# Patient Record
Sex: Female | Born: 1940 | Race: Black or African American | Hispanic: No | Smoking: Never smoker
Health system: Southern US, Community
[De-identification: ages and names within clinical notes are randomized; demographics above are authoritative.]

## PROBLEM LIST (undated history)

## (undated) DIAGNOSIS — F4310 Post-traumatic stress disorder, unspecified: Secondary | ICD-10-CM

## (undated) DIAGNOSIS — I10 Essential (primary) hypertension: Secondary | ICD-10-CM

## (undated) DIAGNOSIS — E785 Hyperlipidemia, unspecified: Secondary | ICD-10-CM

## (undated) DIAGNOSIS — J45909 Unspecified asthma, uncomplicated: Secondary | ICD-10-CM

## (undated) DIAGNOSIS — J449 Chronic obstructive pulmonary disease, unspecified: Secondary | ICD-10-CM

## (undated) DIAGNOSIS — F32A Depression, unspecified: Secondary | ICD-10-CM

## (undated) DIAGNOSIS — F329 Major depressive disorder, single episode, unspecified: Secondary | ICD-10-CM

## (undated) HISTORY — PX: BACK SURGERY: SHX140

## (undated) HISTORY — PX: ABDOMINAL HYSTERECTOMY: SHX81

## (undated) HISTORY — PX: OTHER SURGICAL HISTORY: SHX169

## (undated) HISTORY — PX: CHOLECYSTECTOMY: SHX55

---

## 1998-04-24 ENCOUNTER — Encounter: Admission: RE | Admit: 1998-04-24 | Discharge: 1998-07-23 | Payer: Self-pay | Admitting: Specialist

## 1998-07-11 ENCOUNTER — Ambulatory Visit (HOSPITAL_COMMUNITY): Admission: RE | Admit: 1998-07-11 | Discharge: 1998-07-11 | Payer: Self-pay | Admitting: *Deleted

## 2004-02-01 ENCOUNTER — Emergency Department (HOSPITAL_COMMUNITY): Admission: EM | Admit: 2004-02-01 | Discharge: 2004-02-01 | Payer: Self-pay | Admitting: Family Medicine

## 2004-02-10 ENCOUNTER — Encounter: Admission: RE | Admit: 2004-02-10 | Discharge: 2004-02-10 | Payer: Self-pay | Admitting: Family Medicine

## 2004-07-13 ENCOUNTER — Encounter: Admission: RE | Admit: 2004-07-13 | Discharge: 2004-07-13 | Payer: Self-pay | Admitting: Gastroenterology

## 2005-10-28 ENCOUNTER — Ambulatory Visit (HOSPITAL_BASED_OUTPATIENT_CLINIC_OR_DEPARTMENT_OTHER): Admission: RE | Admit: 2005-10-28 | Discharge: 2005-10-28 | Payer: Self-pay | Admitting: Orthopedic Surgery

## 2006-06-23 ENCOUNTER — Emergency Department (HOSPITAL_COMMUNITY): Admission: EM | Admit: 2006-06-23 | Discharge: 2006-06-23 | Payer: Self-pay | Admitting: Family Medicine

## 2009-10-20 ENCOUNTER — Encounter (HOSPITAL_COMMUNITY): Admission: RE | Admit: 2009-10-20 | Discharge: 2010-01-02 | Payer: Self-pay | Admitting: Cardiology

## 2010-02-18 ENCOUNTER — Emergency Department (HOSPITAL_COMMUNITY): Admission: EM | Admit: 2010-02-18 | Discharge: 2010-02-19 | Payer: Self-pay | Admitting: Emergency Medicine

## 2010-11-20 NOTE — Op Note (Signed)
NAME:  Adrienne Rogers, Adrienne Rogers           ACCOUNT NO.:  [id number]   MEDICAL RECORD NO.:  [id number]          PATIENT TYPE:  AMB   LOCATION:  DSC                          FACILITY:  [facility]   PHYSICIAN:  Nadara Mustard, MD     DATE OF BIRTH:  1940-10-22   DATE OF PROCEDURE:  10/28/2005  DATE OF DISCHARGE:                                 OPERATIVE REPORT   PREOPERATIVE DIAGNOSIS:  Internal derangement left knee.   POSTOPERATIVE DIAGNOSES:  1.  Degenerative tearing of the medial and lateral meniscus.  2.  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   PROCEDURE:  1.  Partial medial and lateral meniscectomies.  2.  Abrasion chondroplasty of the medial femoral condyle, patella and      trochlea.   SURGEON:  Lajoyce Corners.   ANESTHESIA:  Knee block.   ESTIMATED BLOOD LOSS:  Minimal.   ANTIBIOTICS:  None.   DRAINS:  None.   COMPLICATIONS:  None.   DISPOSITION:  To PACU in stable condition.   INDICATIONS FOR PROCEDURE:  The patient is a 70 year old woman with  mechanical symptoms of her left knee.  She has failed conservative care, has  pain with activities of daily living and presents at this time for  arthroscopic intervention.  Risks and benefits were discussed including  infection, neurovascular injury, persistent pain, need for additional  surgery.  The patient states she understands and wishes to proceed at this  time.   DESCRIPTION OF PROCEDURE:  The patient was brought to OR room 5 after  undergoing a knee block.  After adequate level of anesthesia obtained, the  patient's was left lower extremity was prepped using DuraPrep and draped  into a sterile field.  The scope was inserted through the inferolateral  portal and a inferomedial working portal was established.  On examination  the patient initially had a significant synovitis.  This was debrided.  She  had a large degenerative tear of the posterior horn of the medial meniscus.  With valgus  stress on the knee, a biter and shaver were used to debride the  lateral meniscal tear.  The patient also had a large osteochondral defect,  and the ring curette was used to debride the osteochondral defect of the  medial femoral condyle.  Examination of the notch showed an intact ACL.  Examination of the lateral joint in the figure-4 position also showed large  degenerative tearing of the lateral meniscus.  This was debrided with the  shaver.  There were no articular defects of the lateral femoral condyle or  lateral tibial plateau.  Examination of the patellofemoral joint with the  knee extended also showed an osteochondral defect of the patella and  trochlea.  Using the shaver, abrasion chondroplasty was performed to debride  the osteochondral defect of the patella and trochlea.  Examination of medial  and lateral gutters showed there to be no loose bodies.  A survey was then  again performed of all three compartments.  There were no loose bodies.  The  instruments were removed.  The portals were closed  using 3-0 nylon, and the  joint was infused with total of 20 cc of 0.5% Marcaine plain and 4 mg of  morphine.  The wounds were covered with Adaptic, orthopedic sponges, Webril,  ABD and Coban dressing.  The patient was then taken to PACU in stable  condition.   The plan is for discharge to home.  Discontinue the dressing in two days.  Weightbearing as tolerated.  Followup in office in two weeks.      Nadara Mustard, MD  Electronically Signed     MVD/MEDQ  D:  10/28/2005  T:  10/29/2005  Job:  [id number]

## 2011-04-01 ENCOUNTER — Other Ambulatory Visit: Payer: Self-pay | Admitting: Family Medicine

## 2011-04-01 DIAGNOSIS — IMO0002 Reserved for concepts with insufficient information to code with codable children: Secondary | ICD-10-CM

## 2011-04-23 IMAGING — CR DG CERVICAL SPINE COMPLETE 4+V
6 series · 6 of 6 positions shown · non-contrast
Comparison: None.

CLINICAL DATA: Fall.  Neck pain.

CERVICAL SPINE - COMPLETE 4+ VIEW

[w c-spine lat]
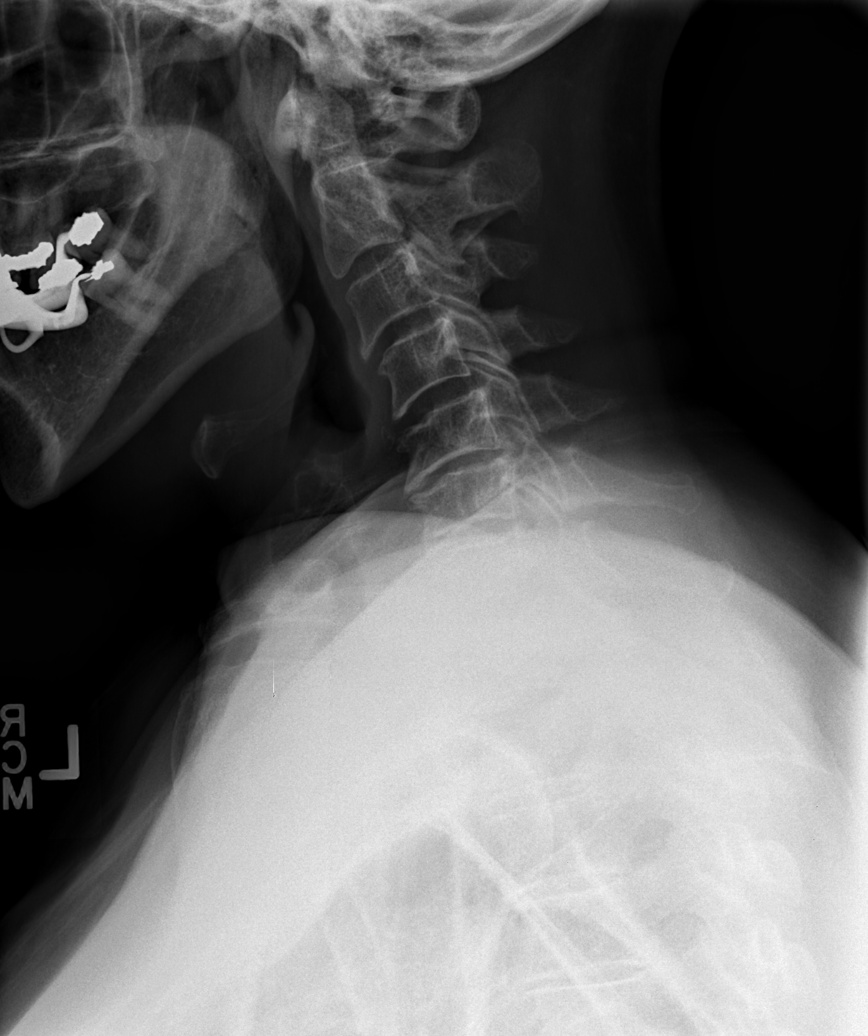

[w c-spine oblique * (1 of 2)]
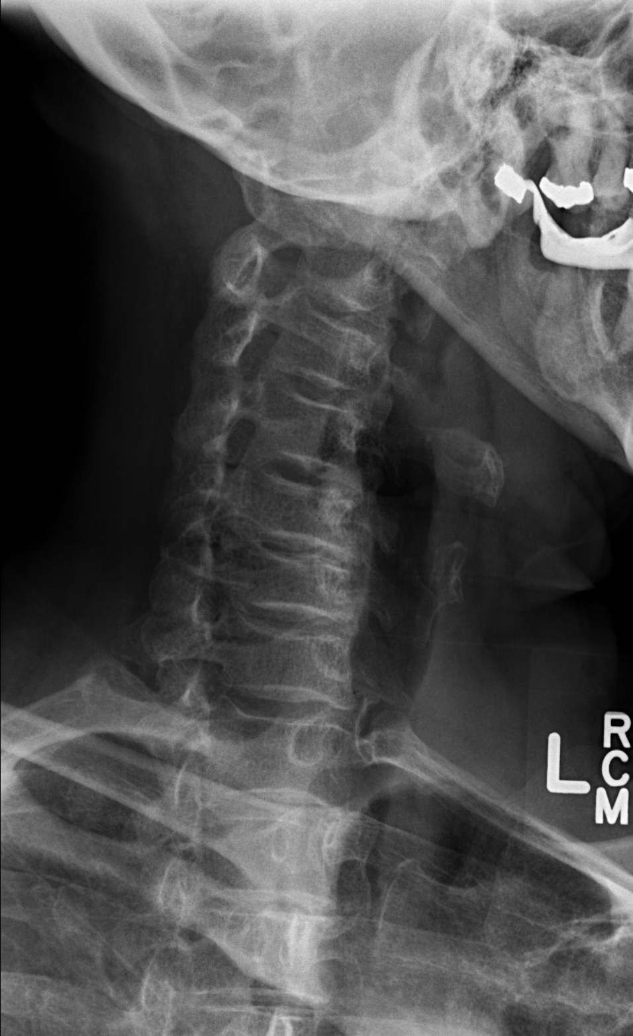

[w c-spine oblique * (2 of 2)]
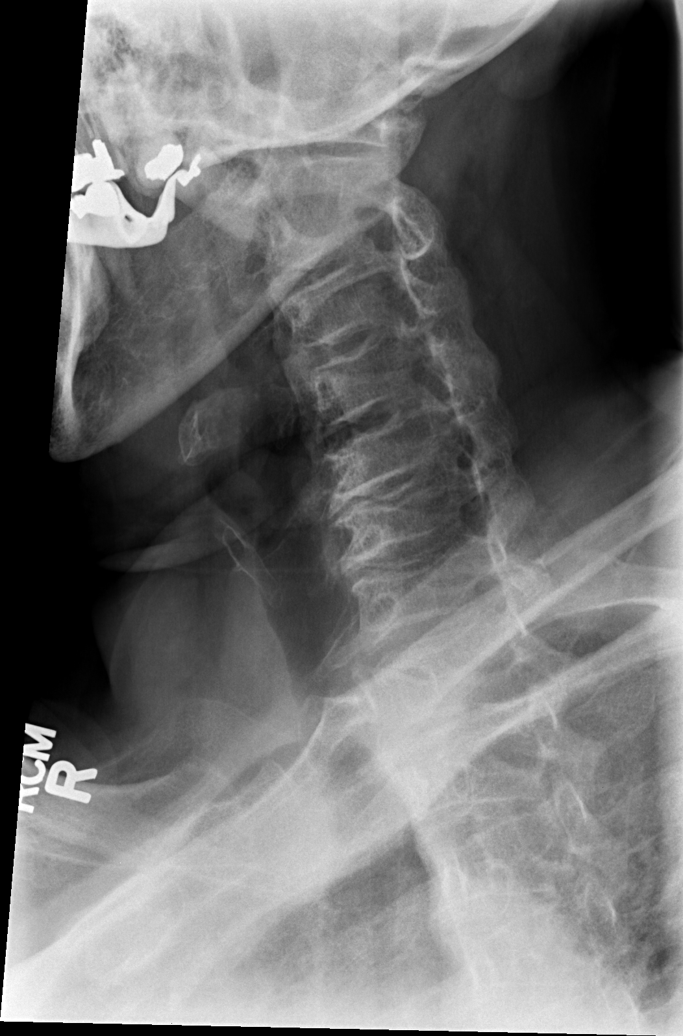

[w c-spine a.p.]
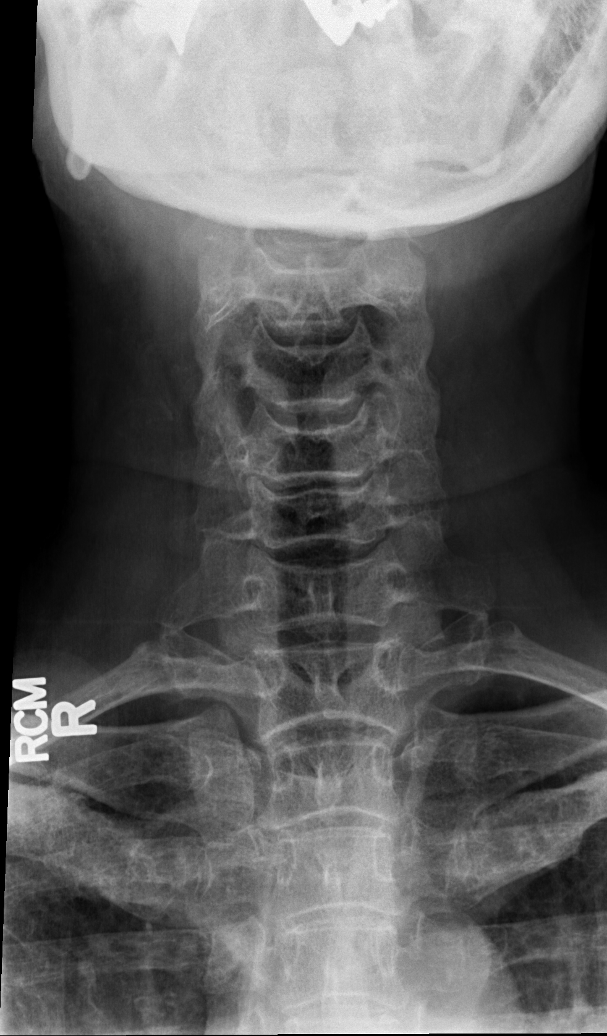

[w c-spine odontoid]
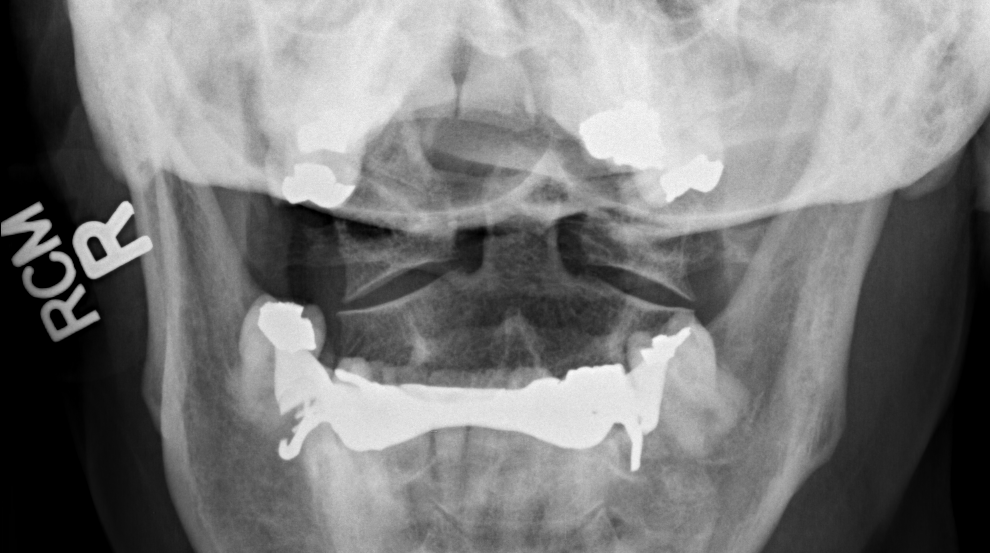

[w swimmers view *]
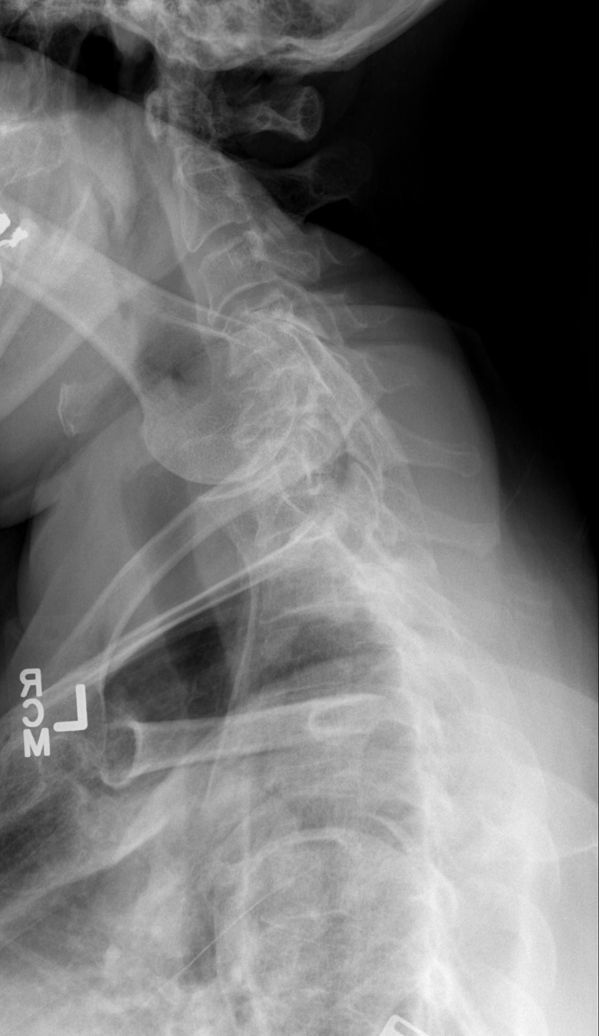

[6 of 6 positions shown; findings below may reference images not displayed]

FINDINGS: Loss of the normal cervical lordosis with reversal
centered around C5-C6.  Mid to lower cervical spondylosis is
present.  Minimal anterolisthesis of C4 on C5, measuring between 1
mm and 2 mm.  The cervicothoracic junction is inadequately
visualized.  Odontoid appears normal.  Bilateral uncovertebral
spurring most pronounced at C5-C6 with associated foraminal
stenosis.
IMPRESSION: 1.  Multilevel cervical spondylosis.
2.  Minimal anterolisthesis of C4 on C5 which may be degenerative.
Consider follow-up flexion and extension views.
3.  Inadequate visualization of the cervicothoracic junction.

## 2011-04-23 IMAGING — CR DG FOREARM 2V*L*
2 series · 2 of 2 positions shown · non-contrast
Comparison: Hand films same day.

CLINICAL DATA: Fall.  Left arm injury.  Trauma.

LEFT FOREARM - 2 VIEW

[x forearm ap left]
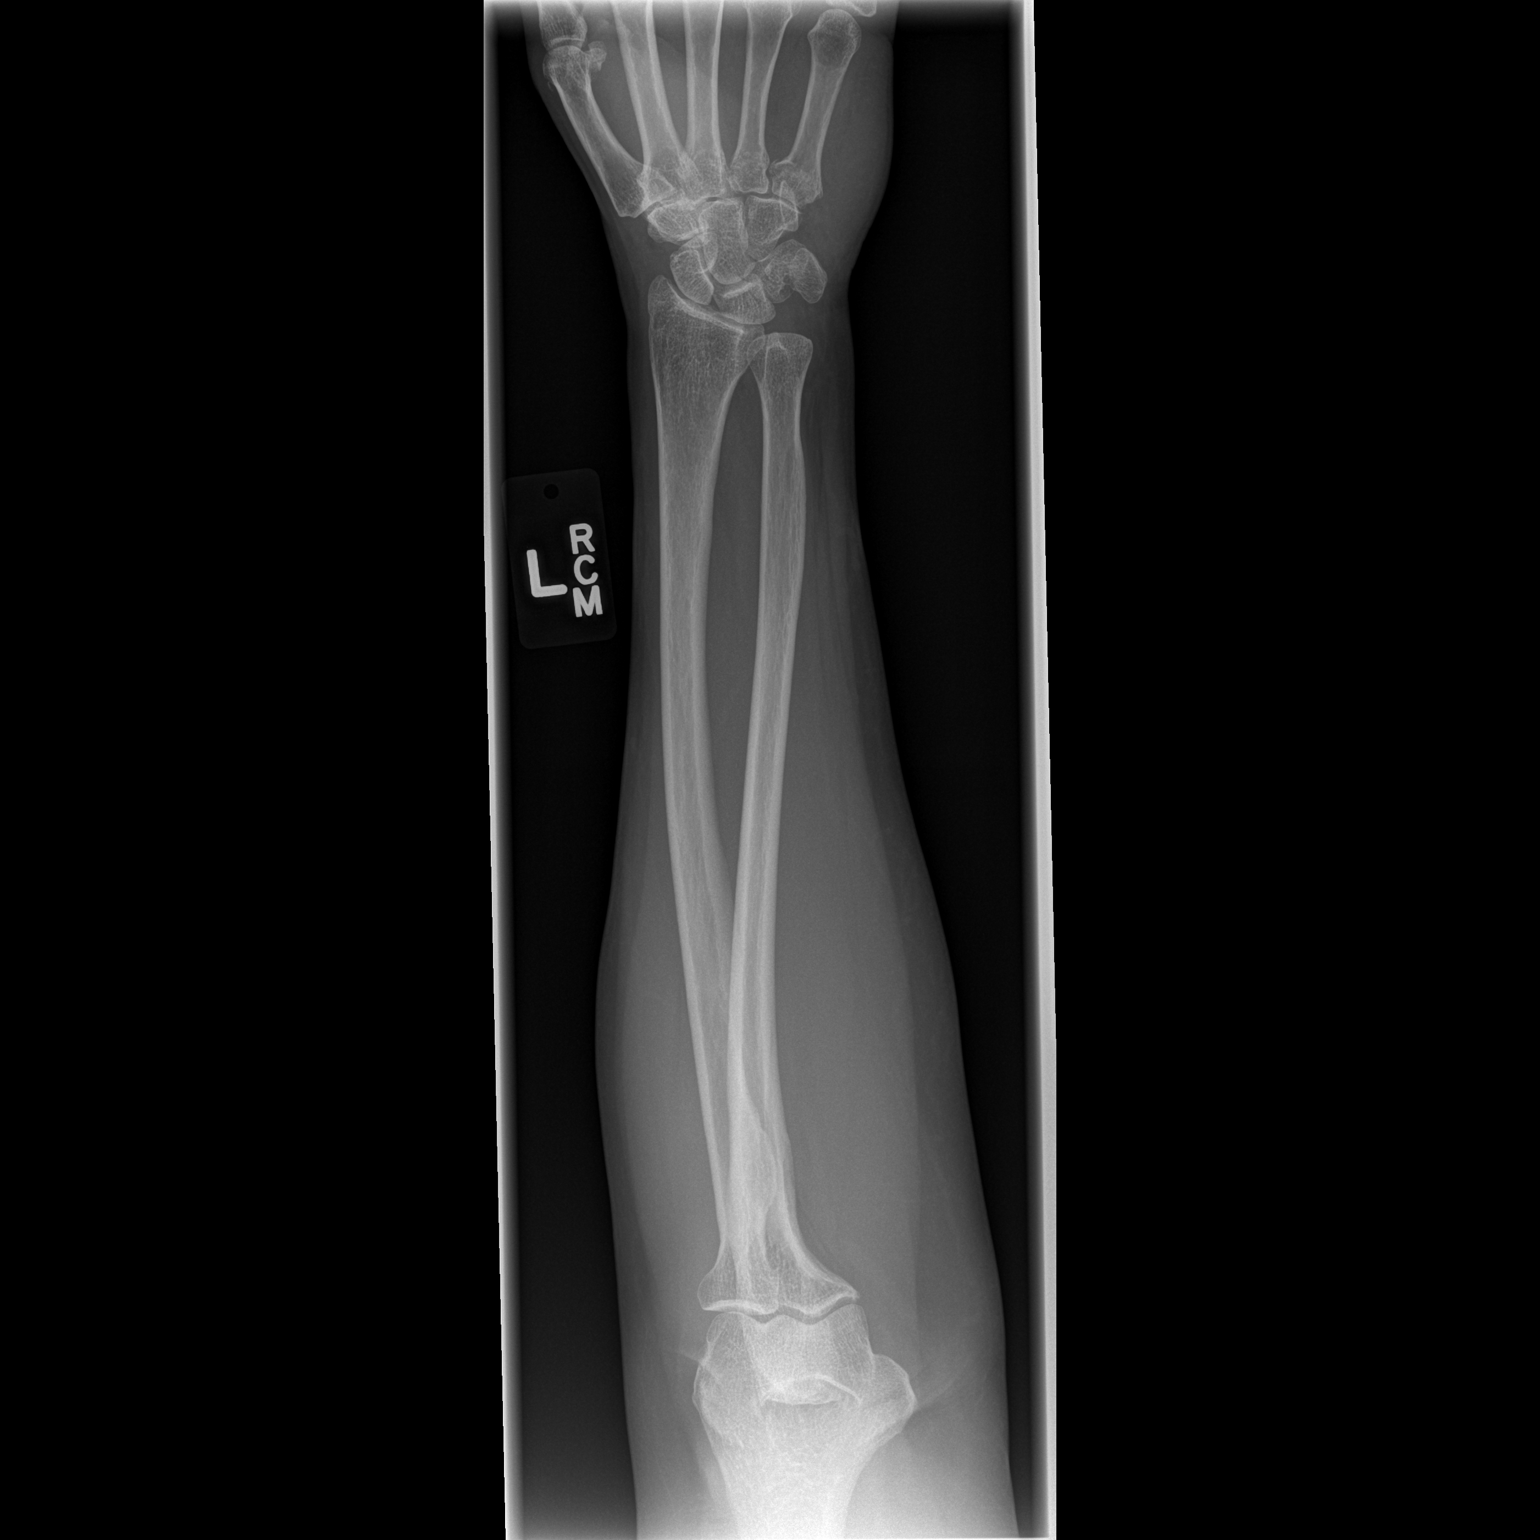

[x forearm lat left]
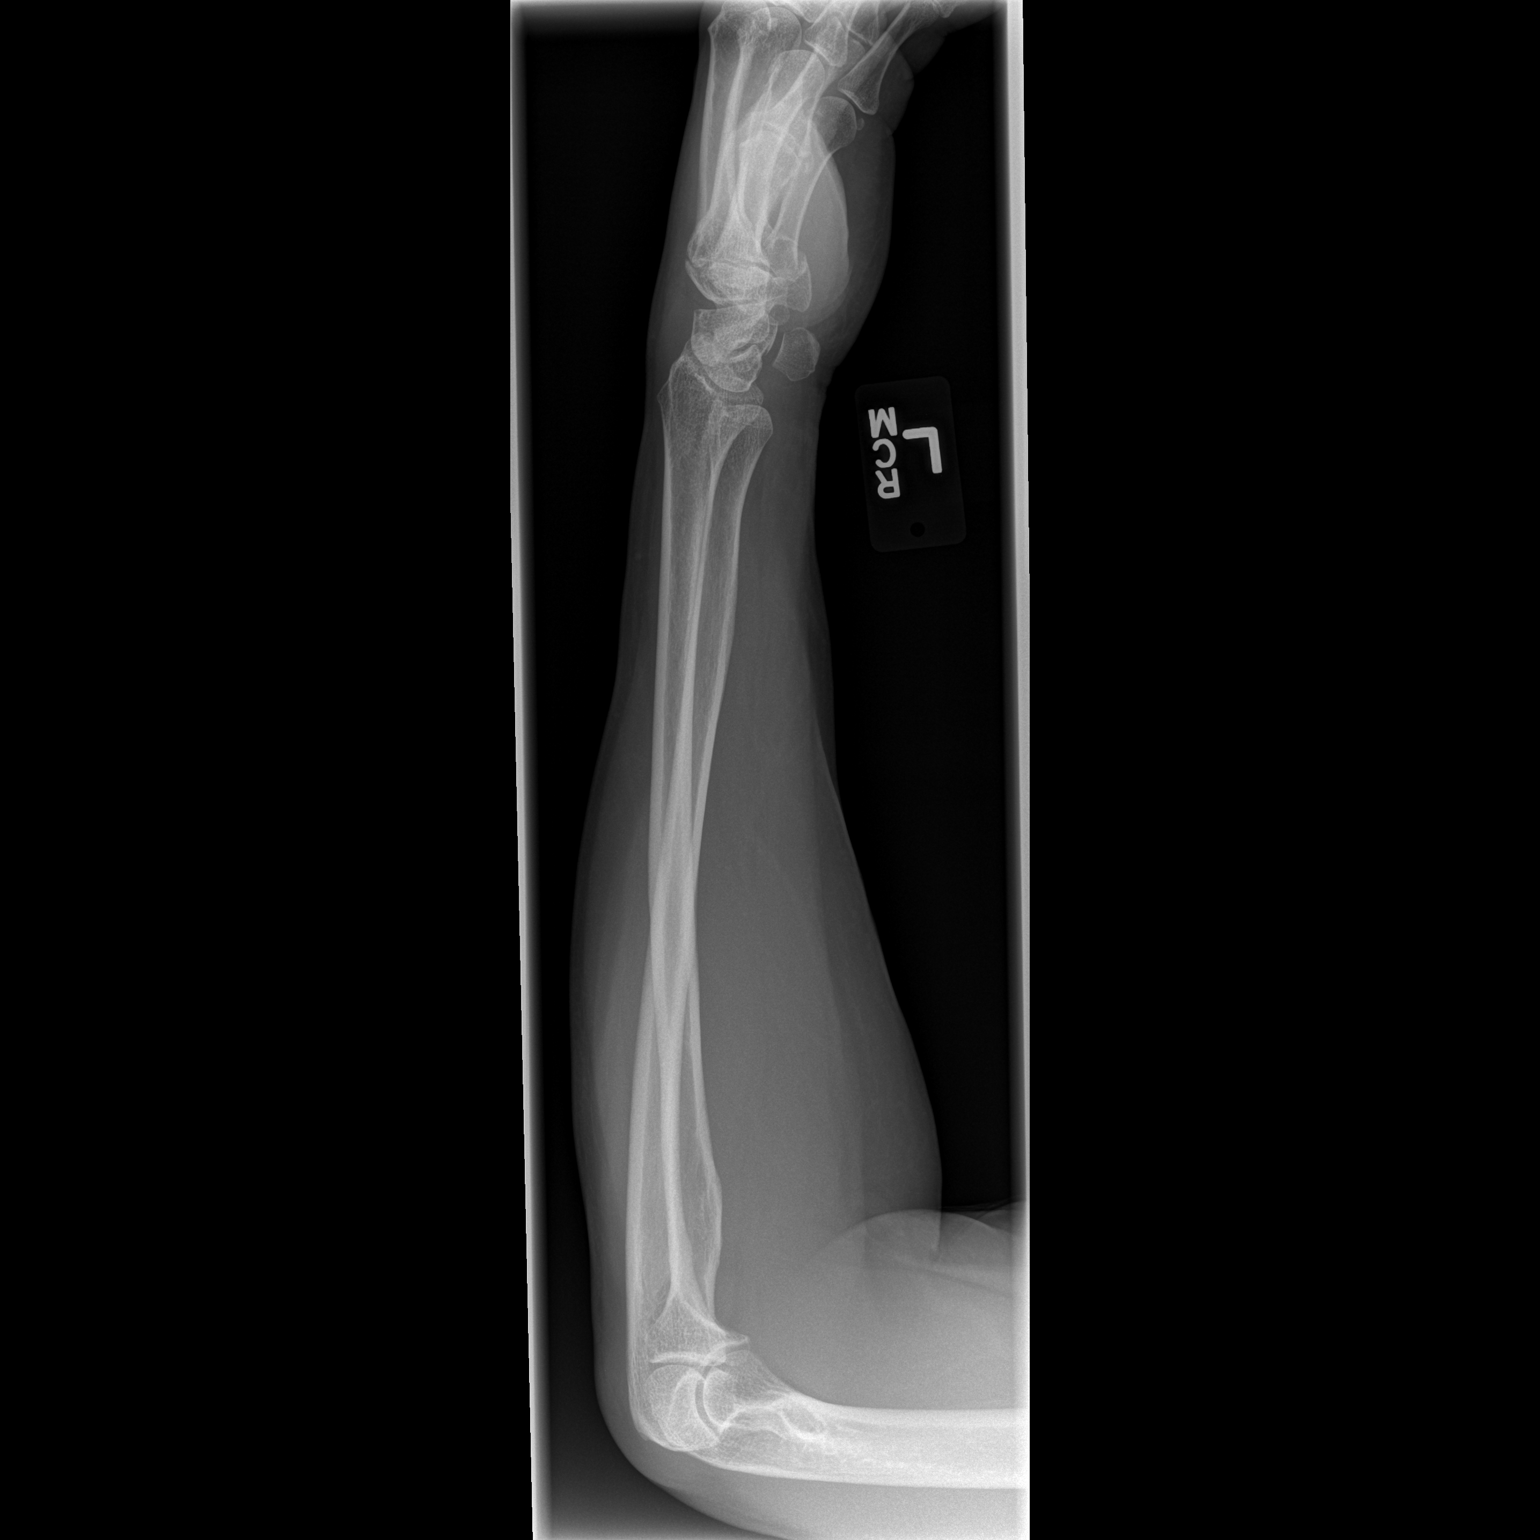

[2 of 2 positions shown; findings below may reference images not displayed]

FINDINGS: Left radius and ulna intact.  Soft tissues appear within
normal limits.  There is a nondisplaced fracture of the base of the
left fifth metacarpal extending into the intermetacarpal joint.
IMPRESSION: Intact left radius and ulna.  See hand films.

## 2011-10-27 ENCOUNTER — Ambulatory Visit (HOSPITAL_COMMUNITY)
Admission: RE | Admit: 2011-10-27 | Discharge: 2011-10-27 | Disposition: A | Discharge status: Home / Self Care | Payer: Medicare Other | Source: Ambulatory Visit | Attending: Family Medicine | Admitting: Family Medicine

## 2011-10-27 DIAGNOSIS — H61899 Other specified disorders of external ear, unspecified ear: Secondary | ICD-10-CM

## 2011-10-27 DIAGNOSIS — R52 Pain, unspecified: Secondary | ICD-10-CM

## 2011-10-27 DIAGNOSIS — M79609 Pain in unspecified limb: Secondary | ICD-10-CM

## 2011-10-27 DIAGNOSIS — M7989 Other specified soft tissue disorders: Secondary | ICD-10-CM

## 2011-10-27 NOTE — Progress Notes (Signed)
*  PRELIMINARY RESULTS* Vascular Ultrasound Right Lower Extremity Venous duplex has been completed.  Preliminary findings: Right= No evidence of DVT or baker's cyst.  Farrel Demark, RDMS 10/27/2011, 3:08 PM

## 2013-05-11 ENCOUNTER — Other Ambulatory Visit (HOSPITAL_COMMUNITY): Payer: Self-pay | Admitting: Cardiology

## 2013-05-11 DIAGNOSIS — R079 Chest pain, unspecified: Secondary | ICD-10-CM

## 2013-05-14 ENCOUNTER — Other Ambulatory Visit: Payer: Self-pay

## 2013-05-14 ENCOUNTER — Ambulatory Visit (HOSPITAL_COMMUNITY)
Admission: RE | Admit: 2013-05-14 | Discharge: 2013-05-14 | Disposition: A | Discharge status: Home / Self Care | Payer: Medicare Other | Source: Ambulatory Visit | Attending: Cardiology | Admitting: Cardiology

## 2013-05-14 ENCOUNTER — Encounter (HOSPITAL_COMMUNITY)
Admission: RE | Admit: 2013-05-14 | Discharge: 2013-05-14 | Disposition: A | Discharge status: Home / Self Care | Payer: Medicare Other | Source: Ambulatory Visit | Attending: Cardiology | Admitting: Cardiology

## 2013-05-14 VITALS — BP 138/80 | HR 71

## 2013-05-14 DIAGNOSIS — R079 Chest pain, unspecified: Secondary | ICD-10-CM

## 2013-05-14 MED ORDER — TECHNETIUM TC 99M SESTAMIBI GENERIC - CARDIOLITE
30.0000 | Freq: Once | INTRAVENOUS | Status: AC | PRN
  Administered 2013-05-14: 30 via INTRAVENOUS

## 2013-05-14 MED ORDER — REGADENOSON 0.4 MG/5ML IV SOLN: 0.4000 mg | Freq: Once | INTRAVENOUS | Status: DC

## 2013-05-14 MED ORDER — REGADENOSON 0.4 MG/5ML IV SOLN
INTRAVENOUS | Status: AC
  Administered 2013-05-14: 0.4 mg
  Filled 2013-05-14: qty 5

## 2013-05-14 MED ORDER — TECHNETIUM TC 99M SESTAMIBI GENERIC - CARDIOLITE
10.0000 | Freq: Once | INTRAVENOUS | Status: AC | PRN
  Administered 2013-05-14: 10 via INTRAVENOUS

## 2014-10-25 ENCOUNTER — Ambulatory Visit (HOSPITAL_BASED_OUTPATIENT_CLINIC_OR_DEPARTMENT_OTHER): Payer: Medicare Other | Attending: Internal Medicine | Admitting: Radiology

## 2014-10-25 VITALS — Ht 65.0 in | Wt 175.0 lb

## 2014-10-25 DIAGNOSIS — G4730 Sleep apnea, unspecified: Secondary | ICD-10-CM | POA: Insufficient documentation

## 2014-10-25 DIAGNOSIS — G4710 Hypersomnia, unspecified: Secondary | ICD-10-CM | POA: Diagnosis present

## 2014-10-25 DIAGNOSIS — R0683 Snoring: Secondary | ICD-10-CM | POA: Insufficient documentation

## 2014-10-26 DIAGNOSIS — G4730 Sleep apnea, unspecified: Secondary | ICD-10-CM

## 2014-10-26 NOTE — Sleep Study (Signed)
   NAME: Adrienne Rogers DATE OF BIRTH:  [birth year] MEDICAL RECORD NUMBER [id number]  LOCATION: [facility]  PHYSICIAN: YOUNG,CLINTON D  DATE OF STUDY: 10/25/2014  SLEEP STUDY TYPE: Nocturnal Polysomnogram               REFERRING PHYSICIAN: Jetty DuhamelYoung, Clinton D, MD  INDICATION FOR STUDY: Hypersomnia with sleep apnea  EPWORTH SLEEPINESS SCORE:   HEIGHT: 5\' 5"  (165.1 cm) 6/24 WEIGHT: 175 lb (79.379 kg)    Body mass index is 29.12 kg/(m^2).  NECK SIZE: 13.5 in.  MEDICATIONS: Charted for review  SLEEP ARCHITECTURE: Total sleep time 308.5 minutes with sleep efficiency 81.6%. Stage I was 10.7%, stage II 88.8%, stage III absent, REM 0.5% of total sleep time. Sleep latency 22 minutes, REM latency 285.5 minutes, awake after sleep onset 46 minutes, arousal index 18.3, bedtime medication atorvastatin, prazosin  RESPIRATORY DATA: Apnea hypopnea index (AHI) 2.3 per hour. 12 total events scored including one obstructive apnea, 1 central apnea, 10 hypopneas. All events were while nonsupine. REM AHI 0. CPAP titration was not done.  OXYGEN DATA: Moderate to loud snoring with oxygen desaturation to a nadir of 89% and mean saturation 94.1% on room air  CARDIAC DATA: Sinus rhythm with PACs  MOVEMENT/PARASOMNIA: No significant movement disturbance, bathroom 1  IMPRESSION/ RECOMMENDATION:   1) Unremarkable sleep architecture except for less time in rem than expected 2) Occasional respiratory event with sleep disturbance, within normal limits. AHI 2.3 per hour. The normal range for adults is an AHI from 0-5 events per hour. Moderate to loud snoring with oxygen desaturation to a nadir of 89% and mean saturation 94.1% on room air.   Waymon BudgeYOUNG,CLINTON D Diplomate, American Board of Sleep Medicine  ELECTRONICALLY SIGNED ON:  10/26/2014, 4:10 PM [facility] PH: [phone]   FX: [phone] ACCREDITED BY THE AMERICAN ACADEMY OF SLEEP MEDICINE

## 2015-10-22 ENCOUNTER — Other Ambulatory Visit: Payer: Self-pay | Admitting: Gastroenterology

## 2015-10-22 DIAGNOSIS — R1310 Dysphagia, unspecified: Secondary | ICD-10-CM

## 2015-10-30 ENCOUNTER — Ambulatory Visit
Admission: RE | Admit: 2015-10-30 | Discharge: 2015-10-30 | Disposition: A | Discharge status: Home / Self Care | Payer: Medicare Other | Source: Ambulatory Visit | Attending: Gastroenterology | Admitting: Gastroenterology

## 2015-10-30 DIAGNOSIS — R1310 Dysphagia, unspecified: Secondary | ICD-10-CM

## 2016-06-03 ENCOUNTER — Ambulatory Visit: Payer: Self-pay | Admitting: Allergy

## 2016-06-14 ENCOUNTER — Ambulatory Visit (INDEPENDENT_AMBULATORY_CARE_PROVIDER_SITE_OTHER): Payer: Self-pay | Admitting: Orthopedic Surgery

## 2016-06-16 ENCOUNTER — Encounter (INDEPENDENT_AMBULATORY_CARE_PROVIDER_SITE_OTHER): Payer: Self-pay

## 2016-06-16 ENCOUNTER — Ambulatory Visit (INDEPENDENT_AMBULATORY_CARE_PROVIDER_SITE_OTHER): Payer: Self-pay

## 2016-06-16 ENCOUNTER — Ambulatory Visit (INDEPENDENT_AMBULATORY_CARE_PROVIDER_SITE_OTHER): Payer: Medicare Other

## 2016-06-16 ENCOUNTER — Ambulatory Visit (INDEPENDENT_AMBULATORY_CARE_PROVIDER_SITE_OTHER): Payer: Medicare Other | Admitting: Orthopedic Surgery

## 2016-06-16 DIAGNOSIS — M25561 Pain in right knee: Secondary | ICD-10-CM

## 2016-06-16 DIAGNOSIS — G8929 Other chronic pain: Secondary | ICD-10-CM | POA: Diagnosis not present

## 2016-06-16 DIAGNOSIS — M5441 Lumbago with sciatica, right side: Secondary | ICD-10-CM | POA: Diagnosis not present

## 2016-06-16 MED ORDER — LIDOCAINE HCL 1 % IJ SOLN
5.0000 mL | INTRAMUSCULAR | Status: AC | PRN
  Administered 2016-06-16: 5 mL

## 2016-06-16 MED ORDER — METHYLPREDNISOLONE ACETATE 40 MG/ML IJ SUSP
40.0000 mg | INTRAMUSCULAR | Status: AC | PRN
  Administered 2016-06-16: 40 mg via INTRA_ARTICULAR

## 2016-06-16 NOTE — Progress Notes (Signed)
Office Visit Note   Patient: Adrienne Rogers           Date of Birth: [birth year]           MRN: [id number] Visit Date: 06/16/2016              Requested by: Devra Doppamieka Howell, MD [address] [place], [place] [id number] PCP: Devra DoppHOWELL, TAMIEKA, MD   Assessment & Plan: Visit Diagnoses:  1. Chronic pain of right knee   2. Chronic right-sided low back pain with right-sided sciatica     Plan: Right knee was injected from the anterior medial portal. Plan to follow-up when she returns to town from [place] in February. Discussed that if she has some interval relief we could try a hyaluronic acid injection. If she has no relief discussed the possibility of total knee replacement. Do not feel that arthroscopic debridement would be beneficial. 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  Follow-Up Instructions: Return in about 4 weeks (around 07/14/2016).   Orders:  Orders Placed This Encounter  Procedures  . XR Knee 1-2 Views Right  . XR Lumbar Spine 2-3 Views   No orders of the defined types were placed in this encounter.     Procedures: Large Joint Inj Date/Time: 06/16/2016 4:04 PM Performed by: DUDA, MARCUS V Authorized by: Nadara MustardUDA, MARCUS V   Consent Given by:  Patient Site marked: the procedure site was marked   Timeout: prior to procedure the correct patient, procedure, and site was verified   Indications:  Pain and diagnostic evaluation Location:  Knee Site:  R knee Prep: patient was prepped and draped in usual sterile fashion   Needle Size:  22 G Needle Length:  1.5 inches Approach:  Anteromedial Ultrasound Guidance: No   Fluoroscopic Guidance: No   Arthrogram: No   Medications:  5 mL lidocaine 1 %; 40 mg methylPREDNISolone acetate 40 MG/ML Aspiration Attempted: No   Patient tolerance:  Patient tolerated the procedure well with no immediate complications     Clinical Data: No  additional findings.   Subjective: Chief Complaint  Patient presents with  . Right Knee - Pain    Hx of right knee scope and deb 12/2012  . Right Hip - Pain    Right knee anterior pain and swelling " for years" states that it hurts sometimes when she is walking. Does fell like it will give way at times and had falls last year but nothing recently. Patient complains of right hip pain and states that it hurts from the stomach around to her back without groin pain. Has had some radicular pain down to her knee. No numbness or tingling. And does notice pain with position change. "want to have this checked out" she does take Aleve prn for this.    Review of Systems   Objective: Vital Signs: There were no vitals taken for this visit.  Physical Exam examination patient is alert oriented no adenopathy well-dressed normal affect normal respiratory effort she has an antalgic gait varus alignment of the right knee worse than the left. Examination she has a negative straight leg raise bilaterally. She is crepitation range of motion of both knees classic resector stable there is no effusion no redness no cellulitis. No focal motor weakness in either lower extremity.  Ortho Exam  Specialty Comments:  No specialty comments available.  Imaging: Xr Knee 1-2 Views Right  Result Date: 06/16/2016 2 view radiographs the right  knee shows tricompartmental arthritic changes with periarticular bony spurs joint space narrowing with bone-on-bone contact the medial joint line. She has similar arthritic changes on the left knee. She has varus alignment of the right knee with subluxation.  Xr Lumbar Spine 2-3 Views  Result Date: 06/16/2016 2 view radiographs lumbar spine shows a grade 1 spondylolisthesis at L4-5 she has some calcification of the aorta without aneurysm there is degenerative disc disease of the lumbar spine with periarticular bony spurs.    PMFS History: Patient Active Problem List    Diagnosis Date Noted  . Chronic pain of right knee 06/16/2016  . Chronic right-sided low back pain with right-sided sciatica 06/16/2016   No past medical history on file.  No family history on file.  No past surgical history on file. Social History   Occupational History  . Not on file.   Social History Main Topics  . Smoking status: Not on file  . Smokeless tobacco: Not on file  . Alcohol use Not on file  . Drug use: Unknown  . Sexual activity: Not on file

## 2016-08-16 ENCOUNTER — Telehealth (INDEPENDENT_AMBULATORY_CARE_PROVIDER_SITE_OTHER): Payer: Self-pay

## 2016-08-16 ENCOUNTER — Other Ambulatory Visit (INDEPENDENT_AMBULATORY_CARE_PROVIDER_SITE_OTHER): Payer: Self-pay

## 2016-08-16 ENCOUNTER — Ambulatory Visit (INDEPENDENT_AMBULATORY_CARE_PROVIDER_SITE_OTHER): Payer: Medicare Other | Admitting: Orthopedic Surgery

## 2016-08-16 DIAGNOSIS — M25561 Pain in right knee: Secondary | ICD-10-CM | POA: Diagnosis not present

## 2016-08-16 DIAGNOSIS — M1712 Unilateral primary osteoarthritis, left knee: Secondary | ICD-10-CM | POA: Diagnosis not present

## 2016-08-16 DIAGNOSIS — G8929 Other chronic pain: Secondary | ICD-10-CM | POA: Diagnosis not present

## 2016-08-16 NOTE — Telephone Encounter (Signed)
Obtain pre cert for monovisc injection

## 2016-08-16 NOTE — Progress Notes (Addendum)
   Office Visit Note   Patient: Adrienne Rogers           Date of Birth: 1940-12-06           MRN: [id number] Visit Date: 08/16/2016              Requested by: Devra Doppamieka Howell, MD [address] [place], [place] [phone] PCP: Devra DoppHOWELL, TAMIEKA, MD  Chief Complaint  Patient presents with  . Right Knee - Pain    HPI: The patient is a 76 year old woman seen today in follow up for right knee anterior pain and swelling "on and on for years."States that it is made worse with ambulation.  Notices some grinding. Denies mechanical symptoms. does take Aleve prn for this with good relief.  Last had a depomedrol injection in December. This provided relief for a few weeks. Is interested in monovisc injections today. Is also interested in physical therapy, specifically pool therapy.    Assessment & Plan: Visit Diagnoses:  1. Chronic pain of right knee   2. Primary osteoarthritis of left knee     Plan: [facility] her [facility] Will begin prior auth for the Monovisc injection. She will be called and offered injection once approved. Continue with Aleve prn.   Follow-Up Instructions: Return for monovisc, will call to set up appt..   Physical Exam  Constitutional: Appears well-developed.  Head: Normocephalic.  Eyes: EOM are normal.  Neck: Normal range of motion.  Cardiovascular: Normal rate.   Pulmonary/Chest: Effort normal.  Neurological: Is alert.  Skin: Skin is warm.  Psychiatric: Has a normal mood and affect.  Right Knee Exam   Tenderness  The patient is experiencing no tenderness.     Range of Motion  The patient has normal right knee ROM.  Tests  Varus: negative Valgus: negative  Other  Erythema: absent Swelling: mild Other tests: no effusion present  Comments:  Crepitation with ROM       Imaging: No results found.  Orders:  No orders of the defined types were placed in this encounter.  No  orders of the defined types were placed in this encounter.    Procedures: No procedures performed  Clinical Data: No additional findings.  Subjective: Review of Systems  Objective: Vital Signs: There were no vitals taken for this visit.  Specialty Comments:  No specialty comments available.  PMFS History: Patient Active Problem List   Diagnosis Date Noted  . Primary osteoarthritis of left knee 08/16/2016  . Chronic pain of right knee 06/16/2016  . Chronic right-sided low back pain with right-sided sciatica 06/16/2016   No past medical history on file.  No family history on file.  No past surgical history on file. Social History   Occupational History  . Not on file.   Social History Main Topics  . Smoking status: Not on file  . Smokeless tobacco: Not on file  . Alcohol use Not on file  . Drug use: Unknown  . Sexual activity: Not on file

## 2016-08-17 NOTE — Telephone Encounter (Signed)
Submitted for auth today.

## 2016-08-18 ENCOUNTER — Other Ambulatory Visit (INDEPENDENT_AMBULATORY_CARE_PROVIDER_SITE_OTHER): Payer: Self-pay

## 2016-08-18 DIAGNOSIS — M25561 Pain in right knee: Principal | ICD-10-CM

## 2016-08-18 DIAGNOSIS — G8929 Other chronic pain: Secondary | ICD-10-CM

## 2016-08-18 NOTE — Progress Notes (Signed)
appt 08/31/16

## 2016-08-31 ENCOUNTER — Ambulatory Visit (INDEPENDENT_AMBULATORY_CARE_PROVIDER_SITE_OTHER): Payer: Medicare Other | Admitting: Family

## 2017-04-07 ENCOUNTER — Ambulatory Visit (INDEPENDENT_AMBULATORY_CARE_PROVIDER_SITE_OTHER): Payer: Medicare Other | Admitting: Family

## 2017-04-07 DIAGNOSIS — M1712 Unilateral primary osteoarthritis, left knee: Secondary | ICD-10-CM | POA: Diagnosis not present

## 2017-04-07 DIAGNOSIS — G8929 Other chronic pain: Secondary | ICD-10-CM

## 2017-04-07 DIAGNOSIS — M25561 Pain in right knee: Secondary | ICD-10-CM

## 2017-04-07 MED ORDER — LIDOCAINE HCL 1 % IJ SOLN
5.0000 mL | INTRAMUSCULAR | Status: AC | PRN
  Administered 2017-04-07: 5 mL

## 2017-04-07 MED ORDER — METHYLPREDNISOLONE ACETATE 40 MG/ML IJ SUSP
40.0000 mg | INTRAMUSCULAR | Status: AC | PRN
  Administered 2017-04-07: 40 mg via INTRA_ARTICULAR

## 2017-04-07 NOTE — Progress Notes (Signed)
Office Visit Note   Patient: Adrienne Rogers           Date of Birth: 07/15/1940           MRN: [id number] Visit Date: 04/07/2017              Requested by: Devra Dopp, MD [address] [place], [place] [address] PCP: Devra Dopp, MD  Chief Complaint  Patient presents with  . Right Knee - Pain      HPI: The patient is a 76 year old woman who presents today complaining of bilateral chronic knee pain. Has a history of osteoarthritis bilaterally. Last had Depo-Medrol injection in the right knee in February of last year states her knee just started to bother her about 10 days ago. No mechanical symptoms just pain and swelling. Left knee bothering her as well. She's been active with water aerobics  Assessment & Plan: Visit Diagnoses:  1. Chronic pain of right knee   2. Primary osteoarthritis of left knee     Plan: Depo-Medrol injection bilateral knees today. She'll follow-up in office as needed  Follow-Up Instructions: Return if symptoms worsen or fail to improve.   Right Knee Exam   Tenderness  The patient is experiencing tenderness in the medial joint line.  Range of Motion  The patient has normal right knee ROM.  Muscle Strength   The patient has normal right knee strength.  Tests  Varus: negative Valgus: negative  Other  Erythema: absent Swelling: mild   Left Knee Exam   Tenderness  The patient is experiencing tenderness in the medial joint line.  Range of Motion  The patient has normal left knee ROM.  Muscle Strength   The patient has normal left knee strength.  Tests  Varus: negative Valgus: negative  Other  Erythema: absent Swelling: mild      Patient is alert, oriented, no adenopathy, well-dressed, normal affect, normal respiratory effort.   Imaging: No results found. No images are attached to the encounter.  Labs: No results found for: HGBA1C, ESRSEDRATE, CRP, LABURIC, REPTSTATUS, GRAMSTAIN,  CULT, LABORGA  Orders:  No orders of the defined types were placed in this encounter.  No orders of the defined types were placed in this encounter.    Procedures: Large Joint Inj Date/Time: 04/07/2017 2:11 PM Performed by: Barnie Del R Authorized by: Barnie Del R   Consent Given by:  Patient Site marked: the procedure site was marked   Timeout: prior to procedure the correct patient, procedure, and site was verified   Indications:  Pain and diagnostic evaluation Location:  Knee Site:  R knee Needle Size:  22 G Needle Length:  1.5 inches Ultrasound Guidance: No   Fluoroscopic Guidance: No   Arthrogram: No   Medications:  5 mL lidocaine 1 %; 40 mg methylPREDNISolone acetate 40 MG/ML Aspiration Attempted: No   Patient tolerance:  Patient tolerated the procedure well with no immediate complications Large Joint Inj Date/Time: 04/07/2017 2:11 PM Performed by: Adonis Huguenin Authorized by: Barnie Del R   Consent Given by:  Patient Site marked: the procedure site was marked   Timeout: prior to procedure the correct patient, procedure, and site was verified   Indications:  Pain and diagnostic evaluation Location:  Knee Site:  L knee Needle Size:  22 G Needle Length:  1.5 inches Ultrasound Guidance: No   Fluoroscopic Guidance: No   Arthrogram: No   Medications:  5 mL lidocaine 1 %; 40 mg methylPREDNISolone  acetate 40 MG/ML Aspiration Attempted: No   Patient tolerance:  Patient tolerated the procedure well with no immediate complications    Clinical Data: No additional findings.  ROS:  All other systems negative, except as noted in the HPI. Review of Systems  Constitutional: Negative for chills and fever.  Musculoskeletal: Positive for arthralgias and joint swelling.    Objective: Vital Signs: There were no vitals taken for this visit.  Specialty Comments:  No specialty comments available.  PMFS History: Patient Active Problem List   Diagnosis Date Noted   . Primary osteoarthritis of left knee 08/16/2016  . Chronic pain of right knee 06/16/2016  . Chronic right-sided low back pain with right-sided sciatica 06/16/2016   No past medical history on file.  No family history on file.  No past surgical history on file. Social History   Occupational History  . Not on file.   Social History Main Topics  . Smoking status: Not on file  . Smokeless tobacco: Not on file  . Alcohol use Not on file  . Drug use: Unknown  . Sexual activity: Not on file

## 2017-04-11 ENCOUNTER — Ambulatory Visit (INDEPENDENT_AMBULATORY_CARE_PROVIDER_SITE_OTHER): Payer: Federal, State, Local not specified - PPO | Admitting: Orthopaedic Surgery

## 2017-12-15 ENCOUNTER — Encounter (INDEPENDENT_AMBULATORY_CARE_PROVIDER_SITE_OTHER): Payer: Self-pay | Admitting: Orthopedic Surgery

## 2017-12-15 ENCOUNTER — Ambulatory Visit (INDEPENDENT_AMBULATORY_CARE_PROVIDER_SITE_OTHER): Payer: Medicare Other | Admitting: Orthopedic Surgery

## 2017-12-15 DIAGNOSIS — M1712 Unilateral primary osteoarthritis, left knee: Secondary | ICD-10-CM

## 2017-12-15 DIAGNOSIS — M1711 Unilateral primary osteoarthritis, right knee: Secondary | ICD-10-CM

## 2017-12-15 DIAGNOSIS — M170 Bilateral primary osteoarthritis of knee: Secondary | ICD-10-CM | POA: Insufficient documentation

## 2017-12-15 NOTE — Progress Notes (Signed)
   Office Visit Note   Patient: Adrienne Rogers           Date of Birth: [birth year]           MRN: [id number] Visit Date: 12/15/2017              Requested by: Devra DoppHowell, Tamieka, MD [address] [place], [place] [phone] PCP: Devra DoppHowell, Tamieka, MD  Chief Complaint  Patient presents with  . Left Knee - Pain  . Right Knee - Pain      HPI: Patient is a 77 year old woman with osteoarthritis both knees who has had good interval relief with steroid injections.  Last injection was approximately 6 months ago.  Assessment & Plan: Visit Diagnoses:  1. Bilateral primary osteoarthritis of knee     Plan: Both knees were injected she tolerated this well plan to follow-up as needed.  Follow-Up Instructions: Return if symptoms worsen or fail to improve.   Ortho Exam  Patient is alert, oriented, no adenopathy, well-dressed, normal affect, normal respiratory effort. Examination patient has an antalgic gait.  She has varus alignment of both knees right worse than left.  She has crepitation with range of motion collaterals and cruciates are stable bilaterally she has tenderness to palpation of the medial and lateral joint line bilaterally.  Imaging: No results found. No images are attached to the encounter.  Labs: No results found for: HGBA1C, ESRSEDRATE, CRP, LABURIC, REPTSTATUS, GRAMSTAIN, CULT, LABORGA   No results found for: ALBUMIN, PREALBUMIN, LABURIC  There is no height or weight on file to calculate BMI.  Orders:  Orders Placed This Encounter  Procedures  . Large Joint Inj   No orders of the defined types were placed in this encounter.    Procedures: Large Joint Inj: bilateral knee on 12/15/2017 1:48 PM Indications: pain and diagnostic evaluation Details: 22 G 1.5 in needle, anteromedial approach  Arthrogram: No  Outcome: tolerated well, no immediate complications Procedure, treatment alternatives, risks and benefits explained, specific risks  discussed. Consent was given by the patient. Immediately prior to procedure a time out was called to verify the correct patient, procedure, equipment, support staff and site/side marked as required. Patient was prepped and draped in the usual sterile fashion.      Clinical Data: No additional findings.  ROS:  All other systems negative, except as noted in the HPI. Review of Systems  Objective: Vital Signs: There were no vitals taken for this visit.  Specialty Comments:  No specialty comments available.  PMFS History: Patient Active Problem List   Diagnosis Date Noted  . Bilateral primary osteoarthritis of knee 12/15/2017  . Primary osteoarthritis of left knee 08/16/2016  . Chronic pain of right knee 06/16/2016  . Chronic right-sided low back pain with right-sided sciatica 06/16/2016   History reviewed. No pertinent past medical history.  History reviewed. No pertinent family history.  History reviewed. No pertinent surgical history. Social History   Occupational History  . Not on file  Tobacco Use  . Smoking status: Never Smoker  . Smokeless tobacco: Never Used  Substance and Sexual Activity  . Alcohol use: Not on file  . Drug use: Not on file  . Sexual activity: Not on file

## 2018-01-21 ENCOUNTER — Emergency Department (HOSPITAL_COMMUNITY)
Admission: EM | Admit: 2018-01-21 | Discharge: 2018-01-21 | Disposition: A | Discharge status: Home / Self Care | Payer: Medicare Other | Attending: Emergency Medicine | Admitting: Emergency Medicine

## 2018-01-21 ENCOUNTER — Encounter (HOSPITAL_COMMUNITY): Payer: Self-pay

## 2018-01-21 ENCOUNTER — Emergency Department (HOSPITAL_COMMUNITY): Payer: Medicare Other

## 2018-01-21 DIAGNOSIS — Z7982 Long term (current) use of aspirin: Secondary | ICD-10-CM | POA: Insufficient documentation

## 2018-01-21 DIAGNOSIS — R0602 Shortness of breath: Secondary | ICD-10-CM | POA: Insufficient documentation

## 2018-01-21 DIAGNOSIS — J449 Chronic obstructive pulmonary disease, unspecified: Secondary | ICD-10-CM | POA: Insufficient documentation

## 2018-01-21 DIAGNOSIS — Z79899 Other long term (current) drug therapy: Secondary | ICD-10-CM | POA: Insufficient documentation

## 2018-01-21 DIAGNOSIS — I10 Essential (primary) hypertension: Secondary | ICD-10-CM | POA: Insufficient documentation

## 2018-01-21 HISTORY — DX: Chronic obstructive pulmonary disease, unspecified: J44.9

## 2018-01-21 HISTORY — DX: Hyperlipidemia, unspecified: E78.5

## 2018-01-21 HISTORY — DX: Depression, unspecified: F32.A

## 2018-01-21 HISTORY — DX: Essential (primary) hypertension: I10

## 2018-01-21 HISTORY — DX: Major depressive disorder, single episode, unspecified: F32.9

## 2018-01-21 HISTORY — DX: Unspecified asthma, uncomplicated: J45.909

## 2018-01-21 LAB — BASIC METABOLIC PANEL
Anion gap: 9 (ref 5–15)
BUN: 16 mg/dL (ref 8–23)
CALCIUM: 9.5 mg/dL (ref 8.9–10.3)
CO2: 24 mmol/L (ref 22–32)
Chloride: 108 mmol/L (ref 98–111)
Creatinine, Ser: 1.52 mg/dL — ABNORMAL HIGH (ref 0.44–1.00)
GFR calc Af Amer: 38 mL/min — ABNORMAL LOW (ref >60)
GFR, EST NON AFRICAN AMERICAN: 32 mL/min — AB (ref >60)
GLUCOSE: 97 mg/dL (ref 70–99)
POTASSIUM: 4.4 mmol/L (ref 3.5–5.1)
Sodium: 141 mmol/L (ref 135–145)

## 2018-01-21 LAB — CBC
HEMATOCRIT: 39.6 % (ref 36.0–46.0)
Hemoglobin: 12.6 g/dL (ref 12.0–15.0)
MCH: 29.2 pg (ref 26.0–34.0)
MCHC: 31.8 g/dL (ref 30.0–36.0)
MCV: 91.7 fL (ref 78.0–100.0)
Platelets: 278 10*3/uL (ref 150–400)
RBC: 4.32 MIL/uL (ref 3.87–5.11)
RDW: 14.2 % (ref 11.5–15.5)
WBC: 4.9 10*3/uL (ref 4.0–10.5)

## 2018-01-21 LAB — I-STAT TROPONIN, ED: Troponin i, poc: 0 ng/mL (ref 0.00–0.08)

## 2018-01-21 LAB — BRAIN NATRIURETIC PEPTIDE: B Natriuretic Peptide: 66.5 pg/mL (ref 0.0–100.0)

## 2018-01-21 MED ORDER — IOPAMIDOL (ISOVUE-370) INJECTION 76%
INTRAVENOUS | Status: AC
  Filled 2018-01-21: qty 100

## 2018-01-21 MED ORDER — IOPAMIDOL (ISOVUE-370) INJECTION 76%
80.0000 mL | Freq: Once | INTRAVENOUS | Status: AC | PRN
  Administered 2018-01-21: 100 mL via INTRAVENOUS

## 2018-01-21 NOTE — Discharge Instructions (Signed)
No blood clots seen with the CT scan today. No signs of heart attack either which is great!  Please take your Symbicort daily as prescribed by your primary doctor. Taking this daily will help control your asthma better.   Follow-up with your PCP soon to discuss the need for adjustments to your current asthma medications. Also, talk to your PCP about your urinary concerns and repeat blood work for your kidney function.  Take care of yourself! Thank you for allowing me to take care of you today!

## 2018-01-21 NOTE — ED Triage Notes (Signed)
Pt presents for evaluation of ongoing SOB x 6 weeks. Reports got worse on Sunday [facility] has appointment with cardiology on 7/31. Unable to sleep at night.

## 2018-01-21 NOTE — ED Provider Notes (Signed)
[facility] Provider Note  CSN: [id number] Arrival date & time: 01/21/18  1103    History   Chief Complaint Chief Complaint  Patient presents with  . Shortness of Breath    HPI Adrienne AgeeBernice L Mennenga is a 77 y.o. female with a medical history of asthma, COPD, HTN and HLD who presented to the ED for shortness of breath x6 weeks. Symptoms occur with exertion. She states it has progressed to the point where she becomes out of breath while doing light housework. Associated symptoms include  tightness in chest. She denies fever, cough/hemoptysis, orthopnea, leg swelling, palpitations, diaphoresis, pleuritic chest pain, lightheadedness, syncope. Denies upper respiratory symptoms. She has not had recent travel. Weight has been stable. Symptoms are exacerbated by housework and minimal activity. Symptoms are alleviated by rest.   Additional history obtained from medical chart. Patient [facility] on Sunday 01/15/18 for the same complaint. Per provider note, SOB possibly due to poor asthma control. No acute or emergent diagnoses were treated or mentioned.    Past Medical History:  Diagnosis Date  . Asthma   . COPD (chronic obstructive pulmonary disease) (HCC)   . Depression   . Hyperlipidemia   . Hypertension     Patient Active Problem List   Diagnosis Date Noted  . Bilateral primary osteoarthritis of knee 12/15/2017  . Primary osteoarthritis of left knee 08/16/2016  . Chronic pain of right knee 06/16/2016  . Chronic right-sided low back pain with right-sided sciatica 06/16/2016    History reviewed. No pertinent surgical history.   OB History   None      Home Medications    Prior to Admission medications   Medication Sig Start Date End Date Taking? Authorizing Provider  albuterol (PROVENTIL HFA;VENTOLIN HFA) 108 (90 Base) MCG/ACT inhaler Inhale into the lungs every 6 (six) hours as needed for wheezing or shortness of  breath.   Yes [provider]  amLODipine (NORVASC) 10 MG tablet Take 10 mg by mouth daily.   Yes [provider]  aspirin EC 81 MG tablet Take 81 mg by mouth daily.   Yes [provider]  budesonide-formoterol (SYMBICORT) 160-4.5 MCG/ACT inhaler Inhale 2 puffs into the lungs as needed.    Yes [provider]  buPROPion (WELLBUTRIN SR) 150 MG 12 hr tablet Take 150 mg by mouth daily. 05/07/13  Yes [provider]  docusate calcium (SURFAK) 240 MG capsule Take 240 mg by mouth daily.   Yes [provider]  docusate sodium (COLACE) 100 MG capsule Take 200 mg by mouth at bedtime. 04/07/10  Yes [provider]  FLUoxetine (PROZAC) 20 MG capsule Take 60 mg by mouth daily.    Yes [provider]  furosemide (LASIX) 20 MG tablet Take 20 mg by mouth daily.   Yes [provider]  ketotifen (ZADITOR) 0.025 % ophthalmic solution Place 1 drop into both eyes as needed.    Yes [provider]  losartan (COZAAR) 100 MG tablet Take 100 mg by mouth daily.   Yes [provider]  simvastatin (ZOCOR) 40 MG tablet Take 40 mg by mouth daily.   Yes [provider]  traZODone (DESYREL) 50 MG tablet Take 50 mg by mouth at bedtime.   Yes [provider]  Fluticasone-Salmeterol (ADVAIR) 250-50 MCG/DOSE AEPB Inhale 1 puff into the lungs 2 (two) times daily.    [provider]    Family History No family history on file.  Social History Social History   Tobacco Use  . Smoking status: Never Smoker  . Smokeless tobacco: Never Used  Substance Use Topics  . Alcohol use: Not on file  . Drug use: Not on file     Allergies   Codeine; Lisinopril; Sulfa antibiotics; Ezetimibe-simvastatin; Fosamax  [alendronate sodium]; and Other   Review of Systems Review of Systems  Constitutional: Positive for activity change. Negative for chills, fatigue and fever.  HENT: Negative for congestion, postnasal  drip, rhinorrhea, sinus pain and sore throat.   Eyes: Negative.   Respiratory: Positive for chest tightness and shortness of breath. Negative for cough and wheezing.   Cardiovascular: Negative for chest pain and leg swelling.  Gastrointestinal: Negative for abdominal distention.  Genitourinary: Negative for decreased urine volume, dysuria and hematuria.       Urinary incontinence.  Skin: Negative.   Neurological: Negative.      Physical Exam Updated Vital Signs BP (!) 153/86   Pulse (!) 123   Temp 98.2 F (36.8 C) (Oral)   Resp 12   SpO2 95%   Physical Exam  Constitutional: She appears well-developed and well-nourished. She does not appear ill. No distress.  HENT:  Head: Normocephalic and atraumatic.  Nose: Nose normal.  Mouth/Throat: Uvula is midline, oropharynx is clear and moist and mucous membranes are normal.  Eyes: Pupils are equal, round, and reactive to light. Conjunctivae, EOM and lids are normal.  Neck: Normal carotid pulses and no JVD present.  Cardiovascular: Normal rate, regular rhythm, normal heart sounds, intact distal pulses and normal pulses.  Pulses:      Dorsalis pedis pulses are 2+ on the right side, and 2+ on the left side.       Posterior tibial pulses are 2+ on the right side, and 2+ on the left side.  Pulmonary/Chest: Effort normal and breath sounds normal. She has no decreased breath sounds. She has no wheezes. She has no rhonchi. She has no rales. She exhibits no tenderness.  Abdominal: Soft. Normal appearance and bowel sounds are normal. There is no tenderness.  Skin: Skin is warm. She is not diaphoretic.  Nursing note and vitals reviewed.    ED Treatments / Results  Labs (all labs ordered are listed, but only abnormal results are displayed) Labs Reviewed  BASIC METABOLIC PANEL - Abnormal; Notable for the following components:      Result Value   Creatinine, Ser 1.52 (*)    GFR calc non Af Amer 32 (*)    GFR calc Af Amer 38 (*)    All other  components within normal limits  CBC  BRAIN NATRIURETIC PEPTIDE  I-STAT TROPONIN, ED    EKG None  Radiology Dg Chest 2 View  Result Date: 01/21/2018 CLINICAL DATA:  Shortness-of-breath 6 weeks. EXAM: CHEST - 2 VIEW COMPARISON:  02/01/2004 FINDINGS: Lungs are adequately inflated without consolidation or effusion. Cardiomediastinal silhouette is within normal. Degenerative changes spine with paravertebral osteophytes. IMPRESSION: No active cardiopulmonary disease. Electronically Signed   By: Elberta Fortis M.D.   On: 01/21/2018 13:18   Ct Angio Chest Pe W And/or Wo Contrast  Result Date: 01/21/2018 CLINICAL DATA:  Ongoing shortness-of-breath 6 weeks. Evaluate for pulmonary embolism. EXAM: CT ANGIOGRAPHY CHEST WITH CONTRAST TECHNIQUE: Multidetector CT imaging of the chest was performed using the standard protocol during bolus administration of intravenous contrast. Multiplanar CT image reconstructions and MIPs were obtained to evaluate the vascular anatomy. CONTRAST:  ISOVUE-370 IOPAMIDOL (ISOVUE-370) INJECTION 76% COMPARISON:  Chest x-ray 01/21/2018  FINDINGS: Cardiovascular: Mild cardiomegaly. Pulmonary arterial system is normal without evidence of emboli. Thoracic aorta is within normal. Subtle calcified plaque over the proximal left anterior descending coronary artery. Mediastinum/Nodes: No significant mediastinal or hilar adenopathy. Remaining mediastinal structures are unremarkable. Lungs/Pleura: Lungs are adequately inflated without focal airspace consolidation or effusion. Airways are within normal. Upper Abdomen: Previous cholecystectomy.  No acute findings. Musculoskeletal: Degenerative change of the spine. Review of the MIP images confirms the above findings. IMPRESSION: No evidence of pulmonary embolism and no acute cardiopulmonary disease. Mild cardiomegaly. Electronically Signed   By: Elberta Fortis M.D.   On: 01/21/2018 15:07    Procedures Procedures (including critical care  time)  Medications Ordered in ED Medications  iopamidol (ISOVUE-370) 76 % injection (has no administration in time range)  iopamidol (ISOVUE-370) 76 % injection 80 mL (100 mLs Intravenous Contrast Given 01/21/18 1450)     Initial Impression / Assessment and Plan / ED Course  Triage vital signs and the nursing notes have been reviewed.  Pertinent labs & imaging results that were available during care of the patient were reviewed and considered in medical decision making (see chart for details).  Patient presents in no acute distress and is well appearing. On exam, she is asymptomatic and does not appear in respiratory distress. Her oxygen saturations have been > 95% since being in the ED. Initial lab work, EKG and CXR is unremarkable, but there is concern for PE given that symptoms worsen with exertion and have been ongoing for 6 weeks.  Clinical Course as of Jan 22 1535  Sat Jan 21, 2018  1204 EKG showed NSR. No ST elevations/depressions or signs of acute ischemia or infarct. This is reassuring in combination with negative troponin which assists in evaluating and ruling out an acute cardiac process.   [GM]  1257 CTA chest ordered to evaluate for PE.Considered d-dimer but with elevated creatinine, d-dimer may be elevated from that.   [GM]  1258 Creatinine elevated at 1.52. Pt's creatinine in 05/2017 was 1.41 at her well-visit with the PCP. No metabolic abnormalities, EKG changes or s/s of renal failure that warrant additional evaluation today.   [GM]  1459 BNP normal   [GM]  1515 CTA chest also normal. No PE seen. No evidence of intraparenchymal or pleura abnormalities that could be contributing symptoms.  Symptoms likely due to patient's asthma. She may require adjustment in current medications for it. Patient later admitted to not being compliant with her Symbicort daily, but states that she still experiences SOB on the days she does use it. Patient has an Albuterol inhaler at home.    [GM]    Clinical Course User Index [GM] Boyd Buffalo, Sharyon Medicus, PA-C   Final Clinical Impressions(s) / ED Diagnoses  1. Shortness of Breath. Advised to follow-up with PCP to discuss asthma medication management. Patient states that she will go to the [place] on Monday 01/23/18 for follow-up. Education on her current asthma medications provided and encouraged to take as prescribed. Education on s/s that warrant return to the ED vs PCP provided. 2. Elevated Creatinine. Elevated from labs in 05/2017 from PCP. Advised to follow-up with her PCP closely for repeat blood work.  Dispo: Home. After thorough clinical evaluation, this patient is determined to be medically stable and can be safely discharged with the previously mentioned treatment and/or outpatient follow-up/referral(s). At this time, there are no other apparent medical conditions that require further screening, evaluation or treatment.   Final diagnoses:  Shortness of breath  ED Discharge Orders    None        Reva Bores 01/21/18 1536    Jacalyn Lefevre, MD 01/21/18 [phone]

## 2018-01-21 NOTE — ED Triage Notes (Signed)
PT ambulated in hall  O2% 99 on RA.

## 2018-01-21 NOTE — ED Notes (Signed)
Patient transported to CT 

## 2018-03-03 ENCOUNTER — Other Ambulatory Visit: Payer: Self-pay | Admitting: Orthopaedic Surgery

## 2018-03-08 NOTE — Progress Notes (Addendum)
PCP: [facility] in [place], Dr. Allena Katz  Cardiologist: pt denies  EKG: 01/21/18 in EPIC  Stress test: pt denies  ECHO: pt denies  Cardiac Cath: pt denies  Chest x-ray: 01/21/18 in Epic  Pt is on aspirin- has not received instructions from Dr. Jerl Santos on when to hold aspirin -advised to call his office to get instructions

## 2018-03-08 NOTE — Pre-Procedure Instructions (Signed)
Adrienne Rogers  03/08/2018      [id number] [place], [place] - [address] AT [facility] [address] [facility] [address] [place] [address] Phone: [phone] Fax: [phone]    Your procedure is scheduled on September 10  Report to [facility] Admitting at 1040 A.M.  Call this number if you have problems the morning of surgery:  [phone]   Remember:  Do not eat or drink after midnight.     Take these medicines the morning of surgery with A SIP OF WATER  acetaminophen (TYLENOL) albuterol (PROVENTIL HFA;VENTOLIN HFA)  amLODipine (NORVASC) budesonide-formoterol (SYMBICORT)  FLUoxetine (PROZAC)  omeprazole (PRILOSEC)  Eye drops if needed  Bring inhalers with you the morning of surgery  7 days prior to surgery STOP taking any Aspirin(unless otherwise instructed by your surgeon), Aleve, Naproxen, Ibuprofen, Motrin, Advil, Goody's, BC's, all herbal medications, fish oil, and all vitamins  Follow your surgeon's instructions on when to stop Asprin.  If no instructions were given by your surgeon then you will need to call the office to get those instructions.       Do not wear jewelry, make-up or nail polish.  Do not wear lotions, powders, or perfumes, or deodorant.  Do not shave 48 hours prior to surgery.    Do not bring valuables to the hospital.  [facility] is not responsible for any belongings or valuables.  Contacts, dentures or bridgework may not be worn into surgery.  Leave your suitcase in the car.  After surgery it may be brought to your room.  For patients admitted to the hospital, discharge time will be determined by your treatment team.  Patients discharged the day of surgery will not be allowed to drive home.    Special instructions:   [place]- Preparing For Surgery  Before surgery, you can play an important role. Because skin is not sterile, your skin needs to be as free of germs as possible. You can reduce  the number of germs on your skin by washing with CHG (chlorahexidine gluconate) Soap before surgery.  CHG is an antiseptic cleaner which kills germs and bonds with the skin to continue killing germs even after washing.    Oral Hygiene is also important to reduce your risk of infection.  Remember - BRUSH YOUR TEETH THE MORNING OF SURGERY WITH YOUR REGULAR TOOTHPASTE  Please do not use if you have an allergy to CHG or antibacterial soaps. If your skin becomes reddened/irritated stop using the CHG.  Do not shave (including legs and underarms) for at least 48 hours prior to first CHG shower. It is OK to shave your face.  Please follow these instructions carefully.   1. Shower the NIGHT BEFORE SURGERY and the MORNING OF SURGERY with CHG.   2. If you chose to wash your hair, wash your hair first as usual with your normal shampoo.  3. After you shampoo, rinse your hair and body thoroughly to remove the shampoo.  4. Use CHG as you would any other liquid soap. You can apply CHG directly to the skin and wash gently with a scrungie or a clean washcloth.   5. Apply the CHG Soap to your body ONLY FROM THE NECK DOWN.  Do not use on open wounds or open sores. Avoid contact with your eyes, ears, mouth and genitals (private parts). Wash Face and genitals (private parts)  with your normal soap.  6. Wash  thoroughly, paying special attention to the area where your surgery will be performed.  7. Thoroughly rinse your body with warm water from the neck down.  8. DO NOT shower/wash with your normal soap after using and rinsing off the CHG Soap.  9. Pat yourself dry with a CLEAN TOWEL.  10. Wear CLEAN PAJAMAS to bed the night before surgery, wear comfortable clothes the morning of surgery  11. Place CLEAN SHEETS on your bed the night of your first shower and DO NOT SLEEP WITH PETS.    Day of Surgery:  Do not apply any deodorants/lotions.  Please wear clean clothes to the hospital/surgery center.    Remember to brush your teeth WITH YOUR REGULAR TOOTHPASTE.    Please read over the following fact sheets that you were given.

## 2018-03-09 ENCOUNTER — Encounter (HOSPITAL_COMMUNITY)
Admission: RE | Admit: 2018-03-09 | Discharge: 2018-03-09 | Disposition: A | Discharge status: Home / Self Care | Payer: Medicare Other | Source: Ambulatory Visit | Attending: Orthopaedic Surgery | Admitting: Orthopaedic Surgery

## 2018-03-09 ENCOUNTER — Other Ambulatory Visit: Payer: Self-pay

## 2018-03-09 ENCOUNTER — Other Ambulatory Visit: Payer: Self-pay | Admitting: Orthopaedic Surgery

## 2018-03-09 ENCOUNTER — Encounter (HOSPITAL_COMMUNITY): Payer: Self-pay

## 2018-03-09 DIAGNOSIS — Z01818 Encounter for other preprocedural examination: Secondary | ICD-10-CM | POA: Insufficient documentation

## 2018-03-09 DIAGNOSIS — M1711 Unilateral primary osteoarthritis, right knee: Secondary | ICD-10-CM | POA: Insufficient documentation

## 2018-03-09 HISTORY — DX: Post-traumatic stress disorder, unspecified: F43.10

## 2018-03-09 LAB — URINALYSIS, ROUTINE W REFLEX MICROSCOPIC
BILIRUBIN URINE: NEGATIVE
GLUCOSE, UA: NEGATIVE mg/dL
Hgb urine dipstick: NEGATIVE
Ketones, ur: 5 mg/dL — AB
Leukocytes, UA: NEGATIVE
Nitrite: NEGATIVE
Protein, ur: NEGATIVE mg/dL
SPECIFIC GRAVITY, URINE: 1.023 (ref 1.005–1.030)
pH: 6 (ref 5.0–8.0)

## 2018-03-09 LAB — CBC WITH DIFFERENTIAL/PLATELET
ABS IMMATURE GRANULOCYTES: 0 10*3/uL (ref 0.0–0.1)
Basophils Absolute: 0 10*3/uL (ref 0.0–0.1)
Basophils Relative: 1 %
Eosinophils Absolute: 0.1 10*3/uL (ref 0.0–0.7)
Eosinophils Relative: 3 %
HEMATOCRIT: 40.5 % (ref 36.0–46.0)
Hemoglobin: 12.7 g/dL (ref 12.0–15.0)
IMMATURE GRANULOCYTES: 0 %
LYMPHS ABS: 2.1 10*3/uL (ref 0.7–4.0)
Lymphocytes Relative: 43 %
MCH: 28.9 pg (ref 26.0–34.0)
MCHC: 31.4 g/dL (ref 30.0–36.0)
MCV: 92.3 fL (ref 78.0–100.0)
MONOS PCT: 12 %
Monocytes Absolute: 0.6 10*3/uL (ref 0.1–1.0)
NEUTROS ABS: 2 10*3/uL (ref 1.7–7.7)
NEUTROS PCT: 41 %
PLATELETS: 285 10*3/uL (ref 150–400)
RBC: 4.39 MIL/uL (ref 3.87–5.11)
RDW: 14.5 % (ref 11.5–15.5)
WBC: 4.9 10*3/uL (ref 4.0–10.5)

## 2018-03-09 LAB — TYPE AND SCREEN
ABO/RH(D): A POS
ANTIBODY SCREEN: NEGATIVE

## 2018-03-09 LAB — BASIC METABOLIC PANEL
ANION GAP: 10 (ref 5–15)
BUN: 18 mg/dL (ref 8–23)
CHLORIDE: 108 mmol/L (ref 98–111)
CO2: 24 mmol/L (ref 22–32)
Calcium: 9.9 mg/dL (ref 8.9–10.3)
Creatinine, Ser: 1.31 mg/dL — ABNORMAL HIGH (ref 0.44–1.00)
GFR calc non Af Amer: 39 mL/min — ABNORMAL LOW (ref >60)
GFR, EST AFRICAN AMERICAN: 45 mL/min — AB (ref >60)
Glucose, Bld: 104 mg/dL — ABNORMAL HIGH (ref 70–99)
POTASSIUM: 3.9 mmol/L (ref 3.5–5.1)
Sodium: 142 mmol/L (ref 135–145)

## 2018-03-09 LAB — APTT: aPTT: 29 seconds (ref 24–36)

## 2018-03-09 LAB — PROTIME-INR
INR: 1.03
Prothrombin Time: 13.4 seconds (ref 11.4–15.2)

## 2018-03-09 LAB — SURGICAL PCR SCREEN
MRSA, PCR: NEGATIVE
Staphylococcus aureus: NEGATIVE

## 2018-03-09 LAB — ABO/RH: ABO/RH(D): A POS

## 2018-03-09 NOTE — Care Plan (Signed)
Spoke with patient. She states that her sister can no longer come to take help her initially after surgery. She will need STSNF. I have spoke with admissions at [facility] and they are expecting her. She is in agreeable with this plan. She is planning to have someone transport her.  Her DME will be determined prior to discharge from the SNF.   Please contact Shauna Hugh, RNCM [phone] with questions.    Thanks

## 2018-03-09 NOTE — H&P (Signed)
TOTAL KNEE ADMISSION H&P  Patient is being admitted for right total knee arthroplasty.  Subjective:  Chief Complaint:right knee pain.  HPI: Adrienne Rogers, 77 y.o. female, has a history of pain and functional disability in the right knee due to arthritis and has failed non-surgical conservative treatments for greater than 12 weeks to includeNSAID's and/or analgesics, corticosteriod injections, viscosupplementation injections, flexibility and strengthening excercises, use of assistive devices, weight reduction as appropriate and activity modification.  Onset of symptoms was gradual, starting 5 years ago with gradually worsening course since that time. The patient noted prior procedures on the knee to include  arthroscopy on the right knee(s).  Patient currently rates pain in the right knee(s) at 10 out of 10 with activity. Patient has night pain, worsening of pain with activity and weight bearing, pain that interferes with activities of daily living, crepitus and joint swelling.  Patient has evidence of subchondral cysts, subchondral sclerosis, periarticular osteophytes and joint space narrowing by imaging studies. There is no active infection.  Patient Active Problem List   Diagnosis Date Noted  . Bilateral primary osteoarthritis of knee 12/15/2017  . Primary osteoarthritis of left knee 08/16/2016  . Chronic pain of right knee 06/16/2016  . Chronic right-sided low back pain with right-sided sciatica 06/16/2016   Past Medical History:  Diagnosis Date  . Asthma   . COPD (chronic obstructive pulmonary disease) (HCC)   . Depression   . Hyperlipidemia   . Hypertension   . PTSD (post-traumatic stress disorder)     Past Surgical History:  Procedure Laterality Date  . ABDOMINAL HYSTERECTOMY     1981  . BACK SURGERY     ACDF 2005  . CHOLECYSTECTOMY    . right knee arthroscopy      No current facility-administered medications for this encounter.    Current Outpatient Medications   Medication Sig Dispense Refill Last Dose  . acetaminophen (TYLENOL) 500 MG tablet Take 1,000 mg by mouth 2 (two) times daily as needed for moderate pain.     Marland Kitchen albuterol (PROVENTIL HFA;VENTOLIN HFA) 108 (90 Base) MCG/ACT inhaler Inhale 2 puffs into the lungs every 6 (six) hours as needed for wheezing or shortness of breath.      Marland Kitchen amLODipine (NORVASC) 10 MG tablet Take 10 mg by mouth daily.     Marland Kitchen aspirin EC 81 MG tablet Take 81 mg by mouth daily.     . budesonide-formoterol (SYMBICORT) 160-4.5 MCG/ACT inhaler Inhale 2 puffs into the lungs 2 (two) times daily as needed (shortness of breath).      Marland Kitchen buPROPion (WELLBUTRIN SR) 150 MG 12 hr tablet Take 150 mg by mouth daily.     . diclofenac sodium (VOLTAREN) 1 % GEL Apply 2-4 g topically 4 (four) times daily as needed (knee pain).   1   . docusate calcium (SURFAK) 240 MG capsule Take 240 mg by mouth daily as needed for mild constipation.      Marland Kitchen FLUoxetine (PROZAC) 20 MG capsule Take 60 mg by mouth daily.      . furosemide (LASIX) 20 MG tablet Take 20 mg by mouth daily.     Marland Kitchen ketotifen (ZADITOR) 0.025 % ophthalmic solution Place 1 drop into both eyes as needed (allergies).      . losartan (COZAAR) 100 MG tablet Take 100 mg by mouth daily.     . Multiple Vitamin (MULTIVITAMIN WITH MINERALS) TABS tablet Take 1 tablet by mouth daily.     Marland Kitchen omeprazole (PRILOSEC) 40 MG capsule  Take 40 mg by mouth daily as needed (acid reflux).     . simvastatin (ZOCOR) 40 MG tablet Take 40 mg by mouth at bedtime.      . sodium chloride (OCEAN) 0.65 % SOLN nasal spray Place 1 spray into both nostrils as needed for congestion.     . traZODone (DESYREL) 50 MG tablet Take 50 mg by mouth at bedtime.      Allergies  Allergen Reactions  . Lisinopril Tinitus  . Sulfa Antibiotics Itching and Rash  . Ezetimibe-Simvastatin Other (See Comments)    Muscle Pain  . Fosamax  [Alendronate Sodium] Other (See Comments)    Heartburn  . Eggs Or Egg-Derived Products Nausea And Vomiting     Social History   Tobacco Use  . Smoking status: Never Smoker  . Smokeless tobacco: Never Used  Substance Use Topics  . Alcohol use: Not Currently    No family history on file.   Review of Systems  Musculoskeletal: Positive for joint pain.       Right knee  All other systems reviewed and are negative.   Objective:  Physical Exam  Constitutional: She is oriented to person, place, and time. She appears well-developed and well-nourished.  HENT:  Head: Normocephalic and atraumatic.  Eyes: Pupils are equal, round, and reactive to light.  Neck: Normal range of motion.  Cardiovascular: Normal rate and regular rhythm.  Respiratory: Effort normal.  GI: Soft.  Musculoskeletal:  Right knee motion is 0-115.  She has trace effusion and a mild varus deformity.  There is medial joint line pain and some crepitation.  Hip motion is full and straight leg raise is negative.   Neurological: She is alert and oriented to person, place, and time.  Skin: Skin is warm and dry.  Psychiatric: She has a normal mood and affect. Her behavior is normal. Judgment and thought content normal.    Vital signs in last 24 hours: Temp:  [98 F (36.7 C)] 98 F (36.7 C) (09/05 0950) Pulse Rate:  [68] 68 (09/05 0950) Resp:  [20] 20 (09/05 0950) BP: (134)/(67) 134/67 (09/05 0950) SpO2:  [98 %] 98 % (09/05 0950) Weight:  [83.4 kg] 83.4 kg (09/05 0950)  Labs:   Estimated body mass index is 31.57 kg/m as calculated from the following:   Height as of 03/09/18: 5\' 4"  (1.626 m).   Weight as of 03/09/18: 83.4 kg.   Imaging Review Plain radiographs demonstrate severe degenerative joint disease of the right knee(s). The overall alignment isneutral. The bone quality appears to be good for age and reported activity level.   Preoperative templating of the joint replacement has been completed, documented, and submitted to the Operating Room personnel in order to optimize intra-operative equipment  management.    Patient's anticipated LOS is less than 2 midnights, meeting these requirements: - Younger than 39 - Lives within 1 hour of care - Has a competent adult at home to recover with post-op recover - NO history of  - Chronic pain requiring opiods  - Diabetes  - Coronary Artery Disease  - Heart failure  - Heart attack  - Stroke  - DVT/VTE  - Cardiac arrhythmia  - Respiratory Failure/COPD  - Renal failure  - Anemia  - Advanced Liver disease        Assessment/Plan:  End stage primary arthritis, right knee   The patient history, physical examination, clinical judgment of the provider and imaging studies are consistent with end stage degenerative joint disease of the  right knee(s) and total knee arthroplasty is deemed medically necessary. The treatment options including medical management, injection therapy arthroscopy and arthroplasty were discussed at length. The risks and benefits of total knee arthroplasty were presented and reviewed. The risks due to aseptic loosening, infection, stiffness, patella tracking problems, thromboembolic complications and other imponderables were discussed. The patient acknowledged the explanation, agreed to proceed with the plan and consent was signed. Patient is being admitted for inpatient treatment for surgery, pain control, PT, OT, prophylactic antibiotics, VTE prophylaxis, progressive ambulation and ADL's and discharge planning. The patient is planning to be discharged home with home health services

## 2018-03-13 MED ORDER — TRANEXAMIC ACID 1000 MG/10ML IV SOLN
2000.0000 mg | INTRAVENOUS | Status: AC
  Administered 2018-03-14: 2000 mg via TOPICAL
  Filled 2018-03-13: qty 20

## 2018-03-13 MED ORDER — BUPIVACAINE LIPOSOME 1.3 % IJ SUSP
20.0000 mL | Freq: Once | INTRAMUSCULAR | Status: AC
  Administered 2018-03-14: 20 mL
  Filled 2018-03-13: qty 20

## 2018-03-13 MED ORDER — TRANEXAMIC ACID 1000 MG/10ML IV SOLN
1000.0000 mg | INTRAVENOUS | Status: AC
  Administered 2018-03-14: 1000 mg via INTRAVENOUS
  Filled 2018-03-13: qty 1000

## 2018-03-14 ENCOUNTER — Inpatient Hospital Stay (HOSPITAL_COMMUNITY)
Admission: RE | Admit: 2018-03-14 | Discharge: 2018-03-16 | DRG: 470 | Disposition: A | Discharge status: Skilled Nursing Facility | Payer: Medicare Other | Attending: Orthopaedic Surgery | Admitting: Orthopaedic Surgery

## 2018-03-14 ENCOUNTER — Encounter (HOSPITAL_COMMUNITY): Payer: Self-pay | Admitting: Certified Registered Nurse Anesthetist

## 2018-03-14 ENCOUNTER — Encounter (HOSPITAL_COMMUNITY)
Admission: RE | Disposition: A | Discharge status: Skilled Nursing Facility | Payer: Self-pay | Source: Home / Self Care | Attending: Orthopaedic Surgery

## 2018-03-14 ENCOUNTER — Inpatient Hospital Stay (HOSPITAL_COMMUNITY): Payer: Medicare Other | Admitting: Certified Registered Nurse Anesthetist

## 2018-03-14 ENCOUNTER — Inpatient Hospital Stay (HOSPITAL_COMMUNITY): Payer: Medicare Other | Admitting: Physician Assistant

## 2018-03-14 DIAGNOSIS — M1711 Unilateral primary osteoarthritis, right knee: Principal | ICD-10-CM | POA: Diagnosis present

## 2018-03-14 DIAGNOSIS — J449 Chronic obstructive pulmonary disease, unspecified: Secondary | ICD-10-CM | POA: Diagnosis present

## 2018-03-14 DIAGNOSIS — Z7951 Long term (current) use of inhaled steroids: Secondary | ICD-10-CM

## 2018-03-14 DIAGNOSIS — Z79899 Other long term (current) drug therapy: Secondary | ICD-10-CM

## 2018-03-14 DIAGNOSIS — I10 Essential (primary) hypertension: Secondary | ICD-10-CM | POA: Diagnosis present

## 2018-03-14 DIAGNOSIS — Z6831 Body mass index (BMI) 31.0-31.9, adult: Secondary | ICD-10-CM

## 2018-03-14 DIAGNOSIS — E785 Hyperlipidemia, unspecified: Secondary | ICD-10-CM | POA: Diagnosis present

## 2018-03-14 DIAGNOSIS — Z7982 Long term (current) use of aspirin: Secondary | ICD-10-CM

## 2018-03-14 DIAGNOSIS — E669 Obesity, unspecified: Secondary | ICD-10-CM | POA: Diagnosis present

## 2018-03-14 DIAGNOSIS — F4310 Post-traumatic stress disorder, unspecified: Secondary | ICD-10-CM | POA: Diagnosis present

## 2018-03-14 DIAGNOSIS — M5441 Lumbago with sciatica, right side: Secondary | ICD-10-CM | POA: Diagnosis present

## 2018-03-14 HISTORY — PX: TOTAL KNEE ARTHROPLASTY: SHX125

## 2018-03-14 SURGERY — ARTHROPLASTY, KNEE, TOTAL
Anesthesia: Spinal | Site: Knee | Laterality: Right

## 2018-03-14 MED ORDER — SODIUM CHLORIDE 0.9 % IR SOLN
Status: DC | PRN
  Administered 2018-03-14: 3000 mL

## 2018-03-14 MED ORDER — CHLORHEXIDINE GLUCONATE 4 % EX LIQD: 60.0000 mL | Freq: Once | CUTANEOUS | Status: DC

## 2018-03-14 MED ORDER — MIDAZOLAM HCL 2 MG/2ML IJ SOLN
INTRAMUSCULAR | Status: AC
  Filled 2018-03-14: qty 2

## 2018-03-14 MED ORDER — HYDROCODONE-ACETAMINOPHEN 7.5-325 MG PO TABS
1.0000 | ORAL_TABLET | ORAL | Status: DC | PRN
  Administered 2018-03-15: 2 via ORAL
  Filled 2018-03-14 (×2): qty 2
  Filled 2018-03-14: qty 1

## 2018-03-14 MED ORDER — LIDOCAINE 2% (20 MG/ML) 5 ML SYRINGE
INTRAMUSCULAR | Status: AC
  Filled 2018-03-14: qty 5

## 2018-03-14 MED ORDER — DOCUSATE SODIUM 100 MG PO CAPS
100.0000 mg | ORAL_CAPSULE | Freq: Two times a day (BID) | ORAL | Status: DC
  Administered 2018-03-14 – 2018-03-16 (×4): 100 mg via ORAL
  Filled 2018-03-14 (×4): qty 1

## 2018-03-14 MED ORDER — ALBUTEROL SULFATE (2.5 MG/3ML) 0.083% IN NEBU: 3.0000 mL | INHALATION_SOLUTION | Freq: Four times a day (QID) | RESPIRATORY_TRACT | Status: DC | PRN

## 2018-03-14 MED ORDER — MIDAZOLAM HCL 2 MG/2ML IJ SOLN
INTRAMUSCULAR | Status: AC
  Administered 2018-03-14: 1 mg via INTRAVENOUS
  Filled 2018-03-14: qty 2

## 2018-03-14 MED ORDER — BUPIVACAINE-EPINEPHRINE (PF) 0.5% -1:200000 IJ SOLN
INTRAMUSCULAR | Status: DC | PRN
  Administered 2018-03-14: 30 mL

## 2018-03-14 MED ORDER — METOCLOPRAMIDE HCL 5 MG PO TABS: 5.0000 mg | ORAL_TABLET | Freq: Three times a day (TID) | ORAL | Status: DC | PRN

## 2018-03-14 MED ORDER — LOSARTAN POTASSIUM 50 MG PO TABS
100.0000 mg | ORAL_TABLET | Freq: Every day | ORAL | Status: DC
  Administered 2018-03-15 – 2018-03-16 (×2): 100 mg via ORAL
  Filled 2018-03-14 (×2): qty 2

## 2018-03-14 MED ORDER — FENTANYL CITRATE (PF) 100 MCG/2ML IJ SOLN
25.0000 ug | INTRAMUSCULAR | Status: DC | PRN
  Administered 2018-03-14: 25 ug via INTRAVENOUS

## 2018-03-14 MED ORDER — ONDANSETRON HCL 4 MG/2ML IJ SOLN
INTRAMUSCULAR | Status: AC
  Filled 2018-03-14: qty 2

## 2018-03-14 MED ORDER — CEFAZOLIN SODIUM-DEXTROSE 2-4 GM/100ML-% IV SOLN
2.0000 g | INTRAVENOUS | Status: AC
  Administered 2018-03-14: 2 g via INTRAVENOUS

## 2018-03-14 MED ORDER — PHENOL 1.4 % MT LIQD: 1.0000 | OROMUCOSAL | Status: DC | PRN

## 2018-03-14 MED ORDER — FENTANYL CITRATE (PF) 100 MCG/2ML IJ SOLN
INTRAMUSCULAR | Status: AC
  Administered 2018-03-14: 50 ug via INTRAVENOUS
  Filled 2018-03-14: qty 2

## 2018-03-14 MED ORDER — SODIUM CHLORIDE 0.9% FLUSH
INTRAVENOUS | Status: DC | PRN
  Administered 2018-03-14: 30 mL

## 2018-03-14 MED ORDER — DIPHENHYDRAMINE HCL 12.5 MG/5ML PO ELIX: 12.5000 mg | ORAL_SOLUTION | ORAL | Status: DC | PRN

## 2018-03-14 MED ORDER — DEXAMETHASONE SODIUM PHOSPHATE 10 MG/ML IJ SOLN
INTRAMUSCULAR | Status: AC
  Filled 2018-03-14: qty 1

## 2018-03-14 MED ORDER — FENTANYL CITRATE (PF) 100 MCG/2ML IJ SOLN
INTRAMUSCULAR | Status: AC
  Filled 2018-03-14: qty 2

## 2018-03-14 MED ORDER — DEXAMETHASONE SODIUM PHOSPHATE 10 MG/ML IJ SOLN
INTRAMUSCULAR | Status: DC | PRN
  Administered 2018-03-14: 10 mg via INTRAVENOUS

## 2018-03-14 MED ORDER — CEFAZOLIN SODIUM-DEXTROSE 2-4 GM/100ML-% IV SOLN
2.0000 g | Freq: Four times a day (QID) | INTRAVENOUS | Status: AC
  Administered 2018-03-14 – 2018-03-15 (×2): 2 g via INTRAVENOUS
  Filled 2018-03-14 (×2): qty 100

## 2018-03-14 MED ORDER — ONDANSETRON HCL 4 MG/2ML IJ SOLN
4.0000 mg | Freq: Four times a day (QID) | INTRAMUSCULAR | Status: DC | PRN
  Administered 2018-03-16: 4 mg via INTRAVENOUS
  Filled 2018-03-14: qty 2

## 2018-03-14 MED ORDER — ALUM & MAG HYDROXIDE-SIMETH 200-200-20 MG/5ML PO SUSP: 30.0000 mL | ORAL | Status: DC | PRN

## 2018-03-14 MED ORDER — BISACODYL 5 MG PO TBEC: 5.0000 mg | DELAYED_RELEASE_TABLET | Freq: Every day | ORAL | Status: DC | PRN

## 2018-03-14 MED ORDER — BUPROPION HCL ER (SR) 150 MG PO TB12
150.0000 mg | ORAL_TABLET | Freq: Every day | ORAL | Status: DC
  Administered 2018-03-14 – 2018-03-15 (×2): 150 mg via ORAL
  Filled 2018-03-14 (×2): qty 1

## 2018-03-14 MED ORDER — MORPHINE SULFATE (PF) 2 MG/ML IV SOLN
0.5000 mg | INTRAVENOUS | Status: DC | PRN
  Administered 2018-03-15: 0.5 mg via INTRAVENOUS
  Filled 2018-03-14 (×2): qty 1

## 2018-03-14 MED ORDER — 0.9 % SODIUM CHLORIDE (POUR BTL) OPTIME
TOPICAL | Status: DC | PRN
  Administered 2018-03-14: 1000 mL

## 2018-03-14 MED ORDER — PROPOFOL 500 MG/50ML IV EMUL
INTRAVENOUS | Status: DC | PRN
  Administered 2018-03-14: 100 ug/kg/min via INTRAVENOUS

## 2018-03-14 MED ORDER — AMLODIPINE BESYLATE 10 MG PO TABS
10.0000 mg | ORAL_TABLET | Freq: Every day | ORAL | Status: DC
  Administered 2018-03-15 – 2018-03-16 (×2): 10 mg via ORAL
  Filled 2018-03-14 (×2): qty 1

## 2018-03-14 MED ORDER — FLUTICASONE FUROATE-VILANTEROL 200-25 MCG/INH IN AEPB
1.0000 | INHALATION_SPRAY | Freq: Every day | RESPIRATORY_TRACT | Status: DC
  Administered 2018-03-15 – 2018-03-16 (×2): 1 via RESPIRATORY_TRACT
  Filled 2018-03-14: qty 28

## 2018-03-14 MED ORDER — HYDROCODONE-ACETAMINOPHEN 5-325 MG PO TABS
1.0000 | ORAL_TABLET | ORAL | Status: DC | PRN
  Administered 2018-03-14 – 2018-03-15 (×3): 2 via ORAL
  Administered 2018-03-16 (×2): 1 via ORAL
  Filled 2018-03-14: qty 1
  Filled 2018-03-14 (×2): qty 2
  Filled 2018-03-14: qty 1
  Filled 2018-03-14: qty 2

## 2018-03-14 MED ORDER — SIMVASTATIN 40 MG PO TABS
40.0000 mg | ORAL_TABLET | Freq: Every day | ORAL | Status: DC
  Administered 2018-03-14: 40 mg via ORAL
  Filled 2018-03-14: qty 1

## 2018-03-14 MED ORDER — ONDANSETRON HCL 4 MG/2ML IJ SOLN
INTRAMUSCULAR | Status: DC | PRN
  Administered 2018-03-14: 4 mg via INTRAVENOUS

## 2018-03-14 MED ORDER — METOCLOPRAMIDE HCL 5 MG/ML IJ SOLN: 5.0000 mg | Freq: Three times a day (TID) | INTRAMUSCULAR | Status: DC | PRN

## 2018-03-14 MED ORDER — ROPIVACAINE HCL 5 MG/ML IJ SOLN
INTRAMUSCULAR | Status: DC | PRN
  Administered 2018-03-14: 30 mL via PERINEURAL

## 2018-03-14 MED ORDER — ASPIRIN EC 325 MG PO TBEC
325.0000 mg | DELAYED_RELEASE_TABLET | Freq: Two times a day (BID) | ORAL | Status: DC
  Administered 2018-03-15 – 2018-03-16 (×4): 325 mg via ORAL
  Filled 2018-03-14 (×4): qty 1

## 2018-03-14 MED ORDER — ACETAMINOPHEN 325 MG PO TABS: 325.0000 mg | ORAL_TABLET | Freq: Four times a day (QID) | ORAL | Status: DC | PRN

## 2018-03-14 MED ORDER — FLUOXETINE HCL 20 MG PO CAPS
60.0000 mg | ORAL_CAPSULE | Freq: Every day | ORAL | Status: DC
  Administered 2018-03-15 – 2018-03-16 (×2): 60 mg via ORAL
  Filled 2018-03-14 (×2): qty 3

## 2018-03-14 MED ORDER — ONDANSETRON HCL 4 MG PO TABS: 4.0000 mg | ORAL_TABLET | Freq: Four times a day (QID) | ORAL | Status: DC | PRN

## 2018-03-14 MED ORDER — PANTOPRAZOLE SODIUM 40 MG PO TBEC
80.0000 mg | DELAYED_RELEASE_TABLET | Freq: Every day | ORAL | Status: DC
  Administered 2018-03-14 – 2018-03-16 (×3): 80 mg via ORAL
  Filled 2018-03-14 (×3): qty 2

## 2018-03-14 MED ORDER — LIDOCAINE 2% (20 MG/ML) 5 ML SYRINGE
INTRAMUSCULAR | Status: DC | PRN
  Administered 2018-03-14: 50 mg via INTRAVENOUS

## 2018-03-14 MED ORDER — METHOCARBAMOL 1000 MG/10ML IJ SOLN
500.0000 mg | Freq: Four times a day (QID) | INTRAVENOUS | Status: DC | PRN
  Filled 2018-03-14: qty 5

## 2018-03-14 MED ORDER — FENTANYL CITRATE (PF) 100 MCG/2ML IJ SOLN
50.0000 ug | Freq: Once | INTRAMUSCULAR | Status: AC
  Administered 2018-03-14: 50 ug via INTRAVENOUS

## 2018-03-14 MED ORDER — KETOROLAC TROMETHAMINE 15 MG/ML IJ SOLN
7.5000 mg | Freq: Four times a day (QID) | INTRAMUSCULAR | Status: AC
  Administered 2018-03-14 – 2018-03-15 (×4): 7.5 mg via INTRAVENOUS
  Filled 2018-03-14 (×4): qty 1

## 2018-03-14 MED ORDER — KETOTIFEN FUMARATE 0.025 % OP SOLN
1.0000 [drp] | OPHTHALMIC | Status: DC | PRN
  Filled 2018-03-14: qty 5

## 2018-03-14 MED ORDER — LACTATED RINGERS IV SOLN
INTRAVENOUS | Status: DC
  Administered 2018-03-14 (×2): via INTRAVENOUS

## 2018-03-14 MED ORDER — MIDAZOLAM HCL 2 MG/2ML IJ SOLN
1.0000 mg | Freq: Once | INTRAMUSCULAR | Status: AC
  Administered 2018-03-14: 1 mg via INTRAVENOUS

## 2018-03-14 MED ORDER — FUROSEMIDE 20 MG PO TABS
20.0000 mg | ORAL_TABLET | Freq: Every day | ORAL | Status: DC
  Administered 2018-03-15 – 2018-03-16 (×2): 20 mg via ORAL
  Filled 2018-03-14 (×2): qty 1

## 2018-03-14 MED ORDER — ACETAMINOPHEN 500 MG PO TABS
500.0000 mg | ORAL_TABLET | Freq: Four times a day (QID) | ORAL | Status: AC
  Administered 2018-03-14 – 2018-03-15 (×4): 500 mg via ORAL
  Filled 2018-03-14 (×4): qty 1

## 2018-03-14 MED ORDER — PHENYLEPHRINE 40 MCG/ML (10ML) SYRINGE FOR IV PUSH (FOR BLOOD PRESSURE SUPPORT)
PREFILLED_SYRINGE | INTRAVENOUS | Status: DC | PRN
  Administered 2018-03-14 (×2): 80 ug via INTRAVENOUS
  Administered 2018-03-14: 120 ug via INTRAVENOUS

## 2018-03-14 MED ORDER — TRAZODONE HCL 50 MG PO TABS
50.0000 mg | ORAL_TABLET | Freq: Every day | ORAL | Status: DC
  Administered 2018-03-14 – 2018-03-15 (×2): 50 mg via ORAL
  Filled 2018-03-14 (×2): qty 1

## 2018-03-14 MED ORDER — CEFAZOLIN SODIUM-DEXTROSE 2-4 GM/100ML-% IV SOLN
INTRAVENOUS | Status: AC
  Filled 2018-03-14: qty 100

## 2018-03-14 MED ORDER — MIDAZOLAM HCL 2 MG/2ML IJ SOLN
INTRAMUSCULAR | Status: DC | PRN
  Administered 2018-03-14 (×2): 0.5 mg via INTRAVENOUS

## 2018-03-14 MED ORDER — DOCUSATE SODIUM 100 MG PO CAPS: 100.0000 mg | ORAL_CAPSULE | Freq: Every day | ORAL | Status: DC

## 2018-03-14 MED ORDER — LACTATED RINGERS IV SOLN
INTRAVENOUS | Status: DC
  Administered 2018-03-14 – 2018-03-15 (×3): via INTRAVENOUS

## 2018-03-14 MED ORDER — MENTHOL 3 MG MT LOZG: 1.0000 | LOZENGE | OROMUCOSAL | Status: DC | PRN

## 2018-03-14 MED ORDER — TRANEXAMIC ACID 1000 MG/10ML IV SOLN
1000.0000 mg | Freq: Once | INTRAVENOUS | Status: DC
  Filled 2018-03-14: qty 10

## 2018-03-14 MED ORDER — METHOCARBAMOL 500 MG PO TABS
500.0000 mg | ORAL_TABLET | Freq: Four times a day (QID) | ORAL | Status: DC | PRN
  Administered 2018-03-15 – 2018-03-16 (×2): 500 mg via ORAL
  Filled 2018-03-14 (×3): qty 1

## 2018-03-14 SURGICAL SUPPLY — 62 items
BAG DECANTER FOR FLEXI CONT (MISCELLANEOUS) IMPLANT
BANDAGE ELASTIC 6 VELCRO ST LF (GAUZE/BANDAGES/DRESSINGS) ×2 IMPLANT
BANDAGE ESMARK 6X9 LF (GAUZE/BANDAGES/DRESSINGS) ×1 IMPLANT
BLADE SAGITTAL 25.0X1.19X90 (BLADE) ×2 IMPLANT
BLADE SAGITTAL 25.0X1.19X90MM (BLADE) ×1
BLADE SAW SGTL 13.0X1.19X90.0M (BLADE) IMPLANT
BNDG CMPR 9X6 STRL LF SNTH (GAUZE/BANDAGES/DRESSINGS) ×1
BNDG CMPR MED 10X6 ELC LF (GAUZE/BANDAGES/DRESSINGS) ×1
BNDG ELASTIC 6X10 VLCR STRL LF (GAUZE/BANDAGES/DRESSINGS) ×3 IMPLANT
BNDG ESMARK 6X9 LF (GAUZE/BANDAGES/DRESSINGS) ×3
BOWL SMART MIX CTS (DISPOSABLE) ×3 IMPLANT
CEMENT HV SMART SET (Cement) ×6 IMPLANT
CEMENT TIBIA MBT (Knees) IMPLANT
CLOSURE WOUND 1/2 X4 (GAUZE/BANDAGES/DRESSINGS) ×1
COMP FEM CEM STD RT LCS (Orthopedic Implant) ×3 IMPLANT
COMPONENT FEM CEM STD RT LCS (Orthopedic Implant) IMPLANT
COVER SURGICAL LIGHT HANDLE (MISCELLANEOUS) ×3 IMPLANT
CUFF TOURNIQUET SINGLE 34IN LL (TOURNIQUET CUFF) ×3 IMPLANT
CUFF TOURNIQUET SINGLE 44IN (TOURNIQUET CUFF) IMPLANT
DECANTER SPIKE VIAL GLASS SM (MISCELLANEOUS) ×3 IMPLANT
DRAPE EXTREMITY T 121X128X90 (DRAPE) ×3 IMPLANT
DRAPE HALF SHEET 40X57 (DRAPES) ×6 IMPLANT
DRAPE U-SHAPE 47X51 STRL (DRAPES) ×3 IMPLANT
DRSG AQUACEL AG ADV 3.5X10 (GAUZE/BANDAGES/DRESSINGS) ×3 IMPLANT
DRSG AQUACEL AG ADV 3.5X14 (GAUZE/BANDAGES/DRESSINGS) ×2 IMPLANT
DURAPREP 26ML APPLICATOR (WOUND CARE) ×3 IMPLANT
ELECT REM PT RETURN 9FT ADLT (ELECTROSURGICAL) ×3
ELECTRODE REM PT RTRN 9FT ADLT (ELECTROSURGICAL) ×1 IMPLANT
GLOVE BIO SURGEON STRL SZ8 (GLOVE) ×6 IMPLANT
GLOVE BIOGEL PI IND STRL 8 (GLOVE) ×2 IMPLANT
GLOVE BIOGEL PI INDICATOR 8 (GLOVE) ×4
GOWN STRL REUS W/ TWL LRG LVL3 (GOWN DISPOSABLE) ×1 IMPLANT
GOWN STRL REUS W/ TWL XL LVL3 (GOWN DISPOSABLE) ×2 IMPLANT
GOWN STRL REUS W/TWL LRG LVL3 (GOWN DISPOSABLE) ×3
GOWN STRL REUS W/TWL XL LVL3 (GOWN DISPOSABLE) ×6
HANDPIECE INTERPULSE COAX TIP (DISPOSABLE) ×3
HOOD PEEL AWAY FACE SHEILD DIS (HOOD) ×6 IMPLANT
IMMOBILIZER KNEE 22 UNIV (SOFTGOODS) ×3 IMPLANT
INSERT TIB LCS RP STD 10 (Knees) ×2 IMPLANT
KIT BASIN OR (CUSTOM PROCEDURE TRAY) ×3 IMPLANT
KIT TURNOVER KIT B (KITS) ×3 IMPLANT
MANIFOLD NEPTUNE II (INSTRUMENTS) ×3 IMPLANT
NDL HYPO 21X1 ECLIPSE (NEEDLE) ×1 IMPLANT
NEEDLE HYPO 21X1 ECLIPSE (NEEDLE) ×3 IMPLANT
NS IRRIG 1000ML POUR BTL (IV SOLUTION) ×3 IMPLANT
PACK TOTAL JOINT (CUSTOM PROCEDURE TRAY) ×3 IMPLANT
PAD ARMBOARD 7.5X6 YLW CONV (MISCELLANEOUS) ×6 IMPLANT
PATELLA DOME PFC 38MM (Knees) ×2 IMPLANT
PIN STEINMAN FIXATION KNEE (PIN) ×2 IMPLANT
SET HNDPC FAN SPRY TIP SCT (DISPOSABLE) ×1 IMPLANT
STRIP CLOSURE SKIN 1/2X4 (GAUZE/BANDAGES/DRESSINGS) ×2 IMPLANT
SUT VIC AB 0 CT1 27 (SUTURE) ×3
SUT VIC AB 0 CT1 27XBRD ANBCTR (SUTURE) ×1 IMPLANT
SUT VIC AB 2-0 CT1 27 (SUTURE) ×3
SUT VIC AB 2-0 CT1 TAPERPNT 27 (SUTURE) ×1 IMPLANT
SUT VIC AB 3-0 FS2 27 (SUTURE) ×3 IMPLANT
SUT VLOC 180 0 24IN GS25 (SUTURE) ×3 IMPLANT
SYR 50ML LL SCALE MARK (SYRINGE) ×3 IMPLANT
TIBIA MBT CEMENT (Knees) ×3 IMPLANT
TOWEL OR 17X24 6PK STRL BLUE (TOWEL DISPOSABLE) ×3 IMPLANT
TOWEL OR 17X26 10 PK STRL BLUE (TOWEL DISPOSABLE) ×3 IMPLANT
TRAY CATH 16FR W/PLASTIC CATH (SET/KITS/TRAYS/PACK) IMPLANT

## 2018-03-14 NOTE — Interval H&P Note (Signed)
History and Physical Interval Note:  03/14/2018 10:38 AM  Adrienne Rogers  has presented today for surgery, with the diagnosis of RIGHT KNEE DEGENERATIVE JOINT DISEASE  The various methods of treatment have been discussed with the patient and family. After consideration of risks, benefits and other options for treatment, the patient has consented to  Procedure(s): TOTAL KNEE ARTHROPLASTY (Right) as a surgical intervention .  The patient's history has been reviewed, patient examined, no change in status, stable for surgery.  I have reviewed the patient's chart and labs.  Questions were answered to the patient's satisfaction.     [name] G

## 2018-03-14 NOTE — Op Note (Signed)
PREOP DIAGNOSIS: DJD RIGHT KNEE POSTOP DIAGNOSIS: same PROCEDURE: RIGHT TKR ANESTHESIA: Spinal and MAC ATTENDING SURGEON: Roshad Hack G ASSISTANT: Loni Dolly PA  INDICATIONS FOR PROCEDURE: Adrienne Rogers is a 77 y.o. female who has struggled for a long time with pain due to degenerative arthritis of the right knee.  The patient has failed many conservative non-operative measures and at this point has pain which limits the ability to sleep and walk.  The patient is offered total knee replacement.  Informed operative consent was obtained after discussion of possible risks of anesthesia, infection, neurovascular injury, DVT, and death.  The importance of the post-operative rehabilitation protocol to optimize result was stressed extensively with the patient.  SUMMARY OF FINDINGS AND PROCEDURE:  Adrienne Rogers was taken to the operative suite where under the above anesthesia a right knee replacement was performed.  There were advanced degenerative changes and the bone quality was good.  We used the DePuy LCS system and placed size standard femur, 3 tibia, 38 mm all polyethylene patella, and a size 10 mm spacer.  Loni Dolly PA-C assisted throughout and was invaluable to the completion of the case in that he helped retract and maintain exposure while I placed components.  He also helped close thereby minimizing OR time.  The patient was admitted for appropriate post-op care to include perioperative antibiotics and mechanical and pharmacologic measures for DVT prophylaxis.  DESCRIPTION OF PROCEDURE:  Adrienne Rogers was taken to the operative suite where the above anesthesia was applied.  The patient was positioned supine and prepped and draped in normal sterile fashion.  An appropriate time out was performed.  After the administration of kefzol pre-op antibiotic the leg was elevated and exsanguinated and a tourniquet inflated. A standard longitudinal incision was made on the anterior knee.   Dissection was carried down to the extensor mechanism.  All appropriate anti-infective measures were used including the pre-operative antibiotic, betadine impregnated drape, and closed hooded exhaust systems for each member of the surgical team.  A medial parapatellar incision was made in the extensor mechanism and the knee cap flipped and the knee flexed.  Some residual meniscal tissues were removed along with any remaining ACL/PCL tissue.  A guide was placed on the tibia and a flat cut was made on it's superior surface.  An intramedullary guide was placed in the femur and was utilized to make anterior and posterior cuts creating an appropriate flexion gap.  A second intramedullary guide was placed in the femur to make a distal cut properly balancing the knee with an extension gap equal to the flexion gap.  The three bones sized to the above mentioned sizes and the appropriate guides were placed and utilized.  A trial reduction was done and the knee easily came to full extension and the patella tracked well on flexion.  The trial components were removed and all bones were cleaned with pulsatile lavage and then dried thoroughly.  Cement was mixed and was pressurized onto the bones followed by placement of the aforementioned components.  Excess cement was trimmed and pressure was held on the components until the cement had hardened.  The tourniquet was deflated and a small amount of bleeding was controlled with cautery and pressure.  The knee was irrigated thoroughly.  The extensor mechanism was re-approximated with V-loc suture in running fashion.  The knee was flexed and the repair was solid.  The subcutaneous tissues were re-approximated with #0 and #2-0 vicryl and the skin closed with a  subcuticular stitch and steristrips.  A sterile dressing was applied.  Intraoperative fluids, EBL, and tourniquet time can be obtained from anesthesia records.  DISPOSITION:  The patient was taken to recovery room in stable  condition and admitted for appropriate post-op care to include peri-operative antibiotic and DVT prophylaxis with mechanical and pharmacologic measures.  Karra Pink G 03/14/2018, 1:08 PM

## 2018-03-14 NOTE — Anesthesia Postprocedure Evaluation (Signed)
Anesthesia Post Note  Patient: Adrienne Rogers  Procedure(s) Performed: TOTAL KNEE ARTHROPLASTY (Right Knee)     Patient location during evaluation: PACU Anesthesia Type: Spinal Level of consciousness: awake Pain management: pain level controlled Vital Signs Assessment: post-procedure vital signs reviewed and stable Respiratory status: spontaneous breathing Cardiovascular status: stable Postop Assessment: spinal receding Anesthetic complications: no    Last Vitals:  Vitals:   03/14/18 1726 03/14/18 1741  BP: (!) 151/76 (!) 147/64  Pulse: 66 74  Resp: 11 17  Temp:    SpO2: 98% 100%    Last Pain:  Vitals:   03/14/18 1741  TempSrc:   PainSc: 2                  Kiptyn Rafuse

## 2018-03-14 NOTE — Anesthesia Procedure Notes (Signed)
Spinal  Patient location during procedure: OR Start time: 03/14/2018 11:40 AM End time: 03/14/2018 11:55 AM Staffing Anesthesiologist: Dorris Singh, MD Performed: anesthesiologist  Preanesthetic Checklist Completed: patient identified, site marked, surgical consent, pre-op evaluation, timeout performed, IV checked, risks and benefits discussed and monitors and equipment checked Spinal Block Patient position: sitting Prep: ChloraPrep Patient monitoring: heart rate, cardiac monitor, continuous pulse ox and blood pressure Location: L3-4 Injection technique: single-shot Needle Needle type: Quincke  Needle gauge: 22 G Assessment Sensory level: T10 Additional Notes Clear CSF spinal marcaine (0.75%)  1.8cc Exp checked. Patient tolerated procedure well.

## 2018-03-14 NOTE — Anesthesia Preprocedure Evaluation (Signed)
Anesthesia Evaluation  Patient identified by MRN, date of birth, ID band Patient awake    Airway Mallampati: II  TM Distance: >3 FB     Dental   Pulmonary asthma , COPD,    breath sounds clear to auscultation       Cardiovascular hypertension,  Rhythm:Regular Rate:Normal     Neuro/Psych  Neuromuscular disease    GI/Hepatic negative GI ROS, Neg liver ROS,   Endo/Other  negative endocrine ROS  Renal/GU negative Renal ROS     Musculoskeletal  (+) Arthritis ,   Abdominal   Peds  Hematology   Anesthesia Other Findings   Reproductive/Obstetrics                             Anesthesia Physical Anesthesia Plan  ASA: III  Anesthesia Plan: Spinal   Post-op Pain Management:  Regional for Post-op pain   Induction: Intravenous  PONV Risk Score and Plan: 2 and Ondansetron, Dexamethasone and Midazolam  Airway Management Planned: Simple Face Mask and Nasal Cannula  Additional Equipment:   Intra-op Plan:   Post-operative Plan:   Informed Consent: I have reviewed the patients History and Physical, chart, labs and discussed the procedure including the risks, benefits and alternatives for the proposed anesthesia with the patient or authorized representative who has indicated his/her understanding and acceptance.   Dental advisory given  Plan Discussed with: CRNA and Anesthesiologist  Anesthesia Plan Comments:         Anesthesia Quick Evaluation

## 2018-03-14 NOTE — Transfer of Care (Signed)
Immediate Anesthesia Transfer of Care Note  Patient: Adrienne Rogers  Procedure(s) Performed: TOTAL KNEE ARTHROPLASTY (Right Knee)  Patient Location: PACU  Anesthesia Type:MAC and Spinal  Level of Consciousness: patient cooperative and responds to stimulation  Airway & Oxygen Therapy: Patient Spontanous Breathing and Patient connected to nasal cannula oxygen  Post-op Assessment: Report given to RN and Post -op Vital signs reviewed and stable  Post vital signs: Reviewed and stable  Last Vitals:  Vitals Value Taken Time  BP 128/63 03/14/2018  1:56 PM  Temp    Pulse 57 03/14/2018  2:02 PM  Resp 16 03/14/2018  2:02 PM  SpO2 99 % 03/14/2018  2:02 PM  Vitals shown include unvalidated device data.  Last Pain:  Vitals:   03/14/18 1341  TempSrc:   PainSc: 0-No pain         Complications: No apparent anesthesia complications

## 2018-03-14 NOTE — Anesthesia Procedure Notes (Signed)
Anesthesia Regional Block: Adductor canal block   Pre-Anesthetic Checklist: ,, timeout performed, Correct Patient, Correct Site, Correct Laterality, Correct Procedure, Correct Position, site marked, Risks and benefits discussed,  Surgical consent,  Pre-op evaluation,  At surgeon's request and post-op pain management  Laterality: Right  Prep: Maximum Sterile Barrier Precautions used, chloraprep       Needles:  Injection technique: Single-shot  Needle Type: Echogenic Needle     Needle Length: 9cm  Needle Gauge: 21     Additional Needles:   Procedures:,,,, ultrasound used (permanent image in chart),,,,  Narrative:  Start time: 03/14/2018 11:11 AM End time: 03/14/2018 11:26 AM Injection made incrementally with aspirations every 5 mL.  Performed by: Personally  Anesthesiologist: Trevor Iha, MD

## 2018-03-14 NOTE — Plan of Care (Signed)

## 2018-03-15 ENCOUNTER — Encounter (HOSPITAL_COMMUNITY): Payer: Self-pay | Admitting: General Practice

## 2018-03-15 DIAGNOSIS — J449 Chronic obstructive pulmonary disease, unspecified: Secondary | ICD-10-CM | POA: Diagnosis present

## 2018-03-15 DIAGNOSIS — Z79899 Other long term (current) drug therapy: Secondary | ICD-10-CM | POA: Diagnosis not present

## 2018-03-15 DIAGNOSIS — M1711 Unilateral primary osteoarthritis, right knee: Secondary | ICD-10-CM | POA: Diagnosis present

## 2018-03-15 DIAGNOSIS — E785 Hyperlipidemia, unspecified: Secondary | ICD-10-CM | POA: Diagnosis present

## 2018-03-15 DIAGNOSIS — M25561 Pain in right knee: Secondary | ICD-10-CM | POA: Diagnosis present

## 2018-03-15 DIAGNOSIS — F4310 Post-traumatic stress disorder, unspecified: Secondary | ICD-10-CM | POA: Diagnosis present

## 2018-03-15 DIAGNOSIS — Z7982 Long term (current) use of aspirin: Secondary | ICD-10-CM | POA: Diagnosis not present

## 2018-03-15 DIAGNOSIS — M5441 Lumbago with sciatica, right side: Secondary | ICD-10-CM | POA: Diagnosis present

## 2018-03-15 DIAGNOSIS — I10 Essential (primary) hypertension: Secondary | ICD-10-CM | POA: Diagnosis present

## 2018-03-15 DIAGNOSIS — E669 Obesity, unspecified: Secondary | ICD-10-CM | POA: Diagnosis present

## 2018-03-15 DIAGNOSIS — Z6831 Body mass index (BMI) 31.0-31.9, adult: Secondary | ICD-10-CM | POA: Diagnosis not present

## 2018-03-15 DIAGNOSIS — Z7951 Long term (current) use of inhaled steroids: Secondary | ICD-10-CM | POA: Diagnosis not present

## 2018-03-15 MED ORDER — ATORVASTATIN CALCIUM 20 MG PO TABS
20.0000 mg | ORAL_TABLET | Freq: Every day | ORAL | Status: DC
  Administered 2018-03-15: 20 mg via ORAL
  Filled 2018-03-15: qty 1

## 2018-03-15 MED ORDER — HYDROCODONE-ACETAMINOPHEN 5-325 MG PO TABS: 1.0000 | ORAL_TABLET | ORAL | 0 refills | Status: DC | PRN

## 2018-03-15 MED ORDER — ASPIRIN 325 MG PO TBEC: 325.0000 mg | DELAYED_RELEASE_TABLET | Freq: Two times a day (BID) | ORAL | 0 refills | Status: DC

## 2018-03-15 MED ORDER — TIZANIDINE HCL 4 MG PO TABS: 4.0000 mg | ORAL_TABLET | Freq: Four times a day (QID) | ORAL | 1 refills | Status: AC | PRN

## 2018-03-15 NOTE — Progress Notes (Signed)
Subjective: 1 Day Post-Op Procedure(s) (LRB): TOTAL KNEE ARTHROPLASTY (Right)  Activity level:  wbat Diet tolerance:  ok Voiding:  ok Patient reports pain as mild.    Objective: Vital signs in last 24 hours: Temp:  [97.2 F (36.2 C)-98 F (36.7 C)] 98 F (36.7 C) (09/11 0233) Pulse Rate:  [58-74] 72 (09/11 0233) Resp:  [9-20] 16 (09/11 0233) BP: (115-167)/(53-101) 136/66 (09/11 0233) SpO2:  [97 %-100 %] 98 % (09/11 0233) Weight:  [83 kg] 83 kg (09/10 1002)  Labs: No results for input(s): HGB in the last 72 hours. No results for input(s): WBC, RBC, HCT, PLT in the last 72 hours. No results for input(s): NA, K, CL, CO2, BUN, CREATININE, GLUCOSE, CALCIUM in the last 72 hours. No results for input(s): LABPT, INR in the last 72 hours.  Physical Exam:  Neurologically intact ABD soft Neurovascular intact Sensation intact distally Intact pulses distally Dorsiflexion/Plantar flexion intact Incision: dressing C/D/I and no drainage No cellulitis present  Assessment/Plan:  1 Day Post-Op Procedure(s) (LRB): TOTAL KNEE ARTHROPLASTY (Right) Advance diet Up with therapy Plan for discharge tomorrow if doing well and cleared by PT. Patient was considering going to Rehab but if she is doing well she would ike to go home. She is going to make some calls to see about family helping her at home. Continue on ASA 325mg  BID x 2 weeks post op. Follow up in office 2 weeks post op. Anticipated LOS equal to or greater than 2 midnights due to - Age 76 and older with one or more of the following:  - Obesity  - Expected need for hospital services (PT, OT, Nursing) required for safe  discharge  - Anticipated need for postoperative skilled nursing care or inpatient rehab  - Active co-morbidities: None OR   - Unanticipated findings during/Post Surgery: Slow post-op progression: GI, pain control, mobility  - Patient is a high risk of re-admission due to: Barriers to post-acute care (logistical,  no family support in home)  Shakira Los, Ginger Organ 03/15/2018, 7:48 AM

## 2018-03-15 NOTE — Progress Notes (Signed)
CSW spoke with patient. She was alert and fully oriented.CSW discussed discharge plan regarding recommendation of SNF placement at  [facility].Patient informed CSW that she was going home as disucssed with her doctor. Patient states that her sister lives out of town but will be here on Friday. Patient expressed she wants to continue to work with PT so that she can increase her mobility. CSW updated the [facility] of discussion with the patient.   Antony Blackbird, [facility] Clinical Social Worker [phone]

## 2018-03-15 NOTE — Care Management Note (Signed)
Case Management Note  Patient Details  Name: Adrienne Rogers MRN: [id number] Date of Birth: 1940/07/08  Subjective/Objective:   Right TKA                 Action/Plan: Pt agreeable to SNF, CSW referral for SNF placement.   (Note per [facility] with patient. She states that her sister can no longer come to take help her initially after surgery. She will need STSNF. I have spoke with admissions at [facility] and they are expecting her. She is in agreeable with this plan. She is planning to have someone transport her.  Her DME will be determined prior to discharge from the SNF. )  Expected Discharge Date:                  Expected Discharge Plan:  Skilled Nursing Facility  In-House Referral:  Clinical Social Work  Discharge planning Services  CM Consult  Post Acute Care Choice:  NA Choice offered to:  NA  DME Arranged:  N/A DME Agency:  NA  HH Arranged:  NA HH Agency:  NA  Status of Service:  Completed, signed off  If discussed at [employer] of Stay Meetings, dates discussed:    Additional Comments:  Elliot Cousin, RN 03/15/2018, 12:28 PM

## 2018-03-15 NOTE — Care Plan (Signed)
Spoke with patient. She states that her sister can no longer come to take help her initially after surgery. She will need STSNF. I have spoke with admissions at [facility] and they are expecting her. She is in agreeable with this plan. She is planning to have someone transport her.  Her DME will be determined prior to discharge from the SNF.   Please contact Shauna Hugh, RNCM [phone] with questions.

## 2018-03-15 NOTE — Progress Notes (Signed)
Physical Therapy Evaluation Patient Details Name: Adrienne Rogers MRN: [id number] DOB: [birth year] Today's Date: 03/15/2018   History of Present Illness  Pt is a 77 y.o. female s/p R TKA. Pt is WBAT on RLE. PMH is significant for COPD, PTSD, HTN, and HLD.  Clinical Impression  Patient is s/p above surgery resulting in deficits listed below (see PT Problem List). At time of evaluation pt performed ambulation with gross min G with RW for safety. Pt transferred back to room in chair s/p ambulation secondary to fatigue, pain in RLE, and reports of mild dizziness. Upon return to room pt performed generalized RLE exercises seated in chair, with good tolerance. Educated on overall safety with mobility and positioning. Pt appears fearful returning to home at d/c due to decreased caregiver support. Pt reports she would like to d/c to SNF and feel this is reasonable due to decreased activity tolerance and decreased support at d/c. Will continue to follow while admitted to increase her independence and safety with mobility to allow discharge to the venue listed below.       Follow Up Recommendations Follow surgeon's recommendation for DC plan and follow-up therapies    Equipment Recommendations  Rolling walker with 5" wheels    Recommendations for Other Services OT consult     Precautions / Restrictions Precautions Precautions: Knee;Fall Precaution Comments: Reviewed weight bearing precautions with pt.  Restrictions Weight Bearing Restrictions: Yes RLE Weight Bearing: Weight bearing as tolerated      Mobility  Bed Mobility Overal bed mobility: Needs Assistance Bed Mobility: Sit to Supine       Sit to supine: Min guard;HOB elevated   General bed mobility comments: HOB elevated to simulate home environment as she has an adjustable bed. Increased time and effort noted with sit>supine. No assist required to lower RLE to ground.   Transfers Overall transfer level: Needs  assistance Equipment used: Rolling walker (2 wheeled) Transfers: Sit to/from Stand Sit to Stand: Min guard; Min assist         General transfer comment: Min cues for proper hand placement. Steadying required upon standing.  Ambulation/Gait Ambulation/Gait assistance: Min guard Gait Distance (Feet): 35 Feet Assistive device: Rolling walker (2 wheeled) Gait Pattern/deviations: Step-to pattern;Decreased step length - right;Decreased stance time - right;Decreased dorsiflexion - right;Decreased weight shift to right;Trunk flexed;Antalgic Gait velocity: decreased Gait velocity interpretation: <1.8 ft/sec, indicate of risk for recurrent falls General Gait Details: Pt slow and guarded throughout. VCs required for upright posture and proximity to RW. Min cues for decreased step length on RLE and use of BIL UE with weight shift on RLE to encourage step-through gait pattern. Pt reports increased pain when stepping onto RLE attempting to slide RLE along floor.   Stairs            Wheelchair Mobility    Modified Rankin (Stroke Patients Only)       Balance Overall balance assessment: Needs assistance Sitting-balance support: No upper extremity supported;Feet supported Sitting balance-Leahy Scale: Good     Standing balance support: Bilateral upper extremity supported;During functional activity Standing balance-Leahy Scale: Poor Standing balance comment: Reliant on BIL UE support                             Pertinent Vitals/Pain Pain Assessment: Faces Faces Pain Scale: Hurts even more Pain Location: R knee Pain Descriptors / Indicators: Discomfort;Grimacing;Operative site guarding;Pounding Pain Intervention(s): Limited activity within patient's tolerance;Monitored during session;Repositioned  Home Living Family/patient expects to be discharged to:: Skilled nursing facility Living Arrangements: Alone Available Help at Discharge: Family;Available 24 hours/day Type  of Home: House Home Access: Other (comment)(curb)     Home Layout: Two level;Able to live on main level with bedroom/bathroom Home Equipment: Walker - 4 wheels;Cane - single point Additional Comments: Pt has 2 level house but rarely goes to the second level.     Prior Function Level of Independence: Independent;Independent with assistive device(s)         Comments: Utilized SPC on occasions secondary to R knee pain.      Hand Dominance        Extremity/Trunk Assessment   Upper Extremity Assessment Upper Extremity Assessment: Generalized weakness    Lower Extremity Assessment Lower Extremity Assessment: RLE deficits/detail RLE Deficits / Details: s/p TKA    Cervical / Trunk Assessment Cervical / Trunk Assessment: Kyphotic  Communication   Communication: No difficulties  Cognition Arousal/Alertness: Awake/alert Behavior During Therapy: Anxious Overall Cognitive Status: Within Functional Limits for tasks assessed                                        General Comments General comments (skin integrity, edema, etc.): Pt reports she is fearful to d/c home based on fatigue and pain levels she endured with gait     Exercises Total Joint Exercises Ankle Circles/Pumps: AROM;Both;10 reps Quad Sets: AROM;Right;10 reps Hip ABduction/ADduction: AROM;Right;10 reps Goniometric ROM: knee ext: lacking 8 deg; knee flex: 76 deg   Assessment/Plan    PT Assessment Patient needs continued PT services  PT Problem List Decreased strength;Decreased activity tolerance;Decreased range of motion;Decreased balance;Decreased mobility;Decreased coordination;Decreased knowledge of use of DME;Decreased safety awareness;Decreased knowledge of precautions;Pain       PT Treatment Interventions DME instruction;Gait training;Stair training;Functional mobility training;Therapeutic activities;Therapeutic exercise;Balance training;Patient/family education;Modalities    PT Goals  (Current goals can be found in the Care Plan section)  Acute Rehab PT Goals Patient Stated Goal: to walk farther  PT Goal Formulation: With patient Time For Goal Achievement: 03/22/18 Potential to Achieve Goals: Good    Frequency 7X/week   Barriers to discharge Decreased caregiver support Sister and brother-n-law able to come stay at d/c if has to go home, but unable to arrive until 9/13. Would need to contact other family/friends to arrange help if d/c on 9/12. Pt reports she would like to go to SNF.     Co-evaluation               AM-PAC PT "6 Clicks" Daily Activity  Outcome Measure Difficulty turning over in bed (including adjusting bedclothes, sheets and blankets)?: A Little Difficulty moving from lying on back to sitting on the side of the bed? : A Little Difficulty sitting down on and standing up from a chair with arms (e.g., wheelchair, bedside commode, etc,.)?: Unable Help needed moving to and from a bed to chair (including a wheelchair)?: A Little Help needed walking in hospital room?: A Little Help needed climbing 3-5 steps with a railing? : A Lot 6 Click Score: 15    End of Session Equipment Utilized During Treatment: Gait belt Activity Tolerance: Patient limited by pain;Patient limited by fatigue Patient left: in chair;with call bell/phone within reach;with chair alarm set Nurse Communication: Mobility status;Other (comment)(need for new gown and pure wick removal) PT Visit Diagnosis: Unsteadiness on feet (R26.81);Other abnormalities of gait and mobility (R26.89);Muscle weakness (  generalized) (M62.81);Difficulty in walking, not elsewhere classified (R26.2);Dizziness and giddiness (R42);Pain Pain - Right/Left: Right Pain - part of body: Knee    Time: 1610-9604 PT Time Calculation (min) (ACUTE ONLY): 37 min   Charges:   PT Evaluation $PT Eval Moderate Complexity: 1 Mod PT Treatments $Gait Training: 8-22 mins        Donzetta Kohut, [place]  Student Physical  Therapist Acute Rehab [phone]   Donzetta Kohut 03/15/2018, 1:37 PM

## 2018-03-15 NOTE — Clinical Social Work Note (Signed)
Clinical Social Work Assessment  Patient Details  Name: FRANCE POWE MRN: [id number] Date of Birth: [birth year]  Date of referral:  03/15/18               Reason for consult:  Facility Placement                Permission sought to share information with:  Facility Industrial/product designer granted to share information::     Name::        Agency::     Relationship::     Contact Information:     Housing/Transportation Living arrangements for the past 2 months:  Single Family Home Source of Information:  Patient Patient Interpreter Needed:  None Criminal Activity/Legal Involvement Pertinent to Current Situation/Hospitalization:  No - Comment as needed Significant Relationships:  Adult Children Lives with:  Self Do you feel safe going back to the place where you live?  No Need for family participation in patient care:  Yes (Comment)  Care giving concerns:  CSW received consult for discharge needs. CSW spoke with patient regarding PT recommendation of SNF placement at time of discharge. Patient reports that she lives alone but her sister is expected to arrive on 03/17/18 to help her meet her needs. CSW inquired if the patient wanted to go to SNF. Patient states she preferred to stay another day or two in the hospital so that PT can work with her to increase her mobility with her walker and her ability to stand on her own. After increased PT patient explained she would feel more comfortable going home.    Social Worker assessment / plan:  CSW spoke with patient concerning possibility of rehab at [facility] before returning home.   Employment status:  Retired Health and safety inspector:  Medicare PT Recommendations:  Skilled Nursing Facility Information / Referral to community resources:  Skilled Nursing Facility  Patient/Family's Response to care: Patient recognizes need for rehab but did not appear to be certain about her discharge plan. CSW informed the RNCM that patient was  anticipating on going home and not to a SNF.  Patient/Family's Understanding of and Emotional Response to Diagnosis, Current Treatment, and Prognosis: Patient is realistic regarding therapy needs and expressed her willingness and desire to aggressively work with PT so that she can stand alone and increase her mobility. Patient expressed understanding of CSW role and discharge process as well as medical condition. No questions/concerns about plan or treatment. CSW will continue to provide support and facilitate discharge needs.   Emotional Assessment Appearance:  Appears stated age Attitude/Demeanor/Rapport:  Engaged Affect (typically observed):  Apprehensive, Accepting, Hopeful Orientation:  Oriented to Self, Oriented to Place, Oriented to  Time, Oriented to Situation Alcohol / Substance use:  Not Applicable Psych involvement (Current and /or in the community):  No (Comment)  Discharge Needs  Concerns to be addressed:  Care Coordination Readmission within the last 30 days:  Yes Current discharge risk:  Dependent with Mobility, Lives alone Barriers to Discharge:  Continued Medical Work up   [employer], LCSWA 03/15/2018, 6:04 PM

## 2018-03-16 MED ORDER — HYDROMORPHONE HCL 1 MG/ML IJ SOLN
INTRAMUSCULAR | Status: AC
  Administered 2018-03-16: 1 mg via INTRAVENOUS
  Filled 2018-03-16: qty 1

## 2018-03-16 MED ORDER — HYDROMORPHONE HCL 1 MG/ML IJ SOLN
0.5000 mg | INTRAMUSCULAR | Status: DC | PRN
  Administered 2018-03-16 (×2): 1 mg via INTRAVENOUS
  Filled 2018-03-16: qty 1

## 2018-03-16 NOTE — [place] FL2 (Signed)
[place] MEDICAID FL2 LEVEL OF CARE SCREENING TOOL     IDENTIFICATION  Patient Name: Adrienne Rogers Birthdate: Apr 04, 1941 Sex: female Admission Date (Current Location): 03/14/2018  [facility] and [place] Number:  Producer, television/film/video and Address:  [facility], [address], [place], [place] [address]      Provider Number: [id number]  Attending Physician Name and Address:  Marcene Corning, MD  Relative Name and Phone Number:  Bartholomew Crews (daughter) [phone]    Current Level of Care: Hospital Recommended Level of Care: Skilled Nursing Facility Prior Approval Number:    Date Approved/Denied:   PASRR Number:    Discharge Plan: SNF    Current Diagnoses: Patient Active Problem List   Diagnosis Date Noted  . Primary osteoarthritis of right knee 03/14/2018  . Bilateral primary osteoarthritis of knee 12/15/2017  . Primary osteoarthritis of left knee 08/16/2016  . Chronic pain of right knee 06/16/2016  . Chronic right-sided low back pain with right-sided sciatica 06/16/2016    Orientation RESPIRATION BLADDER Height & Weight     Self, Time, Situation, Place  Normal Continent, External catheter Weight: 183 lb (83 kg) Height:     BEHAVIORAL SYMPTOMS/MOOD NEUROLOGICAL BOWEL NUTRITION STATUS      Continent Diet(See discharge notes)  AMBULATORY STATUS COMMUNICATION OF NEEDS Skin   Limited Assist Verbally Surgical wounds, Other (Comment)(Closed incision right leg)                       Personal Care Assistance Level of Assistance  Bathing, Feeding, Dressing, Total care Bathing Assistance: Limited assistance Feeding assistance: Independent Dressing Assistance: Limited assistance Total Care Assistance: Limited assistance   Functional Limitations Info  Sight, Hearing, Speech Sight Info: Adequate Hearing Info: Adequate Speech Info: Adequate    SPECIAL CARE FACTORS FREQUENCY  PT (By licensed PT), OT (By licensed OT)     PT Frequency:  2x weekly OT Frequency: 2x weekly            Contractures Contractures Info: Not present    Additional Factors Info  Code Status, Allergies Code Status Info: Full Allergies Info: Allergies:  Lisinopril, Ezetimibe-simvastatin, Fosamax Alendronate Sodium, Sulfa Antibiotics, Eggs Or Egg-derived Products           Current Medications (03/16/2018):  This is the current hospital active medication list Current Facility-Administered Medications  Medication Dose Route Frequency Provider Last Rate Last Dose  . acetaminophen (TYLENOL) tablet 325-650 mg  325-650 mg Oral Q6H PRN [name], PA-C      . albuterol (PROVENTIL) (2.5 MG/3ML) 0.083% nebulizer solution 3 mL  3 mL Inhalation Q6H PRN [name], PA-C      . alum & mag hydroxide-simeth (MAALOX/MYLANTA) 200-200-20 MG/5ML suspension 30 mL  30 mL Oral Q4H PRN [name], PA-C      . amLODipine (NORVASC) tablet 10 mg  10 mg Oral Daily [name], PA-C   10 mg at 03/15/18 1013  . aspirin EC tablet 325 mg  325 mg Oral BID PC [name], PA-C   325 mg at 03/15/18 1855  . atorvastatin (LIPITOR) tablet 20 mg  20 mg Oral QHS Marcene Corning, MD   20 mg at 03/15/18 2242  . bisacodyl (DULCOLAX) EC tablet 5 mg  5 mg Oral Daily PRN [name], PA-C      . buPROPion [facility] SR) 12 hr tablet 150 mg  150 mg Oral Daily [name], PA-C   150 mg at 03/15/18 2043  .  diphenhydrAMINE (BENADRYL) 12.5 MG/5ML elixir 12.5-25 mg  12.5-25 mg Oral Q4H PRN Elodia FlorenceNida, Andrew, PA-C      . docusate sodium (COLACE) capsule 100 mg  100 mg Oral BID Elodia Florenceida, Andrew, PA-C   100 mg at 03/15/18 2042  . FLUoxetine (PROZAC) capsule 60 mg  60 mg Oral Daily Elodia Florenceida, Andrew, PA-C   60 mg at 03/15/18 1008  . fluticasone furoate-vilanterol (BREO ELLIPTA) 200-25 MCG/INH 1 puff  1 puff Inhalation Daily Elodia Florenceida, Andrew, PA-C   1 puff at 03/16/18 0925  . furosemide (LASIX) tablet 20 mg  20 mg Oral Daily Elodia Florenceida, Andrew, PA-C   20 mg at 03/15/18 1012  . HYDROcodone-acetaminophen (NORCO)  7.5-325 MG per tablet 1-2 tablet  1-2 tablet Oral Q4H PRN Elodia FlorenceNida, Andrew, PA-C   2 tablet at 03/15/18 0008  . HYDROcodone-acetaminophen (NORCO/VICODIN) 5-325 MG per tablet 1-2 tablet  1-2 tablet Oral Q4H PRN Elodia Florenceida, Andrew, PA-C   2 tablet at 03/15/18 2041  . HYDROmorphone (DILAUDID) injection 0.5-1 mg  0.5-1 mg Intravenous Q3H PRN Marcene Corningalldorf, Peter, MD   1 mg at 03/16/18 0731  . ketotifen (ZADITOR) 0.025 % ophthalmic solution 1 drop  1 drop Both Eyes PRN Elodia FlorenceNida, Andrew, PA-C      . lactated ringers infusion   Intravenous Continuous Elodia Florenceida, Andrew, PA-C 50 mL/hr at 03/15/18 1443    . losartan (COZAAR) tablet 100 mg  100 mg Oral Daily Elodia Florenceida, Andrew, PA-C   100 mg at 03/15/18 1005  . menthol-cetylpyridinium (CEPACOL) lozenge 3 mg  1 lozenge Oral PRN Elodia FlorenceNida, Andrew, PA-C       Or  . phenol (CHLORASEPTIC) mouth spray 1 spray  1 spray Mouth/Throat PRN Elodia FlorenceNida, Andrew, PA-C      . methocarbamol (ROBAXIN) tablet 500 mg  500 mg Oral Q6H PRN Elodia FlorenceNida, Andrew, PA-C   500 mg at 03/15/18 1621   Or  . methocarbamol (ROBAXIN) 500 mg in dextrose 5 % 50 mL IVPB  500 mg Intravenous Q6H PRN Elodia FlorenceNida, Andrew, PA-C      . metoCLOPramide (REGLAN) tablet 5-10 mg  5-10 mg Oral Q8H PRN Elodia FlorenceNida, Andrew, PA-C       Or  . metoCLOPramide (REGLAN) injection 5-10 mg  5-10 mg Intravenous Q8H PRN Elodia FlorenceNida, Andrew, PA-C      . morphine 2 MG/ML injection 0.5-1 mg  0.5-1 mg Intravenous Q2H PRN Elodia FlorenceNida, Andrew, PA-C   0.5 mg at 03/15/18 1630  . ondansetron (ZOFRAN) tablet 4 mg  4 mg Oral Q6H PRN Elodia FlorenceNida, Andrew, PA-C       Or  . ondansetron [facility](ZOFRAN) injection 4 mg  4 mg Intravenous Q6H PRN Elodia FlorenceNida, Andrew, PA-C   4 mg at 03/16/18 0858  . pantoprazole (PROTONIX) EC tablet 80 mg  80 mg Oral Daily Elodia Florenceida, Andrew, PA-C   80 mg at 03/15/18 1008  . traZODone (DESYREL) tablet 50 mg  50 mg Oral QHS Elodia Florenceida, Andrew, PA-C   50 mg at 03/15/18 2043     Discharge Medications: Please see discharge summary for a list of discharge medications.  Relevant Imaging Results:  Relevant  Lab Results:   Additional Information SSN: [id number]  Gildardo GriffesAshley M Ceniyah Thorp, LCSW

## 2018-03-16 NOTE — Clinical Social Work Placement (Signed)
   CLINICAL SOCIAL WORK PLACEMENT  NOTE  Date:  03/16/2018  Patient Details  Name: Belva AgeeBernice L Bagg MRN: [id number] Date of Birth: Mar 09, 1941  Clinical Social Work is seeking post-discharge placement for this patient at the Skilled  Nursing Facility level of care (*CSW will initial, date and re-position this form in  chart as items are completed):  Yes   Patient/family provided with [facility] Clinical Social Work Department's list of facilities offering this level of care within the geographic area requested by the patient (or if unable, by the patient's family).  Yes   Patient/family informed of their freedom to choose among providers that offer the needed level of care, that participate in Medicare, Medicaid or managed care program needed by the patient, have an available bed and are willing to accept the patient.  Yes   Patient/family informed of [facility]'s ownership interest in [facility] and [facility], as well as of the fact that they are under no obligation to receive care at these facilities.  PASRR submitted to EDS on 03/16/18     PASRR number received on 03/16/18     Existing PASRR number confirmed on       FL2 transmitted to all facilities in geographic area requested by pt/family on 03/16/18     FL2 transmitted to all facilities within larger geographic area on       Patient informed that his/her managed care company has contracts with or will negotiate with certain facilities, including the following:        Yes   Patient/family informed of bed offers received.  [facility]     Physician recommends and patient chooses bed at      [facility] on 03/16/18.  Patient to be transferred to facility by [facility]     Patient family notified on 03/16/18 of transfer.  Name of family member notified:  Pt declined     PHYSICIAN       Additional Comment:     _______________________________________________ Gildardo GriffesAshley M Leilanni Halvorson, LCSW 03/16/2018, 4:09 PM

## 2018-03-16 NOTE — Progress Notes (Signed)
[facility]. [facility]. Awaiting transportation.

## 2018-03-16 NOTE — Progress Notes (Signed)
Subjective: 2 Days Post-Op Procedure(s) (LRB): TOTAL KNEE ARTHROPLASTY (Right)   Patient feeling a little better today. She is hoping to go to SNF today.  Activity level:  wbat Diet tolerance:  ok Voiding:  ok Patient reports pain as mild and moderate.    Objective: Vital signs in last 24 hours: Temp:  [97.9 F (36.6 C)-98.4 F (36.9 C)] 97.9 F (36.6 C) (09/12 0416) Pulse Rate:  [59-66] 65 (09/12 0925) Resp:  [16] 16 (09/12 0925) BP: (125-153)/(57-74) 128/57 (09/12 0416) SpO2:  [91 %-99 %] 96 % (09/12 0925)  Labs: No results for input(s): HGB in the last 72 hours. No results for input(s): WBC, RBC, HCT, PLT in the last 72 hours. No results for input(s): NA, K, CL, CO2, BUN, CREATININE, GLUCOSE, CALCIUM in the last 72 hours. No results for input(s): LABPT, INR in the last 72 hours.  Physical Exam:  Neurologically intact ABD soft Neurovascular intact Sensation intact distally Intact pulses distally Dorsiflexion/Plantar flexion intact Incision: dressing C/D/I and no drainage No cellulitis present Compartment soft  Assessment/Plan:  2 Days Post-Op Procedure(s) (LRB): TOTAL KNEE ARTHROPLASTY (Right) Advance diet Up with therapy Discharge to SNF today. Continue on ASA 325mg  BID x 2 weeks post op for DVT prevention. Follow up in office 2 weeks post op. Anticipated LOS equal to or greater than 2 midnights due to - Age 76 and older with one or more of the following:  - Obesity  - Expected need for hospital services (PT, OT, Nursing) required for safe  discharge  - Anticipated need for postoperative skilled nursing care or inpatient rehab  - Active co-morbidities: None OR   - Unanticipated findings during/Post Surgery: Slow post-op progression: GI, pain control, mobility  - Patient is a high risk of re-admission due to: Barriers to post-acute care (logistical, no family support in home)  Enrigue Hashimi, Ginger OrganNDREW PAUL 03/16/2018, 1:35 PM

## 2018-03-16 NOTE — Progress Notes (Signed)
Physical Therapy Treatment Patient Details Name: Adrienne Rogers MRN: [id number] DOB: 1940-10-07 Today's Date: 03/16/2018    History of Present Illness Pt is a 77 y.o. female s/p R TKA. Pt is WBAT on RLE. PMH is significant for COPD, PTSD, HTN, and HLD.    PT Comments    Pt performed gait training limited due to bout of dizziness.  Pt returned to bed to sit and BP obtained 126/77.  Pt is lethargic likely due to IV meds given this am.  Pt required supervision for bed mobility, min assistance for transfers, and min-mod assistance for gait training.  Based on functional deficits SNF remains appropriate and she is agreeable to SNF placement at this time.  Plan for continued functional mobility during next session to patient's tolerance.     Follow Up Recommendations  Follow surgeon's recommendation for DC plan and follow-up therapies     Equipment Recommendations  Rolling walker with 5" wheels    Recommendations for Other Services       Precautions / Restrictions Precautions Precautions: Knee;Fall Precaution Comments: Reviewed weight bearing precautions with pt.  Restrictions Weight Bearing Restrictions: Yes RLE Weight Bearing: Weight bearing as tolerated    Mobility  Bed Mobility Overal bed mobility: Needs Assistance Bed Mobility: Supine to Sit     Supine to sit: Supervision     General bed mobility comments: Increased time and effort to achieve sitting edge of bed.  Pt at first rolling to edge of bed but required cues to advance LLEs off of bed to avoid rolling completely off the bed.    Transfers Overall transfer level: Needs assistance Equipment used: Rolling walker (2 wheeled) Transfers: Sit to/from Stand Sit to Stand: Min guard;Min assist         General transfer comment: Cues for hand placement to and from seated surface, intially min guard but during additonal transfer she required min assistance to achieve standing.    Ambulation/Gait Ambulation/Gait  assistance: Min assist;Mod assist(intially min assistance but she require mod assistance after c/o dizziness where she became nonverbal and required increased assistance to return to bed.  ) Gait Distance (Feet): 15 Feet(+ 444ft x2.  ) Assistive device: Rolling walker (2 wheeled) Gait Pattern/deviations: Step-to pattern;Decreased step length - right;Decreased stance time - right;Decreased dorsiflexion - right;Decreased weight shift to right;Trunk flexed;Antalgic     General Gait Details: Pt required cues for upper trunk control, sequencing, assistance to turn device and assistance to shift weight.  Pt complains of dizziness and unable to keep eyes opened without verbally shoutng patients name, returned to bed and obtained BP.  126/77.  Pt remains to complain of dizziness so transferred to chair and reclined.     Stairs             Wheelchair Mobility    Modified Rankin (Stroke Patients Only)       Balance Overall balance assessment: Needs assistance Sitting-balance support: No upper extremity supported;Feet supported Sitting balance-Leahy Scale: Good       Standing balance-Leahy Scale: Poor Standing balance comment: Reliant on BIL UE support                            Cognition Arousal/Alertness: Suspect due to medications;Lethargic Behavior During Therapy: Flat affect Overall Cognitive Status: Within Functional Limits for tasks assessed  Exercises Total Joint Exercises Ankle Circles/Pumps: AROM;Both;Supine Quad Sets: AROM;Right;10 reps;Supine Heel Slides: AAROM;Right;10 reps;Supine Hip ABduction/ADduction: Right;10 reps;AAROM;Supine Straight Leg Raises: AAROM;Right;10 reps;Supine Goniometric ROM: 10-80 degrees flexion in R knee.      General Comments        Pertinent Vitals/Pain Pain Assessment: Faces Faces Pain Scale: Hurts a little bit Pain Location: R knee Pain Descriptors / Indicators:  Discomfort;Sore Pain Intervention(s): Monitored during session;Repositioned;Ice applied;Patient requesting pain meds-RN notified;Premedicated before session(Pt educated that she was premedicated this am before PT session.  Pt unable to recall and lethargic from IV pain meds.  )    Home Living                      Prior Function            PT Goals (current goals can now be found in the care plan section) Acute Rehab PT Goals Patient Stated Goal: "To go to rehab" Potential to Achieve Goals: Good Progress towards PT goals: Progressing toward goals    Frequency    7X/week      PT Plan Current plan remains appropriate    Co-evaluation              AM-PAC PT "6 Clicks" Daily Activity  Outcome Measure  Difficulty turning over in bed (including adjusting bedclothes, sheets and blankets)?: A Little Difficulty moving from lying on back to sitting on the side of the bed? : A Lot Difficulty sitting down on and standing up from a chair with arms (e.g., wheelchair, bedside commode, etc,.)?: Unable Help needed moving to and from a bed to chair (including a wheelchair)?: A Little Help needed walking in hospital room?: A Lot Help needed climbing 3-5 steps with a railing? : A Lot 6 Click Score: 13    End of Session Equipment Utilized During Treatment: Gait belt Activity Tolerance: Patient limited by pain;Patient limited by fatigue Patient left: in chair;with call bell/phone within reach;with chair alarm set Nurse Communication: Mobility status(complaints of dizziness and report of BP.  ) PT Visit Diagnosis: Unsteadiness on feet (R26.81);Other abnormalities of gait and mobility (R26.89);Muscle weakness (generalized) (M62.81);Difficulty in walking, not elsewhere classified (R26.2);Dizziness and giddiness (R42);Pain Pain - Right/Left: Right Pain - part of body: Knee     Time: 1610-9604 PT Time Calculation (min) (ACUTE ONLY): 34 min  Charges:  $Gait Training: 8-22  mins $Therapeutic Exercise: 8-22 mins                     Joycelyn Rua, PTA Acute Rehabilitation Services Pager [phone] Office [phone]     Adrienne Rogers 03/16/2018, 11:29 AM

## 2018-03-16 NOTE — Progress Notes (Signed)
Patient will DC to: [facility] Anticipated DC date: 03/16/18 Family notified: Patient declined (she has her cell phone) [facility] by: [facility] (next available but they are behind)   Per MD patient ready for DC to [facility]. RN, patient, patient's family, and facility notified of DC. Discharge Summary sent to facility. RN given number for report [phone]). DC packet on chart. Ambulance transport requested for patient.   CSW signing off.  Cristobal GoldmannNadia Dejan Angert, LCSW Clinical Social Worker [phone]

## 2018-03-16 NOTE — Plan of Care (Signed)

## 2018-03-16 NOTE — Progress Notes (Signed)
Pasrr #:  [id number] [id number]

## 2018-03-16 NOTE — Discharge Summary (Signed)
Patient ID: Adrienne Rogers MRN: [id number] DOB/AGE: Dec 21, 1940 77 y.o.  Admit date: 03/14/2018 Discharge date: 03/16/2018  Admission Diagnoses:  Principal Problem:   Primary osteoarthritis of right knee   Discharge Diagnoses:  Same  Past Medical History:  Diagnosis Date  . Asthma   . COPD (chronic obstructive pulmonary disease) (HCC)   . Depression   . Hyperlipidemia   . Hypertension   . PTSD (post-traumatic stress disorder)     Surgeries: Procedure(s): TOTAL KNEE ARTHROPLASTY on 03/14/2018   Consultants:   Discharged Condition: Improved  Hospital Course: Adrienne Rogers is an 77 y.o. female who was admitted 03/14/2018 for operative treatment ofPrimary osteoarthritis of right knee. Patient has severe unremitting pain that affects sleep, daily activities, and work/hobbies. After pre-op clearance the patient was taken to the operating room on 03/14/2018 and underwent  Procedure(s): TOTAL KNEE ARTHROPLASTY.    Patient was given perioperative antibiotics:  Anti-infectives (From admission, onward)   Start     Dose/Rate Route Frequency Ordered Stop   03/14/18 2000  ceFAZolin (ANCEF) IVPB 2g/100 mL premix     2 g 200 mL/hr over 30 Minutes Intravenous Every 6 hours 03/14/18 1803 03/15/18 0230   03/14/18 1100  ceFAZolin (ANCEF) IVPB 2g/100 mL premix     2 g 200 mL/hr over 30 Minutes Intravenous On call to O.R. 03/14/18 1000 03/14/18 1138   03/14/18 1003  ceFAZolin (ANCEF) 2-4 GM/100ML-% IVPB    Note to Pharmacy:  Shireen Quan   : cabinet override      03/14/18 1003 03/14/18 1138       Patient was given sequential compression devices, early ambulation, and chemoprophylaxis to prevent DVT.  Patient benefited maximally from hospital stay and there were no complications.    Recent vital signs:  Patient Vitals for the past 24 hrs:  BP Temp Temp src Pulse Resp SpO2  03/16/18 0925 - - - 65 16 96 %  03/16/18 0416 (!) 128/57 97.9 F (36.6 C) Oral 66 16 91 %  03/15/18 2049  (!) 153/74 98.1 F (36.7 C) Oral 61 16 97 %  03/15/18 1614 125/63 98 F (36.7 C) Oral (!) 59 - 99 %  03/15/18 1406 133/62 98.4 F (36.9 C) Oral 62 - 99 %     Recent laboratory studies: No results for input(s): WBC, HGB, HCT, PLT, NA, K, CL, CO2, BUN, CREATININE, GLUCOSE, INR, CALCIUM in the last 72 hours.  Invalid input(s): PT, 2   Discharge Medications:   Allergies as of 03/16/2018      Reactions   Lisinopril Tinitus   Ezetimibe-simvastatin Other (See Comments)   Muscle Pain   Fosamax  [alendronate Sodium] Other (See Comments)   Heartburn   Sulfa Antibiotics Itching, Rash   Eggs Or Egg-derived Products Nausea And Vomiting      Medication List    TAKE these medications   acetaminophen 500 MG tablet Commonly known as:  TYLENOL Take 1,000 mg by mouth 2 (two) times daily as needed for moderate pain.   albuterol 108 (90 Base) MCG/ACT inhaler Commonly known as:  PROVENTIL HFA;VENTOLIN HFA Inhale 2 puffs into the lungs every 6 (six) hours as needed for wheezing or shortness of breath.   amLODipine 10 MG tablet Commonly known as:  NORVASC Take 10 mg by mouth daily.   aspirin 325 MG EC tablet Take 1 tablet (325 mg total) by mouth 2 (two) times daily after a meal. What changed:    medication strength  how much to take  when to take this   budesonide-formoterol 160-4.5 MCG/ACT inhaler Commonly known as:  SYMBICORT Inhale 2 puffs into the lungs 2 (two) times daily as needed (shortness of breath).   buPROPion 150 MG 12 hr tablet Commonly known as:  WELLBUTRIN SR Take 150 mg by mouth daily.   diclofenac sodium 1 % Gel Commonly known as:  VOLTAREN Apply 2-4 g topically 4 (four) times daily as needed (knee pain).   docusate calcium 240 MG capsule Commonly known as:  SURFAK Take 240 mg by mouth daily as needed for mild constipation.   FLUoxetine 20 MG capsule Commonly known as:  PROZAC Take 60 mg by mouth daily.   furosemide 20 MG tablet Commonly known as:   LASIX Take 20 mg by mouth daily.   HYDROcodone-acetaminophen 5-325 MG tablet Commonly known as:  NORCO/VICODIN Take 1-2 tablets by mouth every 4 (four) hours as needed for moderate pain (pain score 4-6).   ketotifen 0.025 % ophthalmic solution Commonly known as:  ZADITOR Place 1 drop into both eyes as needed (allergies).   losartan 100 MG tablet Commonly known as:  COZAAR Take 100 mg by mouth daily.   multivitamin with minerals Tabs tablet Take 1 tablet by mouth daily.   omeprazole 40 MG capsule Commonly known as:  PRILOSEC Take 40 mg by mouth daily as needed (acid reflux).   simvastatin 40 MG tablet Commonly known as:  ZOCOR Take 40 mg by mouth at bedtime.   sodium chloride 0.65 % Soln nasal spray Commonly known as:  OCEAN Place 1 spray into both nostrils as needed for congestion.   tiZANidine 4 MG tablet Commonly known as:  ZANAFLEX Take 1 tablet (4 mg total) by mouth every 6 (six) hours as needed.   traZODone 50 MG tablet Commonly known as:  DESYREL Take 50 mg by mouth at bedtime.            Durable Medical Equipment  (From admission, onward)         Start     Ordered   03/14/18 1804  DME Walker rolling  Once    Question:  Patient needs a walker to treat with the following condition  Answer:  Primary osteoarthritis of right knee   03/14/18 1803   03/14/18 1804  DME 3 n 1  Once     03/14/18 1803   03/14/18 1804  DME Bedside commode  Once    Question:  Patient needs a bedside commode to treat with the following condition  Answer:  Primary osteoarthritis of right knee   03/14/18 1803          Diagnostic Studies: No results found.  Disposition: Discharge disposition: 03-Skilled Nursing Facility       Discharge Instructions    Call MD / Call 911   Complete by:  As directed    If you experience chest pain or shortness of breath, CALL 911 and be transported to the hospital emergency room.  If you develope a fever above 101 F, pus (white drainage)  or increased drainage or redness at the wound, or calf pain, call your surgeon's office.   Constipation Prevention   Complete by:  As directed    Drink plenty of fluids.  Prune juice may be helpful.  You may use a stool softener, such as Colace (over the counter) 100 mg twice a day.  Use MiraLax (over the counter) for constipation as needed.   Diet - low sodium heart healthy   Complete by:  As directed    Discharge instructions   Complete by:  As directed    INSTRUCTIONS AFTER JOINT REPLACEMENT   Remove items at home which could result in a fall. This includes throw rugs or furniture in walking pathways ICE to the affected joint every three hours while awake for 30 minutes at a time, for at least the first 3-5 days, and then as needed for pain and swelling.  Continue to use ice for pain and swelling. You may notice swelling that will progress down to the foot and ankle.  This is normal after surgery.  Elevate your leg when you are not up walking on it.   Continue to use the breathing machine you got in the hospital (incentive spirometer) which will help keep your temperature down.  It is common for your temperature to cycle up and down following surgery, especially at night when you are not up moving around and exerting yourself.  The breathing machine keeps your lungs expanded and your temperature down.   DIET:  As you were doing prior to hospitalization, we recommend a well-balanced diet.  DRESSING / WOUND CARE / SHOWERING  You may shower 3 days after surgery, but keep the wounds dry during showering.  You may use an occlusive plastic wrap (Press'n Seal for example), NO SOAKING/SUBMERGING IN THE BATHTUB.  If the bandage gets wet, change with a clean dry gauze.  If the incision gets wet, pat the wound dry with a clean towel.  ACTIVITY  Increase activity slowly as tolerated, but follow the weight bearing instructions below.   No driving for 6 weeks or until further direction given by your  physician.  You cannot drive while taking narcotics.  No lifting or carrying greater than 10 lbs. until further directed by your surgeon. Avoid periods of inactivity such as sitting longer than an hour when not asleep. This helps prevent blood clots.  You may return to work once you are authorized by your doctor.     WEIGHT BEARING   Weight bearing as tolerated with assist device (walker, cane, etc) as directed, use it as long as suggested by your surgeon or therapist, typically at least 4-6 weeks.   EXERCISES  Results after joint replacement surgery are often greatly improved when you follow the exercise, range of motion and muscle strengthening exercises prescribed by your doctor. Safety measures are also important to protect the joint from further injury. Any time any of these exercises cause you to have increased pain or swelling, decrease what you are doing until you are comfortable again and then slowly increase them. If you have problems or questions, call your caregiver or physical therapist for advice.   Rehabilitation is important following a joint replacement. After just a few days of immobilization, the muscles of the leg can become weakened and shrink (atrophy).  These exercises are designed to build up the tone and strength of the thigh and leg muscles and to improve motion. Often times heat used for twenty to thirty minutes before working out will loosen up your tissues and help with improving the range of motion but do not use heat for the first two weeks following surgery (sometimes heat can increase post-operative swelling).   These exercises can be done on a training (exercise) mat, on the floor, on a table or on a bed. Use whatever works the best and is most comfortable for you.    Use music or television while you are exercising so that the exercises  are a pleasant break in your day. This will make your life better with the exercises acting as a break in your routine that you  can look forward to.   Perform all exercises about fifteen times, three times per day or as directed.  You should exercise both the operative leg and the other leg as well.   Exercises include:   Quad Sets - Tighten up the muscle on the front of the thigh (Quad) and hold for 5-10 seconds.   Straight Leg Raises - With your knee straight (if you were given a brace, keep it on), lift the leg to 60 degrees, hold for 3 seconds, and slowly lower the leg.  Perform this exercise against resistance later as your leg gets stronger.  Leg Slides: Lying on your back, slowly slide your foot toward your buttocks, bending your knee up off the floor (only go as far as is comfortable). Then slowly slide your foot back down until your leg is flat on the floor again.  Angel Wings: Lying on your back spread your legs to the side as far apart as you can without causing discomfort.  Hamstring Strength:  Lying on your back, push your heel against the floor with your leg straight by tightening up the muscles of your buttocks.  Repeat, but this time bend your knee to a comfortable angle, and push your heel against the floor.  You may put a pillow under the heel to make it more comfortable if necessary.   A rehabilitation program following joint replacement surgery can speed recovery and prevent re-injury in the future due to weakened muscles. Contact your doctor or a physical therapist for more information on knee rehabilitation.    CONSTIPATION  Constipation is defined medically as fewer than three stools per week and severe constipation as less than one stool per week.  Even if you have a regular bowel pattern at home, your normal regimen is likely to be disrupted due to multiple reasons following surgery.  Combination of anesthesia, postoperative narcotics, change in appetite and fluid intake all can affect your bowels.   YOU MUST use at least one of the following options; they are listed in order of increasing strength  to get the job done.  They are all available over the counter, and you may need to use some, POSSIBLY even all of these options:    Drink plenty of fluids (prune juice may be helpful) and high fiber foods Colace 100 mg by mouth twice a day  Senokot for constipation as directed and as needed Dulcolax (bisacodyl), take with full glass of water  Miralax (polyethylene glycol) once or twice a day as needed.  If you have tried all these things and are unable to have a bowel movement in the first 3-4 days after surgery call either your surgeon or your primary doctor.    If you experience loose stools or diarrhea, hold the medications until you stool forms back up.  If your symptoms do not get better within 1 week or if they get worse, check with your doctor.  If you experience "the worst abdominal pain ever" or develop nausea or vomiting, please contact the office immediately for further recommendations for treatment.   ITCHING:  If you experience itching with your medications, try taking only a single pain pill, or even half a pain pill at a time.  You can also use Benadryl over the counter for itching or also to help with sleep.   TED  HOSE STOCKINGS:  Use stockings on both legs until for at least 2 weeks or as directed by physician office. They may be removed at night for sleeping.  MEDICATIONS:  See your medication summary on the "After Visit Summary" that nursing will review with you.  You may have some home medications which will be placed on hold until you complete the course of blood thinner medication.  It is important for you to complete the blood thinner medication as prescribed.  PRECAUTIONS:  If you experience chest pain or shortness of breath - call 911 immediately for transfer to the hospital emergency department.   If you develop a fever greater that 101 F, purulent drainage from wound, increased redness or drainage from wound, foul odor from the wound/dressing, or calf pain - CONTACT  YOUR SURGEON.                                                   FOLLOW-UP APPOINTMENTS:  If you do not already have a post-op appointment, please call the office for an appointment to be seen by your surgeon.  Guidelines for how soon to be seen are listed in your "After Visit Summary", but are typically between 1-4 weeks after surgery.  OTHER INSTRUCTIONS:   Knee Replacement:  Do not place pillow under knee, focus on keeping the knee straight while resting. CPM instructions: 0-90 degrees, 2 hours in the morning, 2 hours in the afternoon, and 2 hours in the evening. Place foam block, curve side up under heel at all times except when in CPM or when walking.  DO NOT modify, tear, cut, or change the foam block in any way.  MAKE SURE YOU:  Understand these instructions.  Get help right away if you are not doing well or get worse.    Thank you for letting us be a part of your medical care team.  It is a privilege we respect greatly.  We hope these instructions will help you stay on track for a fast and full recovery!   Increase activity slowly as tolerated   Complete by:  As directed       Follow-up Information    Marcene Corning, MD. Schedule an appointment as soon as possible for a visit in 2 weeks.   Specialty:  Orthopedic Surgery Contact information: [address] [place] [address] [phone]            Signed: Drema Halon 03/16/2018, 1:37 PM

## 2021-11-08 ENCOUNTER — Other Ambulatory Visit: Payer: Self-pay

## 2021-11-08 ENCOUNTER — Encounter (HOSPITAL_COMMUNITY): Payer: Self-pay

## 2021-11-08 ENCOUNTER — Inpatient Hospital Stay (HOSPITAL_COMMUNITY)
Admission: EM | Admit: 2021-11-08 | Discharge: 2021-11-10 | DRG: 379 | Disposition: A | Discharge status: Home / Self Care | Payer: Medicare Other | Attending: Internal Medicine | Admitting: Internal Medicine

## 2021-11-08 DIAGNOSIS — N1832 Chronic kidney disease, stage 3b: Secondary | ICD-10-CM | POA: Diagnosis present

## 2021-11-08 DIAGNOSIS — J449 Chronic obstructive pulmonary disease, unspecified: Secondary | ICD-10-CM | POA: Diagnosis present

## 2021-11-08 DIAGNOSIS — Z7982 Long term (current) use of aspirin: Secondary | ICD-10-CM

## 2021-11-08 DIAGNOSIS — F32A Depression, unspecified: Secondary | ICD-10-CM | POA: Diagnosis present

## 2021-11-08 DIAGNOSIS — K254 Chronic or unspecified gastric ulcer with hemorrhage: Principal | ICD-10-CM | POA: Diagnosis present

## 2021-11-08 DIAGNOSIS — Z7951 Long term (current) use of inhaled steroids: Secondary | ICD-10-CM

## 2021-11-08 DIAGNOSIS — I129 Hypertensive chronic kidney disease with stage 1 through stage 4 chronic kidney disease, or unspecified chronic kidney disease: Secondary | ICD-10-CM | POA: Diagnosis present

## 2021-11-08 DIAGNOSIS — Z96651 Presence of right artificial knee joint: Secondary | ICD-10-CM | POA: Diagnosis present

## 2021-11-08 DIAGNOSIS — R5381 Other malaise: Secondary | ICD-10-CM | POA: Diagnosis present

## 2021-11-08 DIAGNOSIS — Z881 Allergy status to other antibiotic agents status: Secondary | ICD-10-CM

## 2021-11-08 DIAGNOSIS — Z882 Allergy status to sulfonamides status: Secondary | ICD-10-CM

## 2021-11-08 DIAGNOSIS — K3189 Other diseases of stomach and duodenum: Secondary | ICD-10-CM | POA: Diagnosis present

## 2021-11-08 DIAGNOSIS — D500 Iron deficiency anemia secondary to blood loss (chronic): Secondary | ICD-10-CM | POA: Diagnosis present

## 2021-11-08 DIAGNOSIS — D631 Anemia in chronic kidney disease: Secondary | ICD-10-CM | POA: Diagnosis present

## 2021-11-08 DIAGNOSIS — Z888 Allergy status to other drugs, medicaments and biological substances status: Secondary | ICD-10-CM

## 2021-11-08 DIAGNOSIS — K922 Gastrointestinal hemorrhage, unspecified: Principal | ICD-10-CM | POA: Diagnosis present

## 2021-11-08 DIAGNOSIS — Z800 Family history of malignant neoplasm of digestive organs: Secondary | ICD-10-CM

## 2021-11-08 DIAGNOSIS — Z79899 Other long term (current) drug therapy: Secondary | ICD-10-CM

## 2021-11-08 DIAGNOSIS — Z87891 Personal history of nicotine dependence: Secondary | ICD-10-CM

## 2021-11-08 DIAGNOSIS — Z91012 Allergy to eggs: Secondary | ICD-10-CM

## 2021-11-08 DIAGNOSIS — F4310 Post-traumatic stress disorder, unspecified: Secondary | ICD-10-CM | POA: Diagnosis present

## 2021-11-08 DIAGNOSIS — E785 Hyperlipidemia, unspecified: Secondary | ICD-10-CM | POA: Diagnosis present

## 2021-11-08 DIAGNOSIS — R42 Dizziness and giddiness: Secondary | ICD-10-CM | POA: Diagnosis present

## 2021-11-08 LAB — CBC WITH DIFFERENTIAL/PLATELET
Abs Immature Granulocytes: 0.01 10*3/uL (ref 0.00–0.07)
Basophils Absolute: 0.1 10*3/uL (ref 0.0–0.1)
Basophils Relative: 1 %
Eosinophils Absolute: 0.1 10*3/uL (ref 0.0–0.5)
Eosinophils Relative: 2 %
HCT: 29.3 % — ABNORMAL LOW (ref 36.0–46.0)
Hemoglobin: 9.7 g/dL — ABNORMAL LOW (ref 12.0–15.0)
Immature Granulocytes: 0 %
Lymphocytes Relative: 44 %
Lymphs Abs: 2.5 10*3/uL (ref 0.7–4.0)
MCH: 30.6 pg (ref 26.0–34.0)
MCHC: 33.1 g/dL (ref 30.0–36.0)
MCV: 92.4 fL (ref 80.0–100.0)
Monocytes Absolute: 0.4 10*3/uL (ref 0.1–1.0)
Monocytes Relative: 7 %
Neutro Abs: 2.5 10*3/uL (ref 1.7–7.7)
Neutrophils Relative %: 46 %
Platelets: 243 10*3/uL (ref 150–400)
RBC: 3.17 MIL/uL — ABNORMAL LOW (ref 3.87–5.11)
RDW: 13.7 % (ref 11.5–15.5)
WBC: 5.6 10*3/uL (ref 4.0–10.5)
nRBC: 0 % (ref 0.0–0.2)

## 2021-11-08 LAB — COMPREHENSIVE METABOLIC PANEL
ALT: 19 U/L (ref 0–44)
AST: 19 U/L (ref 15–41)
Albumin: 4.2 g/dL (ref 3.5–5.0)
Alkaline Phosphatase: 84 U/L (ref 38–126)
Anion gap: 6 (ref 5–15)
BUN: 31 mg/dL — ABNORMAL HIGH (ref 8–23)
CO2: 25 mmol/L (ref 22–32)
Calcium: 9.6 mg/dL (ref 8.9–10.3)
Chloride: 111 mmol/L (ref 98–111)
Creatinine, Ser: 1.33 mg/dL — ABNORMAL HIGH (ref 0.44–1.00)
GFR, Estimated: 41 mL/min — ABNORMAL LOW (ref >60)
Glucose, Bld: 97 mg/dL (ref 70–99)
Potassium: 4.3 mmol/L (ref 3.5–5.1)
Sodium: 142 mmol/L (ref 135–145)
Total Bilirubin: 0.4 mg/dL (ref 0.3–1.2)
Total Protein: 7.1 g/dL (ref 6.5–8.1)

## 2021-11-08 LAB — LIPASE, BLOOD: Lipase: 24 U/L (ref 11–51)

## 2021-11-08 LAB — TYPE AND SCREEN
ABO/RH(D): A POS
Antibody Screen: NEGATIVE

## 2021-11-08 LAB — POC OCCULT BLOOD, ED: Fecal Occult Bld: POSITIVE — AB

## 2021-11-08 MED ORDER — SODIUM CHLORIDE 0.9 % IV BOLUS
500.0000 mL | Freq: Once | INTRAVENOUS | Status: AC
  Administered 2021-11-08: 500 mL via INTRAVENOUS

## 2021-11-08 MED ORDER — AMLODIPINE BESYLATE 5 MG PO TABS: 5.0000 mg | ORAL_TABLET | Freq: Every day | ORAL | Status: DC

## 2021-11-08 MED ORDER — LACTATED RINGERS IV SOLN: INTRAVENOUS | Status: AC

## 2021-11-08 MED ORDER — FLUOXETINE HCL 20 MG PO CAPS: 60.0000 mg | ORAL_CAPSULE | Freq: Every day | ORAL | Status: DC

## 2021-11-08 MED ORDER — PANTOPRAZOLE SODIUM 40 MG IV SOLR
40.0000 mg | Freq: Two times a day (BID) | INTRAVENOUS | Status: DC
  Administered 2021-11-08 – 2021-11-10 (×4): 40 mg via INTRAVENOUS
  Filled 2021-11-08 (×4): qty 10

## 2021-11-08 MED ORDER — CARVEDILOL 3.125 MG PO TABS
3.1250 mg | ORAL_TABLET | Freq: Two times a day (BID) | ORAL | Status: DC
  Filled 2021-11-08 (×3): qty 1

## 2021-11-08 MED ORDER — MOMETASONE FURO-FORMOTEROL FUM 200-5 MCG/ACT IN AERO
2.0000 | INHALATION_SPRAY | Freq: Two times a day (BID) | RESPIRATORY_TRACT | Status: DC
  Administered 2021-11-09 – 2021-11-10 (×2): 2 via RESPIRATORY_TRACT
  Filled 2021-11-08: qty 8.8

## 2021-11-08 MED ORDER — SERTRALINE HCL 25 MG PO TABS: 25.0000 mg | ORAL_TABLET | Freq: Every day | ORAL | Status: DC

## 2021-11-08 MED ORDER — TRAZODONE HCL 50 MG PO TABS
50.0000 mg | ORAL_TABLET | Freq: Every day | ORAL | Status: DC
  Administered 2021-11-09: 50 mg via ORAL
  Filled 2021-11-08: qty 1

## 2021-11-08 MED ORDER — ADULT MULTIVITAMIN W/MINERALS CH
1.0000 | ORAL_TABLET | Freq: Every day | ORAL | Status: DC
  Administered 2021-11-09 – 2021-11-10 (×2): 1 via ORAL
  Filled 2021-11-08 (×2): qty 1

## 2021-11-08 MED ORDER — PANTOPRAZOLE SODIUM 40 MG IV SOLR: 40.0000 mg | Freq: Once | INTRAVENOUS | Status: DC

## 2021-11-08 MED ORDER — SIMVASTATIN 40 MG PO TABS
40.0000 mg | ORAL_TABLET | Freq: Every day | ORAL | Status: DC
  Administered 2021-11-09 (×2): 40 mg via ORAL
  Filled 2021-11-08 (×2): qty 1

## 2021-11-08 NOTE — ED Triage Notes (Addendum)
Patient said last night she had dark tarry loose/pudding like with some bright red blood when she wiped. She said same thing happened this morning. Seen at the doctor on 5/3 and said her hemoglobin was around 11. ?

## 2021-11-08 NOTE — ED Provider Triage Note (Signed)
Emergency Medicine Provider Triage Evaluation Note ? ?Adrienne Rogers , a 81 y.o. female  was evaluated in triage.  Pt complains of abdominal pain and rectal bleeding.  States that she has been having loose stools for the past 2 days.  Reports 2 episodes of dark stools as well as blood when wiping.  She denies a history of diverticulosis.  Denies any recent bismuth use.  Denies being anticoagulated.  Reports additional mild abdominal pain as well as lightheadedness.  No chest pain or shortness of breath. ? ?Physical Exam  ?BP 131/74 (BP Location: Left Arm)   Pulse 63   Resp 20   SpO2 100%  ?Gen:   Awake, no distress   ?Resp:  Normal effort  ?MSK:   Moves extremities without difficulty  ?Other:   ? ?Medical Decision Making  ?Medically screening exam initiated at 7:13 PM.  Appropriate orders placed.  Adrienne Rogers was informed that the remainder of the evaluation will be completed by another provider, this initial triage assessment does not replace that evaluation, and the importance of remaining in the ED until their evaluation is complete. ?  ?Placido Sou, PA-C ?11/08/21 1914 ? ?

## 2021-11-08 NOTE — H&P (Signed)
?History and Physical ? ?Adrienne Rogers RDE:[id number] DOB: 04/24/1941 DOA: 11/08/2021 ? ?Referring physician: Dr. Estell Harpin, [facility] EDP ?PCP: Bedelia Person, MD  ?Outpatient Specialists: GI, Dr. Loreta Ave ?Patient coming from: Home. ? ?Chief Complaint: Dark stools since yesterday. ? ?HPI: Adrienne Rogers is a 81 y.o. 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  Had another episode of dark stools today which prompted her to come to the ED.  Associated with intermittent mild dizziness.  Nausea with no vomiting.  Mild left lower quadrant abdominal pain.  No chest pain, palpitations, or dizziness.  She presented to the ED for further evaluation.  In the ED, rectal exam by EDP showed dark stools with heme positive.  CBC revealed hemoglobin of 9.7 K from 11.3, on 11/04/21.  The patient is on home baby aspirin daily.  No use of NSAIDs orally.  She follows with Dr. Loreta Ave outpatient.  EDP called for admission.  The patient was admitted by the Hospitalist service, [facility]. ? ?ED Course: Tmax 98.3.  BP 169/76, pulse 65, respiration rate 17 O2 saturation 100% on room air.  Lab studies remarkable for WBC 5.6, hemoglobin 9.7, platelet count 243.  Sodium 142, serum bicarb 25, BUN 31, creatinine 1.33 with GFR 41, at baseline.  FOBT positive. ? ?Review of Systems: ?Review of systems as noted in the HPI. All other systems reviewed and are negative. ? ? ?Past Medical History:  ?Diagnosis Date  ? Asthma   ? COPD (chronic obstructive pulmonary disease) (HCC)   ? Depression   ? Hyperlipidemia   ? Hypertension   ? PTSD (post-traumatic stress disorder)   ? ?Past Surgical History:  ?Procedure Laterality Date  ? ABDOMINAL HYSTERECTOMY    ? 1981  ? BACK  SURGERY    ? ACDF 2005  ? CHOLECYSTECTOMY    ? right knee arthroscopy    ? 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 Procedure: TOTAL KNEE ARTHROPLASTY;  Surgeon: Marcene Corning, MD;  Location: [facility] OR;  Service: Orthopedics;  Laterality: Right;  ? ? ?Social History:  reports that she has never smoked. She has never used smokeless tobacco. She reports that she does not currently use alcohol. She reports that she does not currently use drugs. ? ? ?Allergies  ?Allergen Reactions  ? Lisinopril Tinitus  ? Ezetimibe-Simvastatin Other (See Comments)  ?  Muscle Pain  ? Fosamax  [Alendronate Sodium] Other (See Comments)  ?  Heartburn  ? Sulfa Antibiotics Itching and Rash  ? Eggs Or Egg-Derived Products Nausea And Vomiting  ? ? ?Family history: ?Mother with history of metastatic colon cancer, deceased at the age of 62. ?Father with history of stroke, deceased at the age of 80. ? ? ?Prior to Admission medications   ?Medication Sig Start Date End Date Taking? Authorizing Provider  ?acetaminophen (TYLENOL) 500 MG tablet Take 1,000 mg by mouth 2 (two) times daily as needed for moderate pain.    [provider]  ?albuterol (PROVENTIL HFA;VENTOLIN HFA) 108 (90 Base) MCG/ACT inhaler Inhale 2 puffs into the lungs every 6 (six) hours as needed for wheezing or shortness of breath.     [provider]  ?amLODipine (NORVASC) 10 MG tablet Take  10 mg by mouth daily.    [provider]  ?aspirin EC 325 MG EC tablet Take 1 tablet (325 mg total) by mouth 2 (two) times daily after a meal. 03/15/18   Elodia FlorenceNida, Andrew, PA-C  ?budesonide-formoterol (SYMBICORT) 160-4.5 MCG/ACT inhaler Inhale 2 puffs into the lungs 2 (two) times daily as needed (shortness of breath).     [provider]  ?buPROPion (WELLBUTRIN SR) 150 MG 12 hr tablet Take 150 mg by mouth daily. 05/07/13   [provider]  ?diclofenac sodium (VOLTAREN) 1 % GEL Apply 2-4 g topically 4 (four) times  daily as needed (knee pain).  02/24/18   [provider]  ?docusate calcium (SURFAK) 240 MG capsule Take 240 mg by mouth daily as needed for mild constipation.     [provider]  ?FLUoxetine (PROZAC) 20 MG capsule Take 60 mg by mouth daily.     [provider]  ?furosemide (LASIX) 20 MG tablet Take 20 mg by mouth daily.    [provider]  ?HYDROcodone-acetaminophen (NORCO/VICODIN) 5-325 MG tablet Take 1-2 tablets by mouth every 4 (four) hours as needed for moderate pain (pain score 4-6). 03/15/18   Elodia FlorenceNida, Andrew, PA-C  ?ketotifen (ZADITOR) 0.025 % ophthalmic solution Place 1 drop into both eyes as needed (allergies).     [provider]  ?losartan (COZAAR) 100 MG tablet Take 100 mg by mouth daily.    [provider]  ?Multiple Vitamin (MULTIVITAMIN WITH MINERALS) TABS tablet Take 1 tablet by mouth daily.    [provider]  ?omeprazole (PRILOSEC) 40 MG capsule Take 40 mg by mouth daily as needed (acid reflux).    [provider]  ?simvastatin (ZOCOR) 40 MG tablet Take 40 mg by mouth at bedtime.     [provider]  ?sodium chloride (OCEAN) 0.65 % SOLN nasal spray Place 1 spray into both nostrils as needed for congestion.    [provider]  ?traZODone (DESYREL) 50 MG tablet Take 50 mg by mouth at bedtime.    [provider]  ? ? ?Physical Exam: ?BP (!) 169/76   Pulse 65   Temp 98.3 ?F (36.8 ?C) (Oral)   Resp 17   SpO2 100%  ? ?General: 81 y.o. year-old female well developed well nourished in no acute distress.  Alert and oriented x3. ?Cardiovascular: Regular rate and rhythm with no rubs or gallops.  No thyromegaly or JVD noted.  No lower extremity edema. 2/4 pulses in all 4 extremities. ?Respiratory: Clear to auscultation with no wheezes or rales. Good inspiratory effort. ?Abdomen: Soft mild to moderate tenderness at LLQ, non-distended with normal bowel sounds x4 quadrants. ?Muskuloskeletal: No cyanosis, clubbing  or edema noted bilaterally ?Neuro: CN II-XII intact, strength, sensation, reflexes ?Skin: No ulcerative lesions noted or rashes ?Psychiatry: Judgement and insight appear normal. Mood is appropriate for condition and setting ?   ?   ?   ?Labs on Admission:  ?Basic Metabolic Panel: ?Recent Labs  ?Lab 11/08/21 ?1934  ?NA 142  ?K 4.3  ?CL 111  ?CO2 25  ?GLUCOSE 97  ?BUN 31*  ?CREATININE 1.33*  ?CALCIUM 9.6  ? ?Liver Function Tests: ?Recent Labs  ?Lab 11/08/21 ?1934  ?AST 19  ?ALT 19  ?ALKPHOS 84  ?BILITOT 0.4  ?PROT 7.1  ?ALBUMIN 4.2  ? ?Recent Labs  ?Lab 11/08/21 ?1934  ?LIPASE 24  ? ?No results for input(s): AMMONIA in the last 168 hours. ?CBC: ?Recent Labs  ?Lab 11/08/21 ?1934  ?WBC 5.6  ?  NEUTROABS 2.5  ?HGB 9.7*  ?HCT 29.3*  ?MCV 92.4  ?PLT 243  ? ?Cardiac Enzymes: ?No results for input(s): CKTOTAL, CKMB, CKMBINDEX, TROPONINI in the last 168 hours. ? ?BNP (last 3 results) ?No results for input(s): BNP in the last 8760 hours. ? ?ProBNP (last 3 results) ?No results for input(s): PROBNP in the last 8760 hours. ? ?CBG: ?No results for input(s): GLUCAP in the last 168 hours. ? ?Radiological Exams on Admission: ?No results found. ? ?EKG: I independently viewed the EKG done and my findings are as followed: None available at the time of the visit. ? ?Assessment/Plan ?Present on Admission: ? GI bleed ? ?Principal Problem: ?  GI bleed ? ? ?Presumed upper GI bleed ?Presented with melena, noted yesterday and today. ?Hemoglobin 11.3 K on 11/04/21. ?Presented with hemoglobin of 9.7 K. ?Serial H&H every 6 hours x 4 ?Transfuse hemoglobin less than 7.0, the patient is agreeable to transfusion if needed. ?IV Protonix 40 mg twice daily ?N.p.o. after midnight ?Maintain MAP greater than 65. ?GI, Dr. Loreta Ave consulted via secure chat ? ?Hypertension, BP is elevated, not at goal ?Resume home oral antihypertensive at lower dose to avoid hypotension ?Coreg 3.125 mg twice daily. ?Hold off home spironolactone for now. ?Maintain MAP greater than  65 ?Continue to monitor vital signs. ? ?CKD 3B ?Appears to be at her baseline creatinine 1.3 with GFR 41 ?Avoid nephrotoxic agents, dehydration and hypotension. ?Start gentle IV fluid hydration LR at 50 cc/h x 1

## 2021-11-09 DIAGNOSIS — F4310 Post-traumatic stress disorder, unspecified: Secondary | ICD-10-CM | POA: Diagnosis present

## 2021-11-09 DIAGNOSIS — K254 Chronic or unspecified gastric ulcer with hemorrhage: Secondary | ICD-10-CM | POA: Diagnosis present

## 2021-11-09 DIAGNOSIS — Z87891 Personal history of nicotine dependence: Secondary | ICD-10-CM | POA: Diagnosis not present

## 2021-11-09 DIAGNOSIS — D62 Acute posthemorrhagic anemia: Secondary | ICD-10-CM | POA: Diagnosis not present

## 2021-11-09 DIAGNOSIS — F32A Depression, unspecified: Secondary | ICD-10-CM | POA: Diagnosis present

## 2021-11-09 DIAGNOSIS — Z881 Allergy status to other antibiotic agents status: Secondary | ICD-10-CM | POA: Diagnosis not present

## 2021-11-09 DIAGNOSIS — Z7982 Long term (current) use of aspirin: Secondary | ICD-10-CM | POA: Diagnosis not present

## 2021-11-09 DIAGNOSIS — D631 Anemia in chronic kidney disease: Secondary | ICD-10-CM | POA: Diagnosis present

## 2021-11-09 DIAGNOSIS — D500 Iron deficiency anemia secondary to blood loss (chronic): Secondary | ICD-10-CM

## 2021-11-09 DIAGNOSIS — Z91012 Allergy to eggs: Secondary | ICD-10-CM | POA: Diagnosis not present

## 2021-11-09 DIAGNOSIS — Z800 Family history of malignant neoplasm of digestive organs: Secondary | ICD-10-CM | POA: Diagnosis not present

## 2021-11-09 DIAGNOSIS — Z882 Allergy status to sulfonamides status: Secondary | ICD-10-CM | POA: Diagnosis not present

## 2021-11-09 DIAGNOSIS — I129 Hypertensive chronic kidney disease with stage 1 through stage 4 chronic kidney disease, or unspecified chronic kidney disease: Secondary | ICD-10-CM | POA: Diagnosis present

## 2021-11-09 DIAGNOSIS — Z79899 Other long term (current) drug therapy: Secondary | ICD-10-CM | POA: Diagnosis not present

## 2021-11-09 DIAGNOSIS — R5381 Other malaise: Secondary | ICD-10-CM | POA: Diagnosis present

## 2021-11-09 DIAGNOSIS — Z7951 Long term (current) use of inhaled steroids: Secondary | ICD-10-CM | POA: Diagnosis not present

## 2021-11-09 DIAGNOSIS — K3189 Other diseases of stomach and duodenum: Secondary | ICD-10-CM | POA: Diagnosis present

## 2021-11-09 DIAGNOSIS — Z96651 Presence of right artificial knee joint: Secondary | ICD-10-CM | POA: Diagnosis present

## 2021-11-09 DIAGNOSIS — K922 Gastrointestinal hemorrhage, unspecified: Secondary | ICD-10-CM | POA: Diagnosis present

## 2021-11-09 DIAGNOSIS — Z888 Allergy status to other drugs, medicaments and biological substances status: Secondary | ICD-10-CM | POA: Diagnosis not present

## 2021-11-09 DIAGNOSIS — E785 Hyperlipidemia, unspecified: Secondary | ICD-10-CM | POA: Diagnosis present

## 2021-11-09 DIAGNOSIS — J449 Chronic obstructive pulmonary disease, unspecified: Secondary | ICD-10-CM | POA: Diagnosis present

## 2021-11-09 DIAGNOSIS — R42 Dizziness and giddiness: Secondary | ICD-10-CM | POA: Diagnosis present

## 2021-11-09 DIAGNOSIS — N1832 Chronic kidney disease, stage 3b: Secondary | ICD-10-CM | POA: Diagnosis present

## 2021-11-09 LAB — CBC WITH DIFFERENTIAL/PLATELET
Abs Immature Granulocytes: 0.02 10*3/uL (ref 0.00–0.07)
Basophils Absolute: 0 10*3/uL (ref 0.0–0.1)
Basophils Relative: 1 %
Eosinophils Absolute: 0.1 10*3/uL (ref 0.0–0.5)
Eosinophils Relative: 3 %
HCT: 25.7 % — ABNORMAL LOW (ref 36.0–46.0)
Hemoglobin: 8.5 g/dL — ABNORMAL LOW (ref 12.0–15.0)
Immature Granulocytes: 0 %
Lymphocytes Relative: 45 %
Lymphs Abs: 2.3 10*3/uL (ref 0.7–4.0)
MCH: 30.6 pg (ref 26.0–34.0)
MCHC: 33.1 g/dL (ref 30.0–36.0)
MCV: 92.4 fL (ref 80.0–100.0)
Monocytes Absolute: 0.4 10*3/uL (ref 0.1–1.0)
Monocytes Relative: 9 %
Neutro Abs: 2.1 10*3/uL (ref 1.7–7.7)
Neutrophils Relative %: 42 %
Platelets: 209 10*3/uL (ref 150–400)
RBC: 2.78 MIL/uL — ABNORMAL LOW (ref 3.87–5.11)
RDW: 13.5 % (ref 11.5–15.5)
WBC: 5 10*3/uL (ref 4.0–10.5)
nRBC: 0 % (ref 0.0–0.2)

## 2021-11-09 LAB — COMPREHENSIVE METABOLIC PANEL
ALT: 16 U/L (ref 0–44)
AST: 18 U/L (ref 15–41)
Albumin: 3.6 g/dL (ref 3.5–5.0)
Alkaline Phosphatase: 73 U/L (ref 38–126)
Anion gap: 7 (ref 5–15)
BUN: 26 mg/dL — ABNORMAL HIGH (ref 8–23)
CO2: 21 mmol/L — ABNORMAL LOW (ref 22–32)
Calcium: 9 mg/dL (ref 8.9–10.3)
Chloride: 113 mmol/L — ABNORMAL HIGH (ref 98–111)
Creatinine, Ser: 1.1 mg/dL — ABNORMAL HIGH (ref 0.44–1.00)
GFR, Estimated: 51 mL/min — ABNORMAL LOW (ref >60)
Glucose, Bld: 91 mg/dL (ref 70–99)
Potassium: 3.9 mmol/L (ref 3.5–5.1)
Sodium: 141 mmol/L (ref 135–145)
Total Bilirubin: 0.5 mg/dL (ref 0.3–1.2)
Total Protein: 6.2 g/dL — ABNORMAL LOW (ref 6.5–8.1)

## 2021-11-09 LAB — HEMOGLOBIN AND HEMATOCRIT, BLOOD
HCT: 26.3 % — ABNORMAL LOW (ref 36.0–46.0)
HCT: 26.7 % — ABNORMAL LOW (ref 36.0–46.0)
HCT: 27.7 % — ABNORMAL LOW (ref 36.0–46.0)
Hemoglobin: 8.5 g/dL — ABNORMAL LOW (ref 12.0–15.0)
Hemoglobin: 8.6 g/dL — ABNORMAL LOW (ref 12.0–15.0)
Hemoglobin: 8.7 g/dL — ABNORMAL LOW (ref 12.0–15.0)

## 2021-11-09 LAB — PHOSPHORUS: Phosphorus: 2.9 mg/dL (ref 2.5–4.6)

## 2021-11-09 LAB — MAGNESIUM: Magnesium: 2.3 mg/dL (ref 1.7–2.4)

## 2021-11-09 MED ORDER — LOSARTAN POTASSIUM 50 MG PO TABS
50.0000 mg | ORAL_TABLET | Freq: Every day | ORAL | Status: DC
  Administered 2021-11-09 – 2021-11-10 (×2): 50 mg via ORAL
  Filled 2021-11-09 (×2): qty 1

## 2021-11-09 MED ORDER — SODIUM CHLORIDE 0.9 % IV SOLN: INTRAVENOUS | Status: DC

## 2021-11-09 MED ORDER — GABAPENTIN 300 MG PO CAPS
300.0000 mg | ORAL_CAPSULE | Freq: Every day | ORAL | Status: DC
  Administered 2021-11-09: 300 mg via ORAL
  Filled 2021-11-09: qty 1

## 2021-11-09 MED ORDER — ONDANSETRON HCL 4 MG/2ML IJ SOLN
4.0000 mg | Freq: Three times a day (TID) | INTRAMUSCULAR | Status: DC | PRN
  Administered 2021-11-09: 4 mg via INTRAVENOUS
  Filled 2021-11-09: qty 2

## 2021-11-09 MED ORDER — SPIRONOLACTONE 25 MG PO TABS
25.0000 mg | ORAL_TABLET | Freq: Every day | ORAL | Status: DC
  Administered 2021-11-09 – 2021-11-10 (×2): 25 mg via ORAL
  Filled 2021-11-09 (×2): qty 1

## 2021-11-09 MED ORDER — ONDANSETRON HCL 4 MG/2ML IJ SOLN: 4.0000 mg | Freq: Three times a day (TID) | INTRAMUSCULAR | Status: DC

## 2021-11-09 MED ORDER — SERTRALINE HCL 50 MG PO TABS
50.0000 mg | ORAL_TABLET | Freq: Every day | ORAL | Status: DC
  Administered 2021-11-09 – 2021-11-10 (×2): 50 mg via ORAL
  Filled 2021-11-09 (×2): qty 1

## 2021-11-09 MED ORDER — ACETAMINOPHEN 325 MG PO TABS
650.0000 mg | ORAL_TABLET | Freq: Once | ORAL | Status: AC
Start: 2021-11-09 — End: 2021-11-09
  Administered 2021-11-09: 650 mg via ORAL
  Filled 2021-11-09: qty 2

## 2021-11-09 NOTE — Evaluation (Signed)
Occupational Therapy Evaluation ?Patient Details ?Name: Adrienne Rogers ?MRN: [id number] ?DOB: 04-Jul-1941 ?Today's Date: 11/09/2021 ? ? ?History of Present Illness Patient is a 81 year old female who presented to the hosptial with dark stools and blood on toilet paper after completing hygiene tasks.patient is suspected to have GI bleed. PMH: R TKA, COPD, PTSD, HTN, HLD, recent hx of vertigo (thought to be medication induced per pt)  ? ?Clinical Impression ?  ?Patient evaluated by Occupational Therapy with no further acute OT needs identified. All education has been completed and the patient has no further questions. Patient is MI for ADLs at this time. Patient reported being more fatigued with patient still NPO pending procedure at this time.  See below for any follow-up Occupational Therapy or equipment needs. OT is signing off. Thank you for this referral. ?  ?   ? ?Recommendations for follow up therapy are one component of a multi-disciplinary discharge planning process, led by the attending physician.  Recommendations may be updated based on patient status, additional functional criteria and insurance authorization.  ? ?Follow Up Recommendations ? No OT follow up  ?  ?Assistance Recommended at Discharge None  ?Patient can return home with the following Assistance with cooking/housework ? ?  ?Functional Status Assessment ? Patient has not had a recent decline in their functional status  ?Equipment Recommendations ? None recommended by OT  ?  ?Recommendations for Other Services   ? ? ?  ?Precautions / Restrictions Precautions ?Precautions: Fall ?Restrictions ?Weight Bearing Restrictions: No  ? ?  ? ?Mobility Bed Mobility ?  ?  ?  ?  ?  ?  ?  ?General bed mobility comments: up in bathroom at start of session ?  ? ?Transfers ?  ?  ?  ?  ?  ?  ?  ?  ?  ?  ?  ? ?  ?Balance Overall balance assessment: Mild deficits observed, not formally tested ?  ?  ?  ?  ?  ?  ?  ?  ?  ?  ?  ?  ?  ?  ?  ?  ?  ?  ?   ? ?ADL either  performed or assessed with clinical judgement  ? ?ADL Overall ADL's : Modified independent ?  ?  ?  ?  ?  ?  ?  ?  ?  ?  ?  ?  ?  ?  ?  ?  ?  ?  ?  ?General ADL Comments: patient completed toileting tasks with MI, LB dressing, UB bathing tasks during session. patient was found in bathroom completing toileting hygiene herself with no LOB noted. patient was noted to be more fatgiued but is currently NPO pendinug proceedure.  ? ? ? ?Vision Patient Visual Report: No change from baseline ?   ?   ?Perception   ?  ?Praxis   ?  ? ?Pertinent Vitals/Pain Pain Assessment ?Pain Assessment: No/denies pain  ? ? ? ?Hand Dominance   ?  ?Extremity/Trunk Assessment Upper Extremity Assessment ?Upper Extremity Assessment: Defer to OT evaluation ?  ?Lower Extremity Assessment ?Lower Extremity Assessment: Overall WFL for tasks assessed ?  ?Cervical / Trunk Assessment ?Cervical / Trunk Assessment: Normal ?  ?Communication Communication ?Communication: No difficulties ?  ?Cognition Arousal/Alertness: Awake/alert ?Behavior During Therapy: [facility] for tasks assessed/performed ?Overall Cognitive Status: Within Functional Limits for tasks assessed ?  ?  ?  ?  ?  ?  ?  ?  ?  ?  ?  ?  ?  ?  ?  ?  ?  ?  ?  ?  General Comments    ? ?  ?Exercises   ?  ?Shoulder Instructions    ? ? ?Home Living Family/patient expects to be discharged to:: Private residence ?Living Arrangements: Alone ?  ?Type of Home: House ?  ?  ?  ?Home Layout: Able to live on main level with bedroom/bathroom ?  ?  ?Bathroom Shower/Tub: Tub/shower unit ?  ?  ?  ?  ?Home Equipment: Rollator (4 wheels);Cane - single point ?  ?  ?  ? ?  ?Prior Functioning/Environment Prior Level of Function : Independent/Modified Independent ?  ?  ?  ?  ?  ?  ?  ?  ?  ? ?  ?  ?OT Problem List: Decreased activity tolerance;Decreased safety awareness;Impaired balance (sitting and/or standing) ?  ?   ?OT Treatment/Interventions:    ?  ?OT Goals(Current goals can be found in the care plan section) Acute Rehab  OT Goals ?OT Goal Formulation: All assessment and education complete, DC therapy  ?OT Frequency:   ?  ? ?Co-evaluation   ?  ?  ?  ?  ? ?  ?AM-PAC OT "6 Clicks" Daily Activity     ?Outcome Measure Help from another person eating meals?: None ?Help from another person taking care of personal grooming?: None ?Help from another person toileting, which includes using toliet, bedpan, or urinal?: A Little ?Help from another person bathing (including washing, rinsing, drying)?: A Little ?Help from another person to put on and taking off regular upper body clothing?: None ?Help from another person to put on and taking off regular lower body clothing?: A Little ?6 Click Score: 21 ?  ?End of Session   ? ?Activity Tolerance: Patient tolerated treatment well ?Patient left: in chair;with call bell/phone within reach ? ?OT Visit Diagnosis: Unsteadiness on feet (R26.81)  ?              ?Time: [id number] ?OT Time Calculation (min): 18 min ?Charges:  OT General Charges ?$OT Visit: 1 Visit ?OT Evaluation ?$OT Eval Low Complexity: 1 Low ? ?Adrienne Rogers OTR/L, MS ?Acute Rehabilitation Department ?Office# [phone] ?Pager# [phone] ? ? ?Adrienne Rogers Adrienne Rogers ?11/09/2021, 12:36 PM ?

## 2021-11-09 NOTE — Hospital Course (Signed)
81 y.o. 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  Had another episode of dark stools on the day of presentation which prompted her to come to the ED.  ?

## 2021-11-09 NOTE — Consult Note (Signed)
Reason for Consult: Melena with anemia. ?Reffering Physician: THP. ? ?Adrienne Rogers is an 81 y.o. female.  ?HPI: Adrienne Rogers is a 81 year old black female, with multiple medical problems listed below, includiing CKD Stage 3B, anemia of chronic disease, admitted with a history of melena for the last 2 days. Her hemoglobin has dropped from 9.7 gms/dl to 8.5 gms/dl. Her hemoglobin was 11.3 gms/dl on 11/04/21.  Patient gives a history of being weak and dizzy but denies any history of syncope or near syncopal events.  She gives a history of having a gastric ulcer over 20 years ago and has not taken any nonsteroidals since then. Her mother was diagnosed with colon cancer at the age of 39. She had a normal colonoscopy done in 2015 ? ?Past Medical History:  ?Diagnosis Date  ? Asthma   ? COPD (chronic obstructive pulmonary disease) ([place])   ? Depression   ? Hyperlipidemia   ? Hypertension   ? PTSD (post-traumatic stress disorder)   ? ?Past Surgical History:  ?Procedure Laterality Date  ? ABDOMINAL HYSTERECTOMY    ? 1981  ? BACK SURGERY    ? ACDF 2005  ? CHOLECYSTECTOMY    ? right knee arthroscopy    ? 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 Procedure: TOTAL KNEE ARTHROPLASTY;  Surgeon: Melrose Nakayama, MD;  Location: [place];  Service: Orthopedics;  Laterality: Right;  ? ?History reviewed. No pertinent family history. ? ?Social History:  reports that she has never smoked. She has never used smokeless tobacco. She reports that she does not currently use alcohol. She reports that she does not currently use drugs. ? ?Allergies:  ?Allergies  ?Allergen Reactions  ? Lisinopril Tinitus  ? Ezetimibe-Simvastatin Other (See Comments)  ?  Muscle Pain  ? Fosamax  [Alendronate Sodium] Other (See Comments)  ?  Heartburn  ? Sulfa Antibiotics Itching and Rash  ? Bupropion Other (See Comments)  ?  vertigo  ? Doxepin Swelling  ? Prazosin   ?  Other reaction(s): Low blood pressure  ?  Tetracyclines & Related Other (See Comments)  ?  Swelling eruption of the lips  ? Eggs Or Egg-Derived Products Nausea And Vomiting  ? ?Medications: I have reviewed the patient's current medications. ?Prior to Admission:  ?Medications Prior to Admission  ?Medication Sig Dispense Refill Last Dose  ? acetaminophen (TYLENOL) 500 MG tablet Take 1,000 mg by mouth 2 (two) times daily as needed for moderate pain.   Past Week  ? albuterol (PROVENTIL HFA;VENTOLIN HFA) 108 (90 Base) MCG/ACT inhaler Inhale 2 puffs into the lungs every 6 (six) hours as needed for wheezing or shortness of breath.    unk  ? aspirin EC 325 MG EC tablet Take 1 tablet (325 mg total) by mouth 2 (two) times daily after a meal. (Patient taking differently: Take 81 mg by mouth daily.) 30 tablet 0 11/08/2021  ? Calcium Carb-Cholecalciferol 600-10 MG-MCG TABS Take 1 tablet by mouth daily.   11/08/2021  ? carvedilol (COREG) 25 MG tablet Take 12.5 tablets by mouth 2 (two) times daily.   11/08/2021 at 1000  ? Cholecalciferol 25 MCG (1000 UT) tablet Take 1,000 Units by mouth daily.   11/08/2021  ? docusate calcium (SURFAK) 240 MG capsule Take 240 mg by mouth daily as needed for mild constipation.    unk  ? fluticasone (FLONASE) 50 MCG/ACT nasal spray Place 2 sprays into both nostrils daily as needed for allergies or rhinitis.  Past Week  ? gabapentin (NEURONTIN) 300 MG capsule Take 300 mg by mouth at bedtime.   11/07/2021  ? hypromellose (GENTEAL) 0.3 % GEL ophthalmic ointment Place 1 application. into both eyes at bedtime.   11/07/2021  ? loratadine (CLARITIN) 10 MG tablet Take 10 mg by mouth daily as needed for allergies.     ? losartan (COZAAR) 100 MG tablet Take 50 mg by mouth daily.   11/08/2021  ? Multiple Vitamin (MULTIVITAMIN WITH MINERALS) TABS tablet Take 1 tablet by mouth daily.   11/08/2021  ? sennosides-docusate sodium (SENOKOT-S) 8.6-50 MG tablet Take 1 tablet by mouth daily as needed for constipation.   Past Week  ? sertraline (ZOLOFT) 100 MG tablet Take 50 mg  by mouth daily.   11/08/2021  ? simvastatin (ZOCOR) 40 MG tablet Take 40 mg by mouth at bedtime.    11/07/2021  ? sodium chloride (OCEAN) 0.65 % SOLN nasal spray Place 1 spray into both nostrils as needed for congestion.   unk  ? spironolactone (ALDACTONE) 25 MG tablet Take 25 mg by mouth daily.   11/08/2021  ? traZODone (DESYREL) 150 MG tablet Take 75 mg by mouth at bedtime.   11/07/2021  ? WIXELA INHUB 250-50 MCG/ACT AEPB Inhale 1 puff into the lungs 2 (two) times daily.   11/08/2021  ? amLODipine (NORVASC) 10 MG tablet Take 10 mg by mouth daily. (Patient not taking: Reported on 11/09/2021)   Not Taking  ? budesonide-formoterol (SYMBICORT) 160-4.5 MCG/ACT inhaler Inhale 2 puffs into the lungs 2 (two) times daily as needed (shortness of breath).  (Patient not taking: Reported on 11/09/2021)   Not Taking  ? FLUoxetine (PROZAC) 20 MG capsule Take 60 mg by mouth daily.  (Patient not taking: Reported on 11/09/2021)   Not Taking  ? ?Scheduled: ? carvedilol  3.125 mg Oral BID WC  ? gabapentin  300 mg Oral QHS  ? losartan  50 mg Oral Daily  ? mometasone-formoterol  2 puff Inhalation BID  ? multivitamin with minerals  1 tablet Oral Daily  ? pantoprazole (PROTONIX) IV  40 mg Intravenous BID  ? sertraline  50 mg Oral Daily  ? simvastatin  40 mg Oral QHS  ? spironolactone  25 mg Oral Daily  ? traZODone  50 mg Oral QHS  ? ?Continuous: ? lactated ringers 50 mL/hr at 11/08/21 2342  ? ? ?Results for orders placed or performed during the hospital encounter of 11/08/21 (from the past 48 hour(s))  ?Comprehensive metabolic panel     Status: Abnormal  ? Collection Time: 11/08/21  7:34 PM  ?Result Value Ref Range  ? Sodium 142 135 - 145 mmol/L  ? Potassium 4.3 3.5 - 5.1 mmol/L  ? Chloride 111 98 - 111 mmol/L  ? CO2 25 22 - 32 mmol/L  ? Glucose, Bld 97 70 - 99 mg/dL  ?  Comment: Glucose reference range applies only to samples taken after fasting for at least 8 hours.  ? BUN 31 (H) 8 - 23 mg/dL  ? Creatinine, Ser 1.33 (H) 0.44 - 1.00 mg/dL  ? Calcium  9.6 8.9 - 10.3 mg/dL  ? Total Protein 7.1 6.5 - 8.1 g/dL  ? Albumin 4.2 3.5 - 5.0 g/dL  ? AST 19 15 - 41 U/L  ? ALT 19 0 - 44 U/L  ? Alkaline Phosphatase 84 38 - 126 U/L  ? Total Bilirubin 0.4 0.3 - 1.2 mg/dL  ? GFR, Estimated 41 (L) >60 mL/min  ?  Comment: (NOTE) ?Calculated  using the CKD-EPI Creatinine Equation (2021) ?  ? Anion gap 6 5 - 15  ?  Comment: Performed at [facility], [place] [address], [place], [place] [address]  ?CBC with Differential     Status: Abnormal  ? Collection Time: 11/08/21  7:34 PM  ?Result Value Ref Range  ? WBC 5.6 4.0 - 10.5 K/uL  ? RBC 3.17 (L) 3.87 - 5.11 MIL/uL  ? Hemoglobin 9.7 (L) 12.0 - 15.0 g/dL  ? HCT 29.3 (L) 36.0 - 46.0 %  ? MCV 92.4 80.0 - 100.0 fL  ? MCH 30.6 26.0 - 34.0 pg  ? MCHC 33.1 30.0 - 36.0 g/dL  ? RDW 13.7 11.5 - 15.5 %  ? Platelets 243 150 - 400 K/uL  ? nRBC 0.0 0.0 - 0.2 %  ? Neutrophils Relative % 46 %  ? Neutro Abs 2.5 1.7 - 7.7 K/uL  ? Lymphocytes Relative 44 %  ? Lymphs Abs 2.5 0.7 - 4.0 K/uL  ? Monocytes Relative 7 %  ? Monocytes Absolute 0.4 0.1 - 1.0 K/uL  ? Eosinophils Relative 2 %  ? Eosinophils Absolute 0.1 0.0 - 0.5 K/uL  ? Basophils Relative 1 %  ? Basophils Absolute 0.1 0.0 - 0.1 K/uL  ? Immature Granulocytes 0 %  ? Abs Immature Granulocytes 0.01 0.00 - 0.07 K/uL  ?  Comment: Performed at [facility], [place] [address] [place], [place] [address]  ?Lipase, blood     Status: None  ? Collection Time: 11/08/21  7:34 PM  ?Result Value Ref Range  ? Lipase 24 11 - 51 U/L  ?  Comment: Performed at [facility], [place] [address], [place], [place] [address]  ?Type and screen [place]     Status: None  ? Collection Time: 11/08/21  7:34 PM  ?Result Value Ref Range  ? ABO/RH(D) A POS   ? Antibody Screen NEG   ? Sample Expiration    ?  11/11/2021,2359 ?Performed at [facility], [place] [address], [place], [place] [address] ?  ?POC occult blood, ED Provider will collect      Status: Abnormal  ? Collection Time: 11/08/21 10:19 PM  ?Result Value Ref Range  ? Fecal Occult Bld POSITIVE (A) NEGATIVE  ?Hemoglobin and hematocrit, blood     Status: Abnormal  ? Collection Time: 11/09/21 12:4

## 2021-11-09 NOTE — H&P (View-Only) (Signed)
Reason for Consult: Melena with anemia. ?Reffering Physician: THP. ? ?Adrienne Rogers is an 81 y.o. female.  ?HPI: Adrienne Rogers is a 81 year old black female, with multiple medical problems listed below, includiing CKD Stage 3B, anemia of chronic disease, admitted with a history of melena for the last 2 days. Her hemoglobin has dropped from 9.7 gms/dl to 8.5 gms/dl. Her hemoglobin was 11.3 gms/dl on 11/04/21.  Patient gives a history of being weak and dizzy but denies any history of syncope or near syncopal events.  She gives a history of having a gastric ulcer over 20 years ago and has not taken any nonsteroidals since then. Her mother was diagnosed with colon cancer at the age of 36. She had a normal colonoscopy done in 2015 ? ?Past Medical History:  ?Diagnosis Date  ? Asthma   ? COPD (chronic obstructive pulmonary disease) ([place])   ? Depression   ? Hyperlipidemia   ? Hypertension   ? PTSD (post-traumatic stress disorder)   ? ?Past Surgical History:  ?Procedure Laterality Date  ? ABDOMINAL HYSTERECTOMY    ? 1981  ? BACK SURGERY    ? ACDF 2005  ? CHOLECYSTECTOMY    ? right knee arthroscopy    ? 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 Procedure: TOTAL KNEE ARTHROPLASTY;  Surgeon: Melrose Nakayama, MD;  Location: [facility];  Service: Orthopedics;  Laterality: Right;  ? ?History reviewed. No pertinent family history. ? ?Social History:  reports that she has never smoked. She has never used smokeless tobacco. She reports that she does not currently use alcohol. She reports that she does not currently use drugs. ? ?Allergies:  ?Allergies  ?Allergen Reactions  ? Lisinopril Tinitus  ? Ezetimibe-Simvastatin Other (See Comments)  ?  Muscle Pain  ? Fosamax  [Alendronate Sodium] Other (See Comments)  ?  Heartburn  ? Sulfa Antibiotics Itching and Rash  ? Bupropion Other (See Comments)  ?  vertigo  ? Doxepin Swelling  ? Prazosin   ?  Other reaction(s): Low blood pressure  ?  Tetracyclines & Related Other (See Comments)  ?  Swelling eruption of the lips  ? Eggs Or Egg-Derived Products Nausea And Vomiting  ? ?Medications: I have reviewed the patient's current medications. ?Prior to Admission:  ?Medications Prior to Admission  ?Medication Sig Dispense Refill Last Dose  ? acetaminophen (TYLENOL) 500 MG tablet Take 1,000 mg by mouth 2 (two) times daily as needed for moderate pain.   Past Week  ? albuterol (PROVENTIL HFA;VENTOLIN HFA) 108 (90 Base) MCG/ACT inhaler Inhale 2 puffs into the lungs every 6 (six) hours as needed for wheezing or shortness of breath.    unk  ? aspirin EC 325 MG EC tablet Take 1 tablet (325 mg total) by mouth 2 (two) times daily after a meal. (Patient taking differently: Take 81 mg by mouth daily.) 30 tablet 0 11/08/2021  ? Calcium Carb-Cholecalciferol 600-10 MG-MCG TABS Take 1 tablet by mouth daily.   11/08/2021  ? carvedilol (COREG) 25 MG tablet Take 12.5 tablets by mouth 2 (two) times daily.   11/08/2021 at 1000  ? Cholecalciferol 25 MCG (1000 UT) tablet Take 1,000 Units by mouth daily.   11/08/2021  ? docusate calcium (SURFAK) 240 MG capsule Take 240 mg by mouth daily as needed for mild constipation.    unk  ? fluticasone (FLONASE) 50 MCG/ACT nasal spray Place 2 sprays into both nostrils daily as needed for allergies or rhinitis.  Past Week  ? gabapentin (NEURONTIN) 300 MG capsule Take 300 mg by mouth at bedtime.   11/07/2021  ? hypromellose (GENTEAL) 0.3 % GEL ophthalmic ointment Place 1 application. into both eyes at bedtime.   11/07/2021  ? loratadine (CLARITIN) 10 MG tablet Take 10 mg by mouth daily as needed for allergies.     ? losartan (COZAAR) 100 MG tablet Take 50 mg by mouth daily.   11/08/2021  ? Multiple Vitamin (MULTIVITAMIN WITH MINERALS) TABS tablet Take 1 tablet by mouth daily.   11/08/2021  ? sennosides-docusate sodium (SENOKOT-S) 8.6-50 MG tablet Take 1 tablet by mouth daily as needed for constipation.   Past Week  ? sertraline (ZOLOFT) 100 MG tablet Take 50 mg  by mouth daily.   11/08/2021  ? simvastatin (ZOCOR) 40 MG tablet Take 40 mg by mouth at bedtime.    11/07/2021  ? sodium chloride (OCEAN) 0.65 % SOLN nasal spray Place 1 spray into both nostrils as needed for congestion.   unk  ? spironolactone (ALDACTONE) 25 MG tablet Take 25 mg by mouth daily.   11/08/2021  ? traZODone (DESYREL) 150 MG tablet Take 75 mg by mouth at bedtime.   11/07/2021  ? WIXELA INHUB 250-50 MCG/ACT AEPB Inhale 1 puff into the lungs 2 (two) times daily.   11/08/2021  ? amLODipine (NORVASC) 10 MG tablet Take 10 mg by mouth daily. (Patient not taking: Reported on 11/09/2021)   Not Taking  ? budesonide-formoterol (SYMBICORT) 160-4.5 MCG/ACT inhaler Inhale 2 puffs into the lungs 2 (two) times daily as needed (shortness of breath).  (Patient not taking: Reported on 11/09/2021)   Not Taking  ? FLUoxetine (PROZAC) 20 MG capsule Take 60 mg by mouth daily.  (Patient not taking: Reported on 11/09/2021)   Not Taking  ? ?Scheduled: ? carvedilol  3.125 mg Oral BID WC  ? gabapentin  300 mg Oral QHS  ? losartan  50 mg Oral Daily  ? mometasone-formoterol  2 puff Inhalation BID  ? multivitamin with minerals  1 tablet Oral Daily  ? pantoprazole (PROTONIX) IV  40 mg Intravenous BID  ? sertraline  50 mg Oral Daily  ? simvastatin  40 mg Oral QHS  ? spironolactone  25 mg Oral Daily  ? traZODone  50 mg Oral QHS  ? ?Continuous: ? lactated ringers 50 mL/hr at 11/08/21 2342  ? ? ?Results for orders placed or performed during the hospital encounter of 11/08/21 (from the past 48 hour(s))  ?Comprehensive metabolic panel     Status: Abnormal  ? Collection Time: 11/08/21  7:34 PM  ?Result Value Ref Range  ? Sodium 142 135 - 145 mmol/L  ? Potassium 4.3 3.5 - 5.1 mmol/L  ? Chloride 111 98 - 111 mmol/L  ? CO2 25 22 - 32 mmol/L  ? Glucose, Bld 97 70 - 99 mg/dL  ?  Comment: Glucose reference range applies only to samples taken after fasting for at least 8 hours.  ? BUN 31 (H) 8 - 23 mg/dL  ? Creatinine, Ser 1.33 (H) 0.44 - 1.00 mg/dL  ? Calcium  9.6 8.9 - 10.3 mg/dL  ? Total Protein 7.1 6.5 - 8.1 g/dL  ? Albumin 4.2 3.5 - 5.0 g/dL  ? AST 19 15 - 41 U/L  ? ALT 19 0 - 44 U/L  ? Alkaline Phosphatase 84 38 - 126 U/L  ? Total Bilirubin 0.4 0.3 - 1.2 mg/dL  ? GFR, Estimated 41 (L) >60 mL/min  ?  Comment: (NOTE) ?Calculated  using the CKD-EPI Creatinine Equation (2021) ?  ? Anion gap 6 5 - 15  ?  Comment: Performed at [facility], [place] [address], [place], [place] [address]  ?CBC with Differential     Status: Abnormal  ? Collection Time: 11/08/21  7:34 PM  ?Result Value Ref Range  ? WBC 5.6 4.0 - 10.5 K/uL  ? RBC 3.17 (L) 3.87 - 5.11 MIL/uL  ? Hemoglobin 9.7 (L) 12.0 - 15.0 g/dL  ? HCT 29.3 (L) 36.0 - 46.0 %  ? MCV 92.4 80.0 - 100.0 fL  ? MCH 30.6 26.0 - 34.0 pg  ? MCHC 33.1 30.0 - 36.0 g/dL  ? RDW 13.7 11.5 - 15.5 %  ? Platelets 243 150 - 400 K/uL  ? nRBC 0.0 0.0 - 0.2 %  ? Neutrophils Relative % 46 %  ? Neutro Abs 2.5 1.7 - 7.7 K/uL  ? Lymphocytes Relative 44 %  ? Lymphs Abs 2.5 0.7 - 4.0 K/uL  ? Monocytes Relative 7 %  ? Monocytes Absolute 0.4 0.1 - 1.0 K/uL  ? Eosinophils Relative 2 %  ? Eosinophils Absolute 0.1 0.0 - 0.5 K/uL  ? Basophils Relative 1 %  ? Basophils Absolute 0.1 0.0 - 0.1 K/uL  ? Immature Granulocytes 0 %  ? Abs Immature Granulocytes 0.01 0.00 - 0.07 K/uL  ?  Comment: Performed at [facility], [place] [address] [place], [place] [address]  ?Lipase, blood     Status: None  ? Collection Time: 11/08/21  7:34 PM  ?Result Value Ref Range  ? Lipase 24 11 - 51 U/L  ?  Comment: Performed at [facility], [place] [address] [place], [place] [address]  ?Type and screen [place]     Status: None  ? Collection Time: 11/08/21  7:34 PM  ?Result Value Ref Range  ? ABO/RH(D) A POS   ? Antibody Screen NEG   ? Sample Expiration    ?  11/11/2021,2359 ?Performed at [facility], [place] [address] [place],  [address] ?  ?POC occult blood, ED Provider will collect      Status: Abnormal  ? Collection Time: 11/08/21 10:19 PM  ?Result Value Ref Range  ? Fecal Occult Bld POSITIVE (A) NEGATIVE  ?Hemoglobin and hematocrit, blood     Status: Abnormal  ? Collection Time: 11/09/21 12:4

## 2021-11-09 NOTE — Evaluation (Signed)
Physical Therapy Evaluation ?Patient Details ?Name: Adrienne Rogers ?MRN: [id number] ?DOB: [birth year] ?Today's Date: 11/09/2021 ? ?History of Present Illness ? Patient is a 81 year old female who presented to the hosptial with dark stools and blood on toilet paper after completing hygiene tasks.patient is suspected to have GI bleed. PMH: R TKA, COPD, PTSD, HTN, HLD, recent hx of vertigo (thought to be medication induced per pt)  ?Clinical Impression ? On eval, pt was Min guard-Mod Ind with mobility. Upon arrival, she was in bathroom performing ADLs independently. She did require Min guard assist while ambulating in hallway due to a few brief instances of dizziness. Pt reports she recently had some issues with vertigo that have improved since she had some medications adjusted. She tolerated activity well. She plans to d/c back home where she lives alone. She reports she was planning to start some OP PT before having to be admitted into hospital. She could potentially benefit from an OPPT vestibular assessment as well if she continues to have issues with dizziness.    ?   ? ?Recommendations for follow up therapy are one component of a multi-disciplinary discharge planning process, led by the attending physician.  Recommendations may be updated based on patient status, additional functional criteria and insurance authorization. ? ?Follow Up Recommendations Outpatient PT (pt stated she was in process of starting OP PT. Could potentially beneift from vestibular assessment in that setting if she continues to have issues with dizziness.) ? ?  ?Assistance Recommended at Discharge None  ?Patient can return home with the following ?   ? ?  ?Equipment Recommendations None recommended by PT  ?Recommendations for Other Services ?    ?  ?Functional Status Assessment Patient has had a recent decline in their functional status and demonstrates the ability to make significant improvements in function in a reasonable and predictable  amount of time.  ? ?  ?Precautions / Restrictions Precautions ?Precautions: Fall ?Restrictions ?Weight Bearing Restrictions: No  ? ?  ? ?Mobility ? Bed Mobility ?  ?  ?  ?  ?  ?  ?  ?General bed mobility comments: up in bathroom at start of session ?  ? ?Transfers ?Overall transfer level: Modified independent ?  ?  ?  ?  ?  ?  ?  ?  ?  ?  ? ?Ambulation/Gait ?Ambulation/Gait assistance: Min guard ?Gait Distance (Feet): 150 Feet ?Assistive device: None (vs intermittent use of hallway handrail) ?Gait Pattern/deviations: Step-through pattern ?  ?  ?  ?General Gait Details: Intermittent unsteadiness-pt reports she still has instances of dizziness. Tolerated distance well. ? ?Stairs ?  ?  ?  ?  ?  ? ?Wheelchair Mobility ?  ? ?Modified Rankin (Stroke Patients Only) ?  ? ?  ? ?Balance Overall balance assessment: Mild deficits observed, not formally tested ?  ?  ?  ?  ?  ?  ?  ?  ?  ?  ?  ?  ?  ?  ?  ?  ?  ?  ?   ? ? ? ?Pertinent Vitals/Pain    ? ? ?Home Living Family/patient expects to be discharged to:: Private residence ?Living Arrangements: Alone ?  ?Type of Home: House ?  ?  ?  ?  ?Home Layout: Able to live on main level with bedroom/bathroom ?Home Equipment: Rollator (4 wheels);Cane - single point ?   ?  ?Prior Function Prior Level of Function : Independent/Modified Independent ?  ?  ?  ?  ?  ?  ?  ?  ?  ? ? ?  Hand Dominance  ?   ? ?  ?Extremity/Trunk Assessment  ? Upper Extremity Assessment ?Upper Extremity Assessment: Defer to OT evaluation ?  ? ?Lower Extremity Assessment ?Lower Extremity Assessment: Overall WFL for tasks assessed ?  ? ?Cervical / Trunk Assessment ?Cervical / Trunk Assessment: Normal  ?Communication  ? Communication: No difficulties  ?Cognition Arousal/Alertness: Awake/alert ?Behavior During Therapy: [facility] for tasks assessed/performed ?Overall Cognitive Status: Within Functional Limits for tasks assessed ?  ?  ?  ?  ?  ?  ?  ?  ?  ?  ?  ?  ?  ?  ?  ?  ?  ?  ?  ? ?  ?General Comments   ? ?  ?Exercises     ? ?Assessment/Plan  ?  ?PT Assessment Patient needs continued PT services  ?PT Problem List Decreased balance ? ?   ?  ?PT Treatment Interventions Gait training;Therapeutic activities;Therapeutic exercise;Patient/family education;Balance training;Functional mobility training   ? ?PT Goals (Current goals can be found in the Care Plan section)  ?Acute Rehab PT Goals ?Patient Stated Goal: to get better and get home ?PT Goal Formulation: With patient ?Time For Goal Achievement: 11/23/21 ?Potential to Achieve Goals: Good ? ?  ?Frequency Min 3X/week ?  ? ? ?Co-evaluation   ?  ?  ?  ?  ? ? ?  ?AM-PAC PT "6 Clicks" Mobility  ?Outcome Measure Help needed turning from your back to your side while in a flat bed without using bedrails?: None ?Help needed moving from lying on your back to sitting on the side of a flat bed without using bedrails?: None ?Help needed moving to and from a bed to a chair (including a wheelchair)?: None ?Help needed standing up from a chair using your arms (e.g., wheelchair or bedside chair)?: None ?Help needed to walk in hospital room?: A Little ?Help needed climbing 3-5 steps with a railing? : A Little ?6 Click Score: 22 ? ?  ?End of Session   ?Activity Tolerance: Patient tolerated treatment well ?Patient left: in chair;with call bell/phone within reach ?  ?  ?  ? ?Time: 9485-4627 ?PT Time Calculation (min) (ACUTE ONLY): 15 min ? ? ?Charges:   PT Evaluation ?$PT Eval Low Complexity: 1 Low ?  ?  ?   ? ? ? ? ? ?Mansfield Dann P, PT ?Acute Rehabilitation  ?Office: [phone] ?Pager: [phone] ? ?  ? ?

## 2021-11-09 NOTE — Progress Notes (Signed)
?  Progress Note ? ? ?Patient: Adrienne Rogers [id number] DOB: Jul 29, 1940 DOA: 11/08/2021     0 ?DOS: the patient was seen and examined on 11/09/2021 ?  ?Brief hospital course: ?81 y.o. 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  Had another episode of dark stools on the day of presentation which prompted her to come to the ED.  ? ?Assessment and Plan: ?No notes have been filed under this hospital service. ?Service: Hospitalist ? ?Presumed upper GI bleed ?Presented with melena, noted yesterday and today. ?Hemoglobin 11.3 K on 11/04/21. ?Presented with hemoglobin of 9.7 K. ?Serial H&H every 6 hours x 4 ?Transfuse hemoglobin less than 7.0, the patient is agreeable to transfusion if needed. ?IV Protonix 40 mg twice daily ? GI consulted, pending ?Will keep on clears for now ?  ?Hypertension, BP is elevated, not at goal ?Resume home oral antihypertensive at lower dose to avoid hypotension ?Coreg 3.125 mg twice daily. Cont spironolactone ?Continue to monitor vital signs. ?  ?CKD 3B ?Presented with baseline creatinine 1.3 with GFR 41 ?Avoid nephrotoxic agents, dehydration and hypotension. ?Cr down to 1.1 ?Repeat bmet in AM ?  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 ?Resume home Zoloft ?  ?Hyperlipidemia ?Resume home Zocor ?  ?Physical debility/intermittent dizziness ?Therapy recs thus far for outpatient PT ?Fall precautions. ?  ? ?  ? ?Subjective: Without complaints this AM. Denies abd pain or nausea ? ?Physical Exam: ?Vitals:  ? 11/09/21 0441 11/09/21 0744 11/09/21 1005 11/09/21 1317  ?BP: (!) 153/78 132/60 (!) 165/66 (!) 138/57  ?Pulse: (!) 59 (!) 56  (!) 53  ?Resp: 16 15  12   ?Temp: 98.1 ?F (36.7 ?C) 98.3  ?F (36.8 ?C)  98.5 ?F (36.9 ?C)  ?TempSrc: Oral Oral  Oral  ?SpO2: 97% 100%  97%  ?Weight:      ?Height:      ? ?General exam: Awake, laying in bed, in nad ?Respiratory system: Normal respiratory effort, no wheezing ?Cardiovascular system: regular rate, s1, s2 ?Gastrointestinal system: Soft, nondistended, positive BS ?Central nervous system: CN2-12 grossly intact, strength intact ?Extremities: Perfused, no clubbing ?Skin: Normal skin turgor, no notable skin lesions seen ?Psychiatry: Mood normal // no visual hallucinations  ? ?Data Reviewed: ? ?Labs reviewed: Hgb 8.5 ? ?Family Communication: Pt in room, family not at bedside ? ?Disposition: ?Status is: Observation ?The patient remains OBS appropriate and will d/c before 2 midnights. ? Planned Discharge Destination: Home ? ? ? ?Author: ?Marylu Lund, MD ?11/09/2021 3:28 PM ? ?For on call review [email or web address].  ?

## 2021-11-10 ENCOUNTER — Encounter (HOSPITAL_COMMUNITY): Payer: Self-pay | Admitting: Internal Medicine

## 2021-11-10 ENCOUNTER — Inpatient Hospital Stay (HOSPITAL_COMMUNITY): Payer: Medicare Other | Admitting: Certified Registered"

## 2021-11-10 ENCOUNTER — Encounter (HOSPITAL_COMMUNITY)
Admission: EM | Disposition: A | Discharge status: Home / Self Care | Payer: Self-pay | Source: Home / Self Care | Attending: Internal Medicine

## 2021-11-10 DIAGNOSIS — I129 Hypertensive chronic kidney disease with stage 1 through stage 4 chronic kidney disease, or unspecified chronic kidney disease: Secondary | ICD-10-CM

## 2021-11-10 DIAGNOSIS — K3189 Other diseases of stomach and duodenum: Secondary | ICD-10-CM

## 2021-11-10 DIAGNOSIS — K254 Chronic or unspecified gastric ulcer with hemorrhage: Secondary | ICD-10-CM

## 2021-11-10 DIAGNOSIS — D62 Acute posthemorrhagic anemia: Secondary | ICD-10-CM

## 2021-11-10 DIAGNOSIS — N1832 Chronic kidney disease, stage 3b: Secondary | ICD-10-CM

## 2021-11-10 HISTORY — PX: ESOPHAGOGASTRODUODENOSCOPY: SHX5428

## 2021-11-10 HISTORY — PX: BIOPSY: SHX5522

## 2021-11-10 LAB — COMPREHENSIVE METABOLIC PANEL
ALT: 17 U/L (ref 0–44)
AST: 20 U/L (ref 15–41)
Albumin: 3.6 g/dL (ref 3.5–5.0)
Alkaline Phosphatase: 73 U/L (ref 38–126)
Anion gap: 7 (ref 5–15)
BUN: 16 mg/dL (ref 8–23)
CO2: 21 mmol/L — ABNORMAL LOW (ref 22–32)
Calcium: 9 mg/dL (ref 8.9–10.3)
Chloride: 115 mmol/L — ABNORMAL HIGH (ref 98–111)
Creatinine, Ser: 1.12 mg/dL — ABNORMAL HIGH (ref 0.44–1.00)
GFR, Estimated: 50 mL/min — ABNORMAL LOW (ref >60)
Glucose, Bld: 94 mg/dL (ref 70–99)
Potassium: 4.3 mmol/L (ref 3.5–5.1)
Sodium: 143 mmol/L (ref 135–145)
Total Bilirubin: 0.5 mg/dL (ref 0.3–1.2)
Total Protein: 6.2 g/dL — ABNORMAL LOW (ref 6.5–8.1)

## 2021-11-10 LAB — CBC
HCT: 27.1 % — ABNORMAL LOW (ref 36.0–46.0)
Hemoglobin: 8.8 g/dL — ABNORMAL LOW (ref 12.0–15.0)
MCH: 30.1 pg (ref 26.0–34.0)
MCHC: 32.5 g/dL (ref 30.0–36.0)
MCV: 92.8 fL (ref 80.0–100.0)
Platelets: 185 10*3/uL (ref 150–400)
RBC: 2.92 MIL/uL — ABNORMAL LOW (ref 3.87–5.11)
RDW: 13.4 % (ref 11.5–15.5)
WBC: 4.7 10*3/uL (ref 4.0–10.5)
nRBC: 0 % (ref 0.0–0.2)

## 2021-11-10 SURGERY — EGD (ESOPHAGOGASTRODUODENOSCOPY)
Anesthesia: Monitor Anesthesia Care

## 2021-11-10 SURGERY — ESOPHAGOGASTRODUODENOSCOPY (EGD) WITH PROPOFOL
Anesthesia: Monitor Anesthesia Care

## 2021-11-10 MED ORDER — LIDOCAINE 2% (20 MG/ML) 5 ML SYRINGE
INTRAMUSCULAR | Status: DC | PRN
  Administered 2021-11-10: 60 mg via INTRAVENOUS

## 2021-11-10 MED ORDER — SODIUM CHLORIDE 0.9 % IV SOLN
INTRAVENOUS | Status: DC
Start: 2021-11-10 — End: 2021-11-10

## 2021-11-10 MED ORDER — PROPOFOL 500 MG/50ML IV EMUL
INTRAVENOUS | Status: DC | PRN
  Administered 2021-11-10: 125 ug/kg/min via INTRAVENOUS

## 2021-11-10 MED ORDER — PROPOFOL 10 MG/ML IV BOLUS
INTRAVENOUS | Status: DC | PRN
  Administered 2021-11-10: 20 mg via INTRAVENOUS
  Administered 2021-11-10: 10 mg via INTRAVENOUS
  Administered 2021-11-10 (×2): 20 mg via INTRAVENOUS

## 2021-11-10 MED ORDER — CARVEDILOL 25 MG PO TABS
12.5000 mg | ORAL_TABLET | Freq: Two times a day (BID) | ORAL | Status: AC
Start: 2021-11-10 — End: ?

## 2021-11-10 MED ORDER — PANTOPRAZOLE SODIUM 40 MG PO TBEC: 40.0000 mg | DELAYED_RELEASE_TABLET | Freq: Every day | ORAL | 0 refills | Status: DC

## 2021-11-10 MED ORDER — LACTATED RINGERS IV SOLN: INTRAVENOUS | Status: DC | PRN

## 2021-11-10 NOTE — Anesthesia Procedure Notes (Signed)
Procedure Name: [place] ?Date/Time: 11/10/2021 1:44 PM ?Performed by: Eben Burow, CRNA ?Pre-anesthesia Checklist: Patient identified, Emergency Drugs available, Suction available, Patient being monitored and Timeout performed ?Oxygen Delivery Method: Simple face mask ?Placement Confirmation: positive ETCO2 ? ? ? ? ?

## 2021-11-10 NOTE — Anesthesia Postprocedure Evaluation (Signed)
Anesthesia Post Note ? ?Patient: Adrienne Rogers ? ?Procedure(s) Performed: ESOPHAGOGASTRODUODENOSCOPY (EGD) ?BIOPSY ? ?  ? ?Patient location during evaluation: Endoscopy ?Anesthesia Type: MAC ?Level of consciousness: awake and alert ?Pain management: pain level controlled ?Vital Signs Assessment: post-procedure vital signs reviewed and stable ?Respiratory status: spontaneous breathing, nonlabored ventilation and respiratory function stable ?Cardiovascular status: blood pressure returned to baseline and stable ?Postop Assessment: no apparent nausea or vomiting ?Anesthetic complications: no ? ? ?No notable events documented. ? ?Last Vitals:  ?Vitals:  ? 11/10/21 1420 11/10/21 1430  ?BP: (!) 170/78 (!) 181/87  ?Pulse: (!) 58 (!) 55  ?Resp: (!) 21 12  ?Temp:    ?SpO2: 98% 98%  ?  ?Last Pain:  ?Vitals:  ? 11/10/21 1430  ?TempSrc:   ?PainSc: 0-No pain  ? ? ?  ?  ?  ?  ?  ?  ? ?Lidia Collum ? ? ? ? ?

## 2021-11-10 NOTE — Anesthesia Preprocedure Evaluation (Signed)
Anesthesia Evaluation  ?Patient identified by MRN, date of birth, ID band ?Patient awake ? ? ? ?Reviewed: ?Allergy & Precautions, NPO status , Patient's Chart, lab work & pertinent test results ? ?History of Anesthesia Complications ?Negative for: history of anesthetic complications ? ?Airway ?Mallampati: II ? ?TM Distance: >3 FB ?Neck ROM: Full ? ? ? Dental ?  ?Pulmonary ?asthma , sleep apnea and Continuous Positive Airway Pressure Ventilation , COPD,  ?  ?Pulmonary exam normal ? ? ? ? ? ? ? Cardiovascular ?hypertension, Pt. on medications and Pt. on home beta blockers ?Normal cardiovascular exam ? ? ?  ?Neuro/Psych ?Anxiety Depression negative neurological ROS ?   ? GI/Hepatic ?Neg liver ROS, GERD  ,Presumed UGIB ?  ?Endo/Other  ?negative endocrine ROS ? Renal/GU ?CRFRenal disease (CKD3b)  ?negative genitourinary ?  ?Musculoskeletal ? ?(+) Arthritis ,  ? Abdominal ?  ?Peds ? Hematology ? ?(+) Blood dyscrasia, anemia ,   ?Anesthesia Other Findings ? ? Reproductive/Obstetrics ? ?  ? ? ? ? ? ? ? ? ? ? ? ? ? ?  ?  ? ? ? ? ? ? ? ? ?Anesthesia Physical ?Anesthesia Plan ? ?ASA: 3 ? ?Anesthesia Plan: MAC  ? ?Post-op Pain Management: Minimal or no pain anticipated  ? ?Induction: Intravenous ? ?PONV Risk Score and Plan: 2 and Propofol infusion, TIVA and Treatment may vary due to age or medical condition ? ?Airway Management Planned: Natural Airway, Nasal Cannula and Simple Face Mask ? ?Additional Equipment: None ? ?Intra-op Plan:  ? ?Post-operative Plan:  ? ?Informed Consent: I have reviewed the patients History and Physical, chart, labs and discussed the procedure including the risks, benefits and alternatives for the proposed anesthesia with the patient or authorized representative who has indicated his/her understanding and acceptance.  ? ? ? ? ? ?Plan Discussed with:  ? ?Anesthesia Plan Comments:   ? ? ? ? ? ? ?Anesthesia Quick Evaluation ? ?

## 2021-11-10 NOTE — TOC Initial Note (Signed)
Transition of Care (TOC) - Initial/Assessment Note  ? ? ?Patient Details  ?Name: Adrienne Rogers ?MRN: [id number] ?Date of Birth: [birth year] ? ?Transition of Care [facility]) CM/SW Contact:    ?Golda Acre, RN ?Phone Number: ?11/10/2021, 9:27 AM ? ?Clinical Narrative:                 ? ?Transition of Care (TOC) Screening Note ? ? ?Patient Details  ?Name: Adrienne Rogers ?Date of Birth: 1940-12-01 ? ? ?Transition of Care [facility]) CM/SW Contact:    ?Golda Acre, RN ?Phone Number: ?11/10/2021, 9:27 AM ? ? ? ?Transition of Care Department [facility]) has reviewed patient and no TOC needs have been identified at this time. We will continue to monitor patient advancement through interdisciplinary progression rounds. If new patient transition needs arise, please place a TOC consult. ? ? ? ?Expected Discharge Plan: Home/Self Care ?Barriers to Discharge: No Barriers Identified ? ? ?Patient Goals and CMS Choice ?Patient states their goals for this hospitalization and ongoing recovery are:: to go home ?CMS Medicare.gov Compare Post Acute Care list provided to:: Patient ?Choice offered to / list presented to : Patient ? ?Expected Discharge Plan and Services ?Expected Discharge Plan: Home/Self Care ?  ?Discharge Planning Services: CM Consult ?  ?Living arrangements for the past 2 months: Single Family Home ?                ?  ?  ?  ?  ?  ?  ?  ?  ?  ?  ? ?Prior Living Arrangements/Services ?Living arrangements for the past 2 months: Single Family Home ?Lives with:: Self ?Patient language and need for interpreter reviewed:: Yes ?Do you feel safe going back to the place where you live?: Yes      ?  ?  ?  ?Criminal Activity/Legal Involvement Pertinent to Current Situation/Hospitalization: No - Comment as needed ? ?Activities of Daily Living ?Home Assistive Devices/Equipment: Cane (specify quad or straight), Dentures (specify type), Hearing aid (hearing aids at home) ?ADL Screening (condition at time of admission) ?Patient's  cognitive ability adequate to safely complete daily activities?: Yes ?Is the patient deaf or have difficulty hearing?: Yes ?Does the patient have difficulty seeing, even when wearing glasses/contacts?: No ?Does the patient have difficulty concentrating, remembering, or making decisions?: No ?Patient able to express need for assistance with ADLs?: Yes ?Does the patient have difficulty dressing or bathing?: Yes ?Independently performs ADLs?: Yes (appropriate for developmental age) ?Does the patient have difficulty walking or climbing stairs?: No ?Weakness of Legs: None ?Weakness of Arms/Hands: None ? ?Permission Sought/Granted ?  ?  ?   ?   ?   ?   ? ?Emotional Assessment ?Appearance:: Appears stated age ?  ?  ?Orientation: : Oriented to Self, Oriented to Place, Oriented to  Time, Oriented to Situation ?Alcohol / Substance Use: Not Applicable ?Psych Involvement: No (comment) ? ?Admission diagnosis:  GI bleed [K92.2] ?Patient Active Problem List  ? Diagnosis Date Noted  ? GI bleed 11/08/2021  ? Primary osteoarthritis of right knee 03/14/2018  ? Bilateral primary osteoarthritis of knee 12/15/2017  ? Primary osteoarthritis of left knee 08/16/2016  ? Chronic pain of right knee 06/16/2016  ? Chronic right-sided low back pain with right-sided sciatica 06/16/2016  ? ?PCP:  Bedelia Person, MD ?Pharmacy:   ?[id number] [place], [place] - [address] AT [facility] [address] ?[address] ?[place] [address] ?Phone: [phone]  Fax: [phone] ? ? ? ? ?Social Determinants of Health (SDOH) Interventions ?  ? ?Readmission Risk Interventions ?   ? View : No data to display.  ?  ?  ?  ? ? ? ?

## 2021-11-10 NOTE — Plan of Care (Signed)
PIV removed. DC paperwork reviewed with pt. Questions answered. ? ? ?Problem: Education: ?Goal: Knowledge of General Education information will improve ?Description: Including pain rating scale, medication(s)/side effects and non-pharmacologic comfort measures ?Outcome: Completed/Met ?  ?Problem: Health Behavior/Discharge Planning: ?Goal: Ability to manage health-related needs will improve ?Outcome: Completed/Met ?  ?Problem: Clinical Measurements: ?Goal: Ability to maintain clinical measurements within normal limits will improve ?Outcome: Completed/Met ?Goal: Will remain free from infection ?Outcome: Completed/Met ?Goal: Diagnostic test results will improve ?Outcome: Completed/Met ?Goal: Respiratory complications will improve ?Outcome: Completed/Met ?Goal: Cardiovascular complication will be avoided ?Outcome: Completed/Met ?  ?Problem: Activity: ?Goal: Risk for activity intolerance will decrease ?Outcome: Completed/Met ?  ?Problem: Nutrition: ?Goal: Adequate nutrition will be maintained ?Outcome: Completed/Met ?  ?Problem: Coping: ?Goal: Level of anxiety will decrease ?Outcome: Completed/Met ?  ?Problem: Elimination: ?Goal: Will not experience complications related to bowel motility ?Outcome: Completed/Met ?Goal: Will not experience complications related to urinary retention ?Outcome: Completed/Met ?  ?Problem: Pain Managment: ?Goal: General experience of comfort will improve ?Outcome: Completed/Met ?  ?Problem: Safety: ?Goal: Ability to remain free from injury will improve ?Outcome: Completed/Met ?  ?Problem: Skin Integrity: ?Goal: Risk for impaired skin integrity will decrease ?Outcome: Completed/Met ?  ?

## 2021-11-10 NOTE — TOC Transition Note (Signed)
Transition of Care (TOC) - CM/SW Discharge Note ? ? ?Patient Details  ?Name: DEEPTI GUNAWAN ?MRN: [id number] ?Date of Birth: [birth year] ? ?Transition of Care [facility]) CM/SW Contact:  ?Golda Acre, RN ?Phone Number: ?11/10/2021, 3:21 PM ? ? ?Clinical Narrative:    ?Pt dcd to home with self care. ? ? ?Final next level of care: Home/Self Care ?Barriers to Discharge: No Barriers Identified ? ? ?Patient Goals and CMS Choice ?Patient states their goals for this hospitalization and ongoing recovery are:: to go home ?CMS Medicare.gov Compare Post Acute Care list provided to:: Patient ?Choice offered to / list presented to : Patient ? ?Discharge Placement ?  ?           ?  ?  ?  ?  ? ?Discharge Plan and Services ?  ?Discharge Planning Services: CM Consult ?           ?  ?  ?  ?  ?  ?  ?  ?  ?  ?  ? ?Social Determinants of Health (SDOH) Interventions ?  ? ? ?Readmission Risk Interventions ?   ? View : No data to display.  ?  ?  ?  ? ? ? ? ? ?

## 2021-11-10 NOTE — Discharge Summary (Signed)
?Physician Discharge Summary ?  ?Patient: Adrienne Rogers MRN: [id number] DOB: Jan 24, 1941  ?Admit date:     11/08/2021  ?Discharge date: 11/10/21  ?Discharge Physician: Rickey Barbara  ? ?PCP: Bedelia Person, MD  ? ?Recommendations at discharge:  ? ? Follow up with PCP in 1-2 weeks ?Follow up with Dr. Loreta Ave in 2 weeks ? ?Discharge Diagnoses: ?Principal Problem: ?  GI bleed ? ?Resolved Problems: ?  * No resolved hospital problems. * ? ?Hospital Course: ?81 y.o. 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  Had another episode of dark stools on the day of presentation which prompted her to come to the ED.  ? ?Assessment and Plan: ?No notes have been filed under this hospital service. ?Service: Hospitalist ? ?Presumed upper GI bleed ?Presented with melena, noted yesterday and today. ?Continued IV Protonix 40 mg twice daily this visit ? GI consulted, pt underwent EGD with findings of gastric erosions with stigmata of recent bleeding. Recs for daily PPI, avoid NSAIDs ?  ?Hypertension, BP is elevated, not at goal ?Resume home oral antihypertensive at lower dose to avoid hypotension ?Coreg 3.125 mg twice daily. Cont spironolactone ?Continue to monitor vital signs. ?  ?CKD 3B ?Presented with baseline creatinine 1.3 with GFR 41 ?Avoid nephrotoxic agents, dehydration and hypotension. ?Cr down to 1.1 ?  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 ?Resume home Zoloft ?  ?Hyperlipidemia ?Resume home Zocor ?  ?Physical debility/intermittent dizziness ?Therapy recs thus far for outpatient PT ?Fall precautions. ?  ?  ? ? ?Consultants: GI ?Procedures performed: EGD 11/10/21  ?Disposition: Home ?Diet recommendation:  ?Regular  diet ?DISCHARGE MEDICATION: ?Allergies as of 11/10/2021   ? ?   Reactions  ? Lisinopril Tinitus  ? Ezetimibe-simvastatin Other (See Comments)  ? Muscle Pain  ? Fosamax  [alendronate Sodium] Other (See Comments)  ? Heartburn  ? Sulfa Antibiotics Itching, Rash  ? Bupropion Other (See Comments)  ? vertigo  ? Doxepin Swelling  ? Prazosin   ? Other reaction(s): Low blood pressure  ? Tetracyclines & Related Other (See Comments)  ? Swelling eruption of the lips  ? Eggs Or Egg-derived Products Nausea And Vomiting  ? ?  ? ?  ?Medication List  ?  ? ?STOP taking these medications   ? ?amLODipine 10 MG tablet ?Commonly known as: NORVASC ?  ?FLUoxetine 20 MG capsule ?Commonly known as: PROZAC ?  ? ?  ? ?TAKE these medications   ? ?acetaminophen 500 MG tablet ?Commonly known as: TYLENOL ?Take 1,000 mg by mouth 2 (two) times daily as needed for moderate pain. ?  ?albuterol 108 (90 Base) MCG/ACT inhaler ?Commonly known as: VENTOLIN HFA ?Inhale 2 puffs into the lungs every 6 (six) hours as needed for wheezing or shortness of breath. ?  ?aspirin 325 MG EC tablet ?Take 1 tablet (325 mg total) by mouth 2 (two) times daily after a meal. ?What changed:  ?how much to take ?when to take this ?  ?budesonide-formoterol 160-4.5 MCG/ACT inhaler ?Commonly known as: SYMBICORT ?Inhale 2 puffs into the lungs 2 (two) times daily as needed (shortness of breath). ?  ?Calcium Carb-Cholecalciferol 600-10 MG-MCG Tabs ?Take 1 tablet by mouth daily. ?  ?carvedilol 25 MG tablet ?Commonly known  as: COREG ?Take 0.5 tablets (12.5 mg total) by mouth 2 (two) times daily. ?What changed: how much to take ?  ?Cholecalciferol 25 MCG (1000 UT) tablet ?Take 1,000 Units by mouth daily. ?  ?docusate calcium 240 MG capsule ?Commonly known as: SURFAK ?Take 240 mg by mouth daily as needed for mild constipation. ?  ?fluticasone 50 MCG/ACT nasal spray ?Commonly known as: FLONASE ?Place 2 sprays into both nostrils daily as needed for allergies or rhinitis. ?  ?gabapentin 300  MG capsule ?Commonly known as: NEURONTIN ?Take 300 mg by mouth at bedtime. ?  ?hypromellose 0.3 % Gel ophthalmic ointment ?Commonly known as: GENTEAL ?Place 1 application. into both eyes at bedtime. ?  ?loratadine 10 MG tablet ?Commonly known as: CLARITIN ?Take 10 mg by mouth daily as needed for allergies. ?  ?losartan 100 MG tablet ?Commonly known as: COZAAR ?Take 50 mg by mouth daily. ?  ?multivitamin with minerals Tabs tablet ?Take 1 tablet by mouth daily. ?  ?pantoprazole 40 MG tablet ?Commonly known as: Protonix ?Take 1 tablet (40 mg total) by mouth daily. ?  ?sennosides-docusate sodium 8.6-50 MG tablet ?Commonly known as: SENOKOT-S ?Take 1 tablet by mouth daily as needed for constipation. ?  ?sertraline 100 MG tablet ?Commonly known as: ZOLOFT ?Take 50 mg by mouth daily. ?  ?simvastatin 40 MG tablet ?Commonly known as: ZOCOR ?Take 40 mg by mouth at bedtime. ?  ?sodium chloride 0.65 % Soln nasal spray ?Commonly known as: OCEAN ?Place 1 spray into both nostrils as needed for congestion. ?  ?spironolactone 25 MG tablet ?Commonly known as: ALDACTONE ?Take 25 mg by mouth daily. ?  ?traZODone 150 MG tablet ?Commonly known as: DESYREL ?Take 75 mg by mouth at bedtime. ?  ?Wixela Inhub 250-50 MCG/ACT Aepb ?Generic drug: fluticasone-salmeterol ?Inhale 1 puff into the lungs 2 (two) times daily. ?  ? ?  ? ? ?Discharge Exam: ?Filed Weights  ? 11/10/21 0500 11/10/21 0540 11/10/21 1259  ?Weight: 86.4 kg 84 kg 84 kg  ? ?General exam: Awake, laying in bed, in nad ?Respiratory system: Normal respiratory effort, no wheezing ?Cardiovascular system: regular rate, s1, s2 ?Gastrointestinal system: Soft, nondistended, positive BS ?Central nervous system: CN2-12 grossly intact, strength intact ?Extremities: Perfused, no clubbing ?Skin: Normal skin turgor, no notable skin lesions seen ?Psychiatry: Mood normal // no visual hallucinations  ? ?Condition at discharge: fair ? ?The results of significant diagnostics from this hospitalization  (including imaging, microbiology, ancillary and laboratory) are listed below for reference.  ? ?Imaging Studies: ?No results found. ? ?Microbiology: ?Results for orders placed or performed during the hospital encounter of 03/09/18  ?Surgical pcr screen     Status: None  ? Collection Time: 03/09/18 10:09 AM  ? Specimen: Nasal Mucosa; Nasal Swab  ?Result Value Ref Range Status  ? MRSA, PCR NEGATIVE NEGATIVE Final  ? Staphylococcus aureus NEGATIVE NEGATIVE Final  ?  Comment: (NOTE) ?The Xpert SA Assay (FDA approved for NASAL specimens in patients 322 ?years of age and older), is one component of a comprehensive ?surveillance program. It is not intended to diagnose infection nor to ?guide or monitor treatment. ?Performed at [facility], [address] [place], [place] ?[id number] ?  ? ? ?Labs: ?CBC: ?Recent Labs  ?Lab 11/08/21 ?1934 11/09/21 ?[id number] 11/09/21 ?[id number] 11/09/21 ?1030 11/09/21 ?1658 11/10/21 ?0412  ?WBC 5.6  --  5.0  --   --  4.7  ?NEUTROABS 2.5  --  2.1  --   --   --   ?  HGB 9.7* 8.7* 8.5* 8.5* 8.6* 8.8*  ?HCT 29.3* 27.7* 25.7* 26.3* 26.7* 27.1*  ?MCV 92.4  --  92.4  --   --  92.8  ?PLT 243  --  209  --   --  185  ? ?Basic Metabolic Panel: ?Recent Labs  ?Lab 11/08/21 ?1934 11/09/21 ?1610 11/10/21 ?9604  ?NA 142 141 143  ?K 4.3 3.9 4.3  ?CL 111 113* 115*  ?CO2 25 21* 21*  ?GLUCOSE 97 91 94  ?BUN 31* 26* 16  ?CREATININE 1.33* 1.10* 1.12*  ?CALCIUM 9.6 9.0 9.0  ?MG  --  2.3  --   ?PHOS  --  2.9  --   ? ?Liver Function Tests: ?Recent Labs  ?Lab 11/08/21 ?1934 11/09/21 ?5409 11/10/21 ?8119  ?AST 19 18 20   ?ALT 19 16 17   ?ALKPHOS 84 73 73  ?BILITOT 0.4 0.5 0.5  ?PROT 7.1 6.2* 6.2*  ?ALBUMIN 4.2 3.6 3.6  ? ?CBG: ?No results for input(s): GLUCAP in the last 168 hours. ? ?Discharge time spent: less than 30 minutes. ? ?Signed: ? , MD ?[facility] ?11/10/2021 ?

## 2021-11-10 NOTE — Transfer of Care (Signed)
Immediate Anesthesia Transfer of Care Note ? ?Patient: Adrienne Rogers ? ?Procedure(s) Performed: ESOPHAGOGASTRODUODENOSCOPY (EGD) ?BIOPSY ? ?Patient Location: PACU and Endoscopy Unit ? ?Anesthesia Type:MAC ? ?Level of Consciousness: awake, alert  and patient cooperative ? ?Airway & Oxygen Therapy: Patient Spontanous Breathing and Patient connected to face mask oxygen ? ?Post-op Assessment: Report given to RN and Post -op Vital signs reviewed and stable ? ?Post vital signs: Reviewed and stable ? ?Last Vitals:  ?Vitals Value Taken Time  ?BP    ?Temp    ?Pulse    ?Resp    ?SpO2    ? ? ?Last Pain:  ?Vitals:  ? 11/10/21 1259  ?TempSrc: Oral  ?PainSc: 0-No pain  ?   ? ?Patients Stated Pain Goal: 2 (11/09/21 2317) ? ?Complications: No notable events documented. ?

## 2021-11-10 NOTE — Interval H&P Note (Signed)
History and Physical Interval Note: ? ?11/10/2021 ?1:39 PM ? ?Adrienne Rogers  has presented today for surgery, with the diagnosis of IDA, Melena.  The various methods of treatment have been discussed with the patient and family. After consideration of risks, benefits and other options for treatment, the patient has consented to  Procedure(s) with comments: ?ESOPHAGOGASTRODUODENOSCOPY (EGD) (N/A) - IDA, Melena as a surgical intervention.  The patient's history has been reviewed, patient examined, no change in status, stable for surgery.  I have reviewed the patient's chart and labs.  Questions were answered to the patient's satisfaction.   ? ? ?[name] ? ? ?

## 2021-11-10 NOTE — Op Note (Signed)
[facility] ?Patient Name: Adrienne Rogers ?Procedure Date: 11/10/2021 ?MRN: [id number] ?Attending MD: Carol Ada , MD ?Date of Birth: 1941/01/14 ?CSN: [id number] ?Age: 81 ?Admit Type: Inpatient ?Procedure:                Upper GI endoscopy ?Indications:              Heme positive stool, Melena ?Providers:                Carol Ada, MD, Allayne Gitelman, RN, Janie Billups,  ?                          Technician, Jefm Miles CRNA ?Referring MD:              ?Medicines:                Propofol per Anesthesia ?Complications:            No immediate complications. ?Estimated Blood Loss:     Estimated blood loss was minimal. ?Procedure:                Pre-Anesthesia Assessment: ?                          - Prior to the procedure, a History and Physical  ?                          was performed, and patient medications and  ?                          allergies were reviewed. The patient's tolerance of  ?                          previous anesthesia was also reviewed. The risks  ?                          and benefits of the procedure and the sedation  ?                          options and risks were discussed with the patient.  ?                          All questions were answered, and informed consent  ?                          was obtained. Prior Anticoagulants: The patient has  ?                          taken no previous anticoagulant or antiplatelet  ?                          agents. ASA Grade Assessment: III - A patient with  ?                          severe systemic disease. After reviewing the risks  ?  and benefits, the patient was deemed in  ?                          satisfactory condition to undergo the procedure. ?                          - Sedation was administered by an anesthesia  ?                          professional. Deep sedation was attained. ?                          After obtaining informed consent, the endoscope was  ?                          passed  under direct vision. Throughout the  ?                          procedure, the patient's blood pressure, pulse, and  ?                          oxygen saturations were monitored continuously. The  ?                          [id number] Olympus endoscope was introduced  ?                          through the mouth, and advanced to the second part  ?                          of duodenum. The upper GI endoscopy was  ?                          accomplished without difficulty. The patient  ?                          tolerated the procedure well. ?Scope In: ?Scope Out: ?Findings: ?     The esophagus was normal. ?     Multiple dispersed erosions with stigmata of recent bleeding were found  ?     in the gastric body and in the gastric antrum. Biopsies were taken with  ?     a cold forceps for Helicobacter pylori testing. ?     Patchy mildly erythematous mucosa without active bleeding and with no  ?     stigmata of bleeding was found in the duodenal bulb. ?     Multiple superficial/healing erosions were found in the gastric body and  ?     antrum. A minimal amount of hematin was found in the gastric lumen. ?Impression:               - Normal esophagus. ?                          - Gastric erosions with stigmata of recent  ?  bleeding. Biopsied. ?                          - Erythematous duodenopathy. ?Moderate Sedation: ?     Not Applicable - Patient had care per Anesthesia. ?Recommendation:           - Return patient to hospital ward for ongoing care. ?                          - Resume regular diet. ?                          - Continue present medications. ?                          - Await pathology results. ?                          - Avoid NSAIDs. ?                          - PPI QD. ?                          - Okay to D/C home. ?                          - Follow up with Dr. Collene Mares in 2 weeks. ?Procedure Code(s):        --- Professional --- ?                          [phone],  Esophagogastroduodenoscopy, flexible,  ?                          transoral; with biopsy, single or multiple ?Diagnosis Code(s):        --- Professional --- ?                          K25.4, Chronic or unspecified gastric ulcer with  ?                          hemorrhage ?                          K31.89, Other diseases of stomach and duodenum ?                          R19.5, Other fecal abnormalities ?                          K92.1, Melena (includes Hematochezia) ?CPT copyright 2019 [employer]. All rights reserved. ?The codes documented in this report are preliminary and upon coder review may  ?be revised to meet current compliance requirements. ?Carol Ada, MD ?Carol Ada, MD ?11/10/2021 2:05:31 PM ?This report has been signed electronically. ?Number of Addenda: 0 ?

## 2021-11-11 NOTE — ED Provider Notes (Signed)
?[facility] ?Provider Note ? ? ?CSN: [id number] ?Arrival date & time: 11/08/21  1830 ? ?  ? ?History ? ?Chief Complaint  ?Patient presents with  ? Rectal Bleeding  ? ? ?Adrienne AgeeBernice L Rogers is a 81 y.o. female. ? ?Patient complains of having black stools for couple days.  Patient has a history of hypertension hyperlipidemia ? ?The history is provided by medical records. No language interpreter was used.  ?Rectal Bleeding ?Quality:  Black and tarry ?Amount:  Moderate ?Timing:  Constant ?Chronicity:  New ?Context: not anal fissures   ?Similar prior episodes: no   ?Relieved by:  Nothing ?Associated symptoms: no abdominal pain and no epistaxis   ?Risk factors: no anticoagulant use   ? ?  ? ?Home Medications ?Prior to Admission medications   ?Medication Sig Start Date End Date Taking? Authorizing Provider  ?acetaminophen (TYLENOL) 500 MG tablet Take 1,000 mg by mouth 2 (two) times daily as needed for moderate pain.   Yes [provider]  ?albuterol (PROVENTIL HFA;VENTOLIN HFA) 108 (90 Base) MCG/ACT inhaler Inhale 2 puffs into the lungs every 6 (six) hours as needed for wheezing or shortness of breath.    Yes [provider]  ?aspirin EC 325 MG EC tablet Take 1 tablet (325 mg total) by mouth 2 (two) times daily after a meal. ?Patient taking differently: Take 81 mg by mouth daily. 03/15/18  Yes Elodia FlorenceNida, Andrew, PA-C  ?Calcium Carb-Cholecalciferol 600-10 MG-MCG TABS Take 1 tablet by mouth daily. 01/10/21  Yes [provider]  ?Cholecalciferol 25 MCG (1000 UT) tablet Take 1,000 Units by mouth daily. 01/10/21  Yes [provider]  ?docusate calcium (SURFAK) 240 MG capsule Take 240 mg by mouth daily as needed for mild constipation.    Yes [provider]  ?fluticasone (FLONASE) 50 MCG/ACT nasal spray Place 2 sprays into both nostrils daily as needed for allergies or rhinitis.   Yes [provider]  ?gabapentin (NEURONTIN) 300 MG capsule Take 300  mg by mouth at bedtime. 10/06/21  Yes [provider]  ?hypromellose (GENTEAL) 0.3 % GEL ophthalmic ointment Place 1 application. into both eyes at bedtime. 07/13/21  Yes [provider]  ?loratadine (CLARITIN) 10 MG tablet Take 10 mg by mouth daily as needed for allergies.   Yes [provider]  ?losartan (COZAAR) 100 MG tablet Take 50 mg by mouth daily. 11/04/21  Yes [provider]  ?Multiple Vitamin (MULTIVITAMIN WITH MINERALS) TABS tablet Take 1 tablet by mouth daily.   Yes [provider]  ?pantoprazole (PROTONIX) 40 MG tablet Take 1 tablet (40 mg total) by mouth daily. 11/10/21 12/10/21 Yes Jerald Kiefhiu, Stephen K, MD  ?sennosides-docusate sodium (SENOKOT-S) 8.6-50 MG tablet Take 1 tablet by mouth daily as needed for constipation.   Yes [provider]  ?sertraline (ZOLOFT) 100 MG tablet Take 50 mg by mouth daily. 10/02/21  Yes [provider]  ?simvastatin (ZOCOR) 40 MG tablet Take 40 mg by mouth at bedtime.    Yes [provider]  ?sodium chloride (OCEAN) 0.65 % SOLN nasal spray Place 1 spray into both nostrils as needed for congestion.   Yes [provider]  ?spironolactone (ALDACTONE) 25 MG tablet Take 25 mg by mouth daily. 09/20/21  Yes [provider]  ?traZODone (DESYREL) 150 MG tablet Take 75 mg by mouth at bedtime.   Yes [provider]  ?Monte FantasiaWIXELA INHUB 250-50 MCG/ACT AEPB Inhale 1 puff into the lungs 2 (two) times daily. 09/19/21  Yes [provider]  ?budesonide-formoterol (SYMBICORT) 160-4.5 MCG/ACT inhaler Inhale 2 puffs into the lungs 2 (two) times daily as needed (shortness of breath).  ?Patient not taking: Reported on 11/09/2021    [provider]  ?carvedilol (COREG) 25 MG tablet Take 0.5 tablets (12.5 mg total) by mouth 2 (two) times daily. 11/10/21   [name], MD  ?   ? ?Allergies    ?Lisinopril, Ezetimibe-simvastatin, Fosamax  [alendronate sodium], Sulfa antibiotics, Bupropion, Doxepin,  Prazosin, Tetracyclines & related, and Eggs or egg-derived products   ? ?Review of Systems   ?Review of Systems  ?Constitutional:  Negative for appetite change and fatigue.  ?HENT:  Negative for congestion, ear discharge, nosebleeds and sinus pressure.   ?Eyes:  Negative for discharge.  ?Respiratory:  Negative for cough.   ?Cardiovascular:  Negative for chest pain.  ?Gastrointestinal:  Positive for hematochezia. Negative for abdominal pain and diarrhea.  ?     Black stool  ?Genitourinary:  Negative for frequency and hematuria.  ?Musculoskeletal:  Negative for back pain.  ?Skin:  Negative for rash.  ?Neurological:  Negative for seizures and headaches.  ?Psychiatric/Behavioral:  Negative for hallucinations.   ? ?Physical Exam ?Updated Vital Signs ?BP (!) 181/87   Pulse (!) 55   Temp 98 ?F (36.7 ?C) (Temporal)   Resp 12   Ht 5\' 5"  (1.651 m)   Wt 84 kg   SpO2 98%   BMI 30.82 kg/m?  ?Physical Exam ?Vitals and nursing note reviewed.  ?Constitutional:   ?   Appearance: She is well-developed.  ?HENT:  ?   Head: Normocephalic.  ?   Nose: Nose normal.  ?Eyes:  ?   General: No scleral icterus. ?   Conjunctiva/sclera: Conjunctivae normal.  ?Neck:  ?   Thyroid: No thyromegaly.  ?Cardiovascular:  ?   Rate and Rhythm: Normal rate and regular rhythm.  ?   Heart sounds: No murmur heard. ?  No friction rub. No gallop.  ?Pulmonary:  ?   Breath sounds: No stridor. No wheezing or rales.  ?Chest:  ?   Chest wall: No tenderness.  ?Abdominal:  ?   General: There is no distension.  ?   Tenderness: There is no abdominal tenderness. There is no rebound.  ?Genitourinary: ?   Comments: Black stool heme positive ?Musculoskeletal:     ?   General: Normal range of motion.  ?   Cervical back: Neck supple.  ?Lymphadenopathy:  ?   Cervical: No cervical adenopathy.  ?Skin: ?   Findings: No erythema or rash.  ?Neurological:  ?   Mental Status: She is alert and oriented to person, place, and time.  ?   Motor: No abnormal muscle tone.  ?    Coordination: Coordination normal.  ?Psychiatric:     ?   Behavior: Behavior normal.  ? ? ?ED Results / Procedures / Treatments   ?Labs ?(all labs ordered are listed, but only abnormal results are displayed) ?Labs Reviewed  ?COMPREHENSIVE METABOLIC PANEL - Abnormal; Notable for the following components:  ?    Result Value  ? BUN 31 (*)   ? Creatinine, Ser 1.33 (*)   ? GFR, Estimated 41 (*)   ? All other components within normal limits  ?CBC WITH DIFFERENTIAL/PLATELET - Abnormal; Notable for the following components:  ? RBC 3.17 (*)   ? Hemoglobin 9.7 (*)   ? HCT 29.3 (*)   ? All other components within normal limits  ?HEMOGLOBIN AND HEMATOCRIT, BLOOD - Abnormal;  Notable for the following components:  ? Hemoglobin 8.7 (*)   ? HCT 27.7 (*)   ? All other components within normal limits  ?HEMOGLOBIN AND HEMATOCRIT, BLOOD - Abnormal; Notable for the following components:  ? Hemoglobin 8.5 (*)   ? HCT 26.3 (*)   ? All other components within normal limits  ?HEMOGLOBIN AND HEMATOCRIT, BLOOD - Abnormal; Notable for the following components:  ? Hemoglobin 8.6 (*)   ? HCT 26.7 (*)   ? All other components within normal limits  ?CBC WITH DIFFERENTIAL/PLATELET - Abnormal; Notable for the following components:  ? RBC 2.78 (*)   ? Hemoglobin 8.5 (*)   ? HCT 25.7 (*)   ? All other components within normal limits  ?COMPREHENSIVE METABOLIC PANEL - Abnormal; Notable for the following components:  ? Chloride 113 (*)   ? CO2 21 (*)   ? BUN 26 (*)   ? Creatinine, Ser 1.10 (*)   ? Total Protein 6.2 (*)   ? GFR, Estimated 51 (*)   ? All other components within normal limits  ?COMPREHENSIVE METABOLIC PANEL - Abnormal; Notable for the following components:  ? Chloride 115 (*)   ? CO2 21 (*)   ? Creatinine, Ser 1.12 (*)   ? Total Protein 6.2 (*)   ? GFR, Estimated 50 (*)   ? All other components within normal limits  ?CBC - Abnormal; Notable for the following components:  ? RBC 2.92 (*)   ? Hemoglobin 8.8 (*)   ? HCT 27.1 (*)   ? All other  components within normal limits  ?POC OCCULT BLOOD, ED - Abnormal; Notable for the following components:  ? Fecal Occult Bld POSITIVE (*)   ? All other components within normal limits  ?LIPASE, BLOOD  ?MAGNESIUM  ?PHOSPHOR

## 2021-11-12 ENCOUNTER — Encounter (HOSPITAL_COMMUNITY): Payer: Self-pay | Admitting: Gastroenterology

## 2021-11-12 LAB — SURGICAL PATHOLOGY

## 2023-06-20 ENCOUNTER — Other Ambulatory Visit: Payer: Self-pay

## 2023-06-20 ENCOUNTER — Encounter (HOSPITAL_BASED_OUTPATIENT_CLINIC_OR_DEPARTMENT_OTHER): Payer: Self-pay

## 2023-06-20 ENCOUNTER — Emergency Department (HOSPITAL_BASED_OUTPATIENT_CLINIC_OR_DEPARTMENT_OTHER)
Admission: EM | Admit: 2023-06-20 | Discharge: 2023-06-20 | Disposition: A | Discharge status: Home / Self Care | Payer: Medicare Other | Attending: Emergency Medicine | Admitting: Emergency Medicine

## 2023-06-20 DIAGNOSIS — K279 Peptic ulcer, site unspecified, unspecified as acute or chronic, without hemorrhage or perforation: Secondary | ICD-10-CM | POA: Insufficient documentation

## 2023-06-20 DIAGNOSIS — R109 Unspecified abdominal pain: Secondary | ICD-10-CM | POA: Diagnosis present

## 2023-06-20 DIAGNOSIS — Z7982 Long term (current) use of aspirin: Secondary | ICD-10-CM | POA: Insufficient documentation

## 2023-06-20 LAB — CBC WITH DIFFERENTIAL/PLATELET
Abs Immature Granulocytes: 0.01 10*3/uL (ref 0.00–0.07)
Basophils Absolute: 0 10*3/uL (ref 0.0–0.1)
Basophils Relative: 1 %
Eosinophils Absolute: 0.1 10*3/uL (ref 0.0–0.5)
Eosinophils Relative: 2 %
HCT: 35.8 % — ABNORMAL LOW (ref 36.0–46.0)
Hemoglobin: 11.7 g/dL — ABNORMAL LOW (ref 12.0–15.0)
Immature Granulocytes: 0 %
Lymphocytes Relative: 25 %
Lymphs Abs: 1.2 10*3/uL (ref 0.7–4.0)
MCH: 29.7 pg (ref 26.0–34.0)
MCHC: 32.7 g/dL (ref 30.0–36.0)
MCV: 90.9 fL (ref 80.0–100.0)
Monocytes Absolute: 0.3 10*3/uL (ref 0.1–1.0)
Monocytes Relative: 6 %
Neutro Abs: 3.2 10*3/uL (ref 1.7–7.7)
Neutrophils Relative %: 66 %
Platelets: 250 10*3/uL (ref 150–400)
RBC: 3.94 MIL/uL (ref 3.87–5.11)
RDW: 13.5 % (ref 11.5–15.5)
WBC: 4.8 10*3/uL (ref 4.0–10.5)
nRBC: 0 % (ref 0.0–0.2)

## 2023-06-20 LAB — COMPREHENSIVE METABOLIC PANEL
ALT: 10 U/L (ref 0–44)
AST: 15 U/L (ref 15–41)
Albumin: 4.5 g/dL (ref 3.5–5.0)
Alkaline Phosphatase: 72 U/L (ref 38–126)
Anion gap: 7 (ref 5–15)
BUN: 20 mg/dL (ref 8–23)
CO2: 28 mmol/L (ref 22–32)
Calcium: 10.1 mg/dL (ref 8.9–10.3)
Chloride: 107 mmol/L (ref 98–111)
Creatinine, Ser: 1.42 mg/dL — ABNORMAL HIGH (ref 0.44–1.00)
GFR, Estimated: 37 mL/min — ABNORMAL LOW (ref >60)
Glucose, Bld: 109 mg/dL — ABNORMAL HIGH (ref 70–99)
Potassium: 4.7 mmol/L (ref 3.5–5.1)
Sodium: 142 mmol/L (ref 135–145)
Total Bilirubin: 0.4 mg/dL (ref <1.2)
Total Protein: 7.4 g/dL (ref 6.5–8.1)

## 2023-06-20 LAB — CBC
HCT: 34 % — ABNORMAL LOW (ref 36.0–46.0)
Hemoglobin: 11 g/dL — ABNORMAL LOW (ref 12.0–15.0)
MCH: 29.3 pg (ref 26.0–34.0)
MCHC: 32.4 g/dL (ref 30.0–36.0)
MCV: 90.7 fL (ref 80.0–100.0)
Platelets: 229 10*3/uL (ref 150–400)
RBC: 3.75 MIL/uL — ABNORMAL LOW (ref 3.87–5.11)
RDW: 13.4 % (ref 11.5–15.5)
WBC: 4.6 10*3/uL (ref 4.0–10.5)
nRBC: 0 % (ref 0.0–0.2)

## 2023-06-20 MED ORDER — SUCRALFATE 1 G PO TABS: 1.0000 g | ORAL_TABLET | Freq: Three times a day (TID) | ORAL | 0 refills | Status: DC

## 2023-06-20 MED ORDER — SUCRALFATE 1 GM/10ML PO SUSP
1.0000 g | Freq: Once | ORAL | Status: AC
Start: 2023-06-20 — End: 2023-06-20
  Administered 2023-06-20: 1 g via ORAL
  Filled 2023-06-20: qty 10

## 2023-06-20 NOTE — ED Provider Notes (Signed)
[facility] AT [facility] Provider Note   CSN: [id number] Arrival date & time: 06/20/23  1015     History  Chief Complaint  Patient presents with   Abdominal Pain    Adrienne Rogers is a 82 y.o. female.  HPI    82 year old patient comes in with chief complaint of abdominal pain, and dark emesis earlier today.  Patient has known history of peptic ulcer disease.  She indicates that she does not take her Protonix regularly as prescribed.  Over the last few days, she has had some constipation for which she has taken laxative.  Patient has been having some abdominal discomfort, usually about 1 to 2 hours after p.o. intake.  Pain is generalized.  She has taken laxatives and has had loose bowel movements, but the pain is continued.  Today she had an episode where dark fluid just regurgitated out.  She did not quite have emesis, but spit up some dark fluid -and therefore she decided to come to the ER.  Patient has not seen any melena.  Review of system is negative for fevers, chills, nausea, vomiting. Home Medications Prior to Admission medications   Medication Sig Start Date End Date Taking? Authorizing Provider  sucralfate (CARAFATE) 1 g tablet Take 1 tablet (1 g total) by mouth 4 (four) times daily -  with meals and at bedtime. 06/20/23  Yes [name], MD  acetaminophen (TYLENOL) 500 MG tablet Take 1,000 mg by mouth 2 (two) times daily as needed for moderate pain.    [provider]  albuterol (PROVENTIL HFA;VENTOLIN HFA) 108 (90 Base) MCG/ACT inhaler Inhale 2 puffs into the lungs every 6 (six) hours as needed for wheezing or shortness of breath.     [provider]  aspirin EC 325 MG EC tablet Take 1 tablet (325 mg total) by mouth 2 (two) times daily after a meal. Patient taking differently: Take 81 mg by mouth daily. 03/15/18   [name], PA-C  budesonide-formoterol (SYMBICORT) 160-4.5 MCG/ACT inhaler Inhale 2 puffs into the  lungs 2 (two) times daily as needed (shortness of breath).  Patient not taking: Reported on 11/09/2021    [provider]  Calcium Carb-Cholecalciferol 600-10 MG-MCG TABS Take 1 tablet by mouth daily. 01/10/21   [provider]  carvedilol (COREG) 25 MG tablet Take 0.5 tablets (12.5 mg total) by mouth 2 (two) times daily. 11/10/21   [name], MD  Cholecalciferol 25 MCG (1000 UT) tablet Take 1,000 Units by mouth daily. 01/10/21   [provider]  docusate calcium (SURFAK) 240 MG capsule Take 240 mg by mouth daily as needed for mild constipation.     [provider]  fluticasone (FLONASE) 50 MCG/ACT nasal spray Place 2 sprays into both nostrils daily as needed for allergies or rhinitis.    [provider]  gabapentin (NEURONTIN) 300 MG capsule Take 300 mg by mouth at bedtime. 10/06/21   [provider]  hypromellose (GENTEAL) 0.3 % GEL ophthalmic ointment Place 1 application. into both eyes at bedtime. 07/13/21   [provider]  loratadine (CLARITIN) 10 MG tablet Take 10 mg by mouth daily as needed for allergies.    [provider]  losartan (COZAAR) 100 MG tablet Take 50 mg by mouth daily. 11/04/21   [provider]  Multiple Vitamin (MULTIVITAMIN WITH MINERALS) TABS tablet Take 1 tablet by mouth daily.    [provider]  pantoprazole (PROTONIX) 40 MG tablet Take 1 tablet (40  mg total) by mouth daily. 11/10/21 12/10/21  [name], MD  sennosides-docusate sodium (SENOKOT-S) 8.6-50 MG tablet Take 1 tablet by mouth daily as needed for constipation.    [provider]  sertraline (ZOLOFT) 100 MG tablet Take 50 mg by mouth daily. 10/02/21   [provider]  simvastatin (ZOCOR) 40 MG tablet Take 40 mg by mouth at bedtime.     [provider]  sodium chloride (OCEAN) 0.65 % SOLN nasal spray Place 1 spray into both nostrils as needed for congestion.    [provider]  spironolactone  (ALDACTONE) 25 MG tablet Take 25 mg by mouth daily. 09/20/21   [provider]  traZODone (DESYREL) 150 MG tablet Take 75 mg by mouth at bedtime.    [provider]  Monte Fantasia INHUB 250-50 MCG/ACT AEPB Inhale 1 puff into the lungs 2 (two) times daily. 09/19/21   [provider]      Allergies    Lisinopril, Ezetimibe-simvastatin, Fosamax  [alendronate sodium], Sulfa antibiotics, Bupropion, Doxepin, Prazosin, Tetracyclines & related, and Egg-derived products    Review of Systems   Review of Systems  All other systems reviewed and are negative.   Physical Exam Updated Vital Signs BP (!) 172/80 (BP Location: Right Arm)   Pulse (!) 53   Temp 98 F (36.7 C) (Oral)   Resp 17   Ht 5\' 5"  (1.651 m)   Wt 86.2 kg   SpO2 99%   BMI 31.62 kg/m  Physical Exam Vitals and nursing note reviewed.  Constitutional:      Appearance: She is well-developed.  HENT:     Head: Atraumatic.  Cardiovascular:     Rate and Rhythm: Normal rate.  Pulmonary:     Effort: Pulmonary effort is normal.  Abdominal:     Tenderness: There is no abdominal tenderness.  Musculoskeletal:     Cervical back: Normal range of motion and neck supple.  Skin:    General: Skin is warm and dry.  Neurological:     Mental Status: She is alert and oriented to person, place, and time.     ED Results / Procedures / Treatments   Labs (all labs ordered are listed, but only abnormal results are displayed) Labs Reviewed  COMPREHENSIVE METABOLIC PANEL - Abnormal; Notable for the following components:      Result Value   Glucose, Bld 109 (*)    Creatinine, Ser 1.42 (*)    GFR, Estimated 37 (*)    All other components within normal limits  CBC WITH DIFFERENTIAL/PLATELET - Abnormal; Notable for the following components:   Hemoglobin 11.7 (*)    HCT 35.8 (*)    All other components within normal limits  CBC - Abnormal; Notable for the following components:   RBC 3.75 (*)    Hemoglobin 11.0 (*)    HCT  34.0 (*)    All other components within normal limits    EKG None  Radiology No results found.  Procedures Procedures    Medications Ordered in ED Medications  sucralfate (CARAFATE) 1 GM/10ML suspension 1 g (1 g Oral Given 06/20/23 1405)    ED Course/ Medical Decision Making/ A&P                                 Medical Decision Making Amount and/or Complexity of Data Reviewed Labs: ordered.  Risk Prescription drug management.   This patient presents to the ED  with chief complaint(s) of persistent abdominal discomfort over the last 2 weeks that has been intermittent, irregular bowel movements with pertinent past medical history of peptic ulcer disease, hysterectomy, appendectomy, cholecystectomy.The complaint involves an extensive differential diagnosis and also carries with it a high risk of complications and morbidity.    The differential diagnosis includes : Peptic ulcer disease, melena, small bowel obstruction, ileus, tumor  The initial plan is to get basic labs.  Patient currently has no abdominal pain and has reassuring abdominal exam.  Her pain is about an hour and a half postprandial -more consistent with colitis.  Will get basic labs, trend CBC, specifically hemoglobin and also consider CT scan if her white count is significantly elevated.   Additional history obtained: Records reviewed previous admission documents and previous upper endoscopy that revealed gastritis and may be some duodenitis  Independent labs interpretation:  The following labs were independently interpreted: Initial hemoglobin is reassuring at over 11.  CBC is normal.  BUN is normal.   Treatment and Reassessment: Patient kept in the ER for close to 5 hours.  She did not have any emesis in the ER challenge.  Hemoglobin did drop from 11.7-11, but patient actually feels better.  Consultation: - Consulted or discussed management/test interpretation with external professional: Dr. Elnoria Howard, GI.   They are comfortable with patient being seen in the outpatient setting.  Patient lives alone.  She however prefers going home with a close outpatient follow-up.  We will add Carafate to her regimen.  She will start taking her PPI as prescribed.  She will return to the ER if she starts having worsening weakness, dizziness, near fainting or fainting, dark vomit, dark stool or bloody stools.   Final Clinical Impression(s) / ED Diagnoses Final diagnoses:  PUD (peptic ulcer disease)    Rx / DC Orders ED Discharge Orders          Ordered    sucralfate (CARAFATE) 1 g tablet  3 times daily with meals & bedtime        06/20/23 1506              [name], MD 06/20/23 1541

## 2023-06-20 NOTE — ED Notes (Signed)
RN offered to ambulate with patient to lobby and pt refused stating she felt comfortable walking.

## 2023-06-20 NOTE — Discharge Instructions (Addendum)
You are seen in the ER for abdominal pain.  We suspect that your pain is because of peptic ulcer disease.  Please start taking Carafate that has been prescribed with your meals. Call Dr. Elnoria Howard for a close follow-up.  He is your GI doctor.  Return to the ER immediately if you start having coffee-ground vomit, dizziness, lightheadedness, near fainting or fainting, dark tarry stools, bloody stools.

## 2023-06-20 NOTE — ED Notes (Addendum)
RN called lab requesting update on blood work delay. Per lab, blood was about to be processed.

## 2023-06-20 NOTE — ED Triage Notes (Signed)
In for eval of abd pain about 2 hours after eating for weeks. She is requiring laxatives and stool softeners to pass her bowel movements. Also spitting up dark material today.

## 2023-07-01 ENCOUNTER — Emergency Department (HOSPITAL_COMMUNITY)
Admission: EM | Admit: 2023-07-01 | Discharge: 2023-07-01 | Discharge status: Against Medical Advice | Payer: No Typology Code available for payment source | Attending: Emergency Medicine | Admitting: Emergency Medicine

## 2023-07-01 DIAGNOSIS — K625 Hemorrhage of anus and rectum: Secondary | ICD-10-CM | POA: Diagnosis present

## 2023-07-01 DIAGNOSIS — R42 Dizziness and giddiness: Secondary | ICD-10-CM | POA: Insufficient documentation

## 2023-07-01 DIAGNOSIS — Z5321 Procedure and treatment not carried out due to patient leaving prior to being seen by health care provider: Secondary | ICD-10-CM | POA: Insufficient documentation

## 2023-07-01 LAB — COMPREHENSIVE METABOLIC PANEL
ALT: 11 U/L (ref 0–44)
AST: 14 U/L — ABNORMAL LOW (ref 15–41)
Albumin: 3.6 g/dL (ref 3.5–5.0)
Alkaline Phosphatase: 73 U/L (ref 38–126)
Anion gap: 9 (ref 5–15)
BUN: 16 mg/dL (ref 8–23)
CO2: 24 mmol/L (ref 22–32)
Calcium: 9.3 mg/dL (ref 8.9–10.3)
Chloride: 109 mmol/L (ref 98–111)
Creatinine, Ser: 1.35 mg/dL — ABNORMAL HIGH (ref 0.44–1.00)
GFR, Estimated: 39 mL/min — ABNORMAL LOW (ref >60)
Glucose, Bld: 110 mg/dL — ABNORMAL HIGH (ref 70–99)
Potassium: 3.9 mmol/L (ref 3.5–5.1)
Sodium: 142 mmol/L (ref 135–145)
Total Bilirubin: 0.3 mg/dL (ref <1.2)
Total Protein: 6.6 g/dL (ref 6.5–8.1)

## 2023-07-01 LAB — CBC WITH DIFFERENTIAL/PLATELET
Abs Immature Granulocytes: 0.01 10*3/uL (ref 0.00–0.07)
Basophils Absolute: 0 10*3/uL (ref 0.0–0.1)
Basophils Relative: 1 %
Eosinophils Absolute: 0.2 10*3/uL (ref 0.0–0.5)
Eosinophils Relative: 3 %
HCT: 31.6 % — ABNORMAL LOW (ref 36.0–46.0)
Hemoglobin: 10.2 g/dL — ABNORMAL LOW (ref 12.0–15.0)
Immature Granulocytes: 0 %
Lymphocytes Relative: 28 %
Lymphs Abs: 1.4 10*3/uL (ref 0.7–4.0)
MCH: 29.2 pg (ref 26.0–34.0)
MCHC: 32.3 g/dL (ref 30.0–36.0)
MCV: 90.5 fL (ref 80.0–100.0)
Monocytes Absolute: 0.6 10*3/uL (ref 0.1–1.0)
Monocytes Relative: 11 %
Neutro Abs: 2.9 10*3/uL (ref 1.7–7.7)
Neutrophils Relative %: 57 %
Platelets: 241 10*3/uL (ref 150–400)
RBC: 3.49 MIL/uL — ABNORMAL LOW (ref 3.87–5.11)
RDW: 13.3 % (ref 11.5–15.5)
WBC: 5.1 10*3/uL (ref 4.0–10.5)
nRBC: 0 % (ref 0.0–0.2)

## 2023-07-01 NOTE — ED Triage Notes (Signed)
Pt states that earlier today she had BM and noticed bright red blood in her stool. Endorses dizziness. Denies NSAID use. Pt recently seen and diagnosed with PUD and given carafate.

## 2023-07-01 NOTE — ED Provider Triage Note (Signed)
Emergency Medicine Provider Triage Evaluation Note  Adrienne Rogers , a 82 y.o. female  was evaluated in triage.  Pt complains of rectal bleeding.  Review of Systems  Positive:  Negative:   Physical Exam  BP (!) 154/82 (BP Location: Right Arm)   Pulse 65   Temp 97.8 F (36.6 C) (Oral)   Resp 16   SpO2 97%  Gen:   Awake, no distress   Resp:  Normal effort  MSK:   Moves extremities without difficulty  Other:    Medical Decision Making  Medically screening exam initiated at 1:17 PM.  Appropriate orders placed.  Adrienne Rogers was informed that the remainder of the evaluation will be completed by another provider, this initial triage assessment does not replace that evaluation, and the importance of remaining in the ED until their evaluation is complete.  Was seen 10 days ago for PUD. Was sent home with Carafate. Patient saw bright red blood with stool this morning. Blood was on toilet paper - not filling up the toiler. Patient also with dizziness at baseline that is worsened today. No abdominal pain. No blood thinners.   Dorthy Cooler, [place] 07/01/23 1319

## 2023-07-01 NOTE — ED Notes (Signed)
Pt called for vitals x3 and to be roomed, no answer.

## 2023-07-21 ENCOUNTER — Other Ambulatory Visit (HOSPITAL_COMMUNITY): Payer: Self-pay | Admitting: Gastroenterology

## 2023-07-21 DIAGNOSIS — R1032 Left lower quadrant pain: Secondary | ICD-10-CM

## 2023-07-22 ENCOUNTER — Ambulatory Visit (HOSPITAL_BASED_OUTPATIENT_CLINIC_OR_DEPARTMENT_OTHER)
Admission: RE | Admit: 2023-07-22 | Discharge: 2023-07-22 | Disposition: A | Discharge status: Home / Self Care | Payer: Medicare Other | Source: Ambulatory Visit | Attending: Gastroenterology | Admitting: Gastroenterology

## 2023-07-22 DIAGNOSIS — R1032 Left lower quadrant pain: Secondary | ICD-10-CM | POA: Diagnosis present

## 2023-07-22 MED ORDER — IOHEXOL 300 MG/ML  SOLN
100.0000 mL | Freq: Once | INTRAMUSCULAR | Status: AC | PRN
  Administered 2023-07-22: 100 mL via INTRAVENOUS

## 2023-08-02 ENCOUNTER — Other Ambulatory Visit: Payer: Self-pay

## 2023-08-02 ENCOUNTER — Inpatient Hospital Stay (HOSPITAL_COMMUNITY)
Admission: EM | Admit: 2023-08-02 | Discharge: 2023-08-09 | DRG: 330 | Disposition: A | Discharge status: Home Health | Payer: Non-veteran care | Source: Ambulatory Visit | Attending: Physician Assistant | Admitting: Physician Assistant

## 2023-08-02 ENCOUNTER — Emergency Department (HOSPITAL_COMMUNITY): Payer: Non-veteran care

## 2023-08-02 ENCOUNTER — Inpatient Hospital Stay (HOSPITAL_COMMUNITY): Payer: Non-veteran care

## 2023-08-02 ENCOUNTER — Encounter (HOSPITAL_COMMUNITY): Payer: Self-pay | Admitting: Emergency Medicine

## 2023-08-02 DIAGNOSIS — I951 Orthostatic hypotension: Secondary | ICD-10-CM | POA: Diagnosis present

## 2023-08-02 DIAGNOSIS — F418 Other specified anxiety disorders: Secondary | ICD-10-CM | POA: Diagnosis not present

## 2023-08-02 DIAGNOSIS — I10 Essential (primary) hypertension: Secondary | ICD-10-CM | POA: Diagnosis present

## 2023-08-02 DIAGNOSIS — Z823 Family history of stroke: Secondary | ICD-10-CM

## 2023-08-02 DIAGNOSIS — K56691 Other complete intestinal obstruction: Secondary | ICD-10-CM | POA: Diagnosis present

## 2023-08-02 DIAGNOSIS — K219 Gastro-esophageal reflux disease without esophagitis: Secondary | ICD-10-CM | POA: Diagnosis present

## 2023-08-02 DIAGNOSIS — C772 Secondary and unspecified malignant neoplasm of intra-abdominal lymph nodes: Secondary | ICD-10-CM | POA: Diagnosis present

## 2023-08-02 DIAGNOSIS — Z7982 Long term (current) use of aspirin: Secondary | ICD-10-CM

## 2023-08-02 DIAGNOSIS — Z881 Allergy status to other antibiotic agents status: Secondary | ICD-10-CM

## 2023-08-02 DIAGNOSIS — Z800 Family history of malignant neoplasm of digestive organs: Secondary | ICD-10-CM | POA: Diagnosis not present

## 2023-08-02 DIAGNOSIS — Z79899 Other long term (current) drug therapy: Secondary | ICD-10-CM | POA: Diagnosis not present

## 2023-08-02 DIAGNOSIS — E876 Hypokalemia: Secondary | ICD-10-CM | POA: Diagnosis present

## 2023-08-02 DIAGNOSIS — Z7951 Long term (current) use of inhaled steroids: Secondary | ICD-10-CM

## 2023-08-02 DIAGNOSIS — K660 Peritoneal adhesions (postprocedural) (postinfection): Secondary | ICD-10-CM | POA: Diagnosis present

## 2023-08-02 DIAGNOSIS — R32 Unspecified urinary incontinence: Secondary | ICD-10-CM | POA: Diagnosis not present

## 2023-08-02 DIAGNOSIS — R109 Unspecified abdominal pain: Secondary | ICD-10-CM | POA: Diagnosis present

## 2023-08-02 DIAGNOSIS — Z882 Allergy status to sulfonamides status: Secondary | ICD-10-CM

## 2023-08-02 DIAGNOSIS — Z8249 Family history of ischemic heart disease and other diseases of the circulatory system: Secondary | ICD-10-CM | POA: Diagnosis not present

## 2023-08-02 DIAGNOSIS — C184 Malignant neoplasm of transverse colon: Secondary | ICD-10-CM | POA: Diagnosis present

## 2023-08-02 DIAGNOSIS — C189 Malignant neoplasm of colon, unspecified: Secondary | ICD-10-CM | POA: Diagnosis not present

## 2023-08-02 DIAGNOSIS — E278 Other specified disorders of adrenal gland: Secondary | ICD-10-CM | POA: Diagnosis present

## 2023-08-02 DIAGNOSIS — J449 Chronic obstructive pulmonary disease, unspecified: Secondary | ICD-10-CM | POA: Diagnosis not present

## 2023-08-02 DIAGNOSIS — Z96651 Presence of right artificial knee joint: Secondary | ICD-10-CM | POA: Diagnosis present

## 2023-08-02 DIAGNOSIS — E785 Hyperlipidemia, unspecified: Secondary | ICD-10-CM | POA: Diagnosis present

## 2023-08-02 DIAGNOSIS — Z888 Allergy status to other drugs, medicaments and biological substances status: Secondary | ICD-10-CM

## 2023-08-02 DIAGNOSIS — Z9049 Acquired absence of other specified parts of digestive tract: Secondary | ICD-10-CM | POA: Diagnosis not present

## 2023-08-02 DIAGNOSIS — Z87891 Personal history of nicotine dependence: Secondary | ICD-10-CM

## 2023-08-02 DIAGNOSIS — Z90710 Acquired absence of both cervix and uterus: Secondary | ICD-10-CM | POA: Diagnosis not present

## 2023-08-02 LAB — CBC
HCT: 32.6 % — ABNORMAL LOW (ref 36.0–46.0)
Hemoglobin: 10.3 g/dL — ABNORMAL LOW (ref 12.0–15.0)
MCH: 27.4 pg (ref 26.0–34.0)
MCHC: 31.6 g/dL (ref 30.0–36.0)
MCV: 86.7 fL (ref 80.0–100.0)
Platelets: 286 10*3/uL (ref 150–400)
RBC: 3.76 MIL/uL — ABNORMAL LOW (ref 3.87–5.11)
RDW: 14.3 % (ref 11.5–15.5)
WBC: 5.3 10*3/uL (ref 4.0–10.5)
nRBC: 0 % (ref 0.0–0.2)

## 2023-08-02 LAB — COMPREHENSIVE METABOLIC PANEL
ALT: 16 U/L (ref 0–44)
AST: 22 U/L (ref 15–41)
Albumin: 4.6 g/dL (ref 3.5–5.0)
Alkaline Phosphatase: 79 U/L (ref 38–126)
Anion gap: 9 (ref 5–15)
BUN: 9 mg/dL (ref 8–23)
CO2: 26 mmol/L (ref 22–32)
Calcium: 9.7 mg/dL (ref 8.9–10.3)
Chloride: 105 mmol/L (ref 98–111)
Creatinine, Ser: 1.1 mg/dL — ABNORMAL HIGH (ref 0.44–1.00)
GFR, Estimated: 50 mL/min — ABNORMAL LOW (ref >60)
Glucose, Bld: 116 mg/dL — ABNORMAL HIGH (ref 70–99)
Potassium: 3 mmol/L — ABNORMAL LOW (ref 3.5–5.1)
Sodium: 140 mmol/L (ref 135–145)
Total Bilirubin: 0.7 mg/dL (ref 0.0–1.2)
Total Protein: 7.7 g/dL (ref 6.5–8.1)

## 2023-08-02 LAB — URINALYSIS, ROUTINE W REFLEX MICROSCOPIC
Bilirubin Urine: NEGATIVE
Glucose, UA: NEGATIVE mg/dL
Hgb urine dipstick: NEGATIVE
Ketones, ur: NEGATIVE mg/dL
Nitrite: NEGATIVE
Protein, ur: 100 mg/dL — AB
Specific Gravity, Urine: 1.017 (ref 1.005–1.030)
pH: 5 (ref 5.0–8.0)

## 2023-08-02 MED ORDER — LOSARTAN POTASSIUM 25 MG PO TABS: 50.0000 mg | ORAL_TABLET | Freq: Every day | ORAL | Status: DC

## 2023-08-02 MED ORDER — HYDRALAZINE HCL 20 MG/ML IJ SOLN
10.0000 mg | INTRAMUSCULAR | Status: DC | PRN
  Administered 2023-08-03: 10 mg via INTRAVENOUS
  Filled 2023-08-02: qty 1

## 2023-08-02 MED ORDER — ACETAMINOPHEN 500 MG PO TABS
1000.0000 mg | ORAL_TABLET | Freq: Two times a day (BID) | ORAL | Status: DC | PRN
  Administered 2023-08-03: 1000 mg via ORAL

## 2023-08-02 MED ORDER — SPIRONOLACTONE 25 MG PO TABS
25.0000 mg | ORAL_TABLET | Freq: Every day | ORAL | Status: DC
  Administered 2023-08-04 – 2023-08-05 (×2): 25 mg via ORAL
  Filled 2023-08-02 (×2): qty 1

## 2023-08-02 MED ORDER — DIPHENHYDRAMINE HCL 12.5 MG/5ML PO ELIX: 12.5000 mg | ORAL_SOLUTION | Freq: Four times a day (QID) | ORAL | Status: DC | PRN

## 2023-08-02 MED ORDER — MELATONIN 3 MG PO TABS: 3.0000 mg | ORAL_TABLET | Freq: Every evening | ORAL | Status: DC | PRN

## 2023-08-02 MED ORDER — SERTRALINE HCL 100 MG PO TABS
200.0000 mg | ORAL_TABLET | Freq: Every day | ORAL | Status: DC
  Administered 2023-08-02 – 2023-08-09 (×7): 200 mg via ORAL
  Filled 2023-08-02 (×7): qty 2

## 2023-08-02 MED ORDER — SIMVASTATIN 40 MG PO TABS
40.0000 mg | ORAL_TABLET | Freq: Every day | ORAL | Status: DC
  Administered 2023-08-02 – 2023-08-08 (×7): 40 mg via ORAL
  Filled 2023-08-02 (×6): qty 1
  Filled 2023-08-02: qty 2

## 2023-08-02 MED ORDER — DIPHENHYDRAMINE HCL 50 MG/ML IJ SOLN: 12.5000 mg | Freq: Four times a day (QID) | INTRAMUSCULAR | Status: DC | PRN

## 2023-08-02 MED ORDER — TRAZODONE HCL 100 MG PO TABS
100.0000 mg | ORAL_TABLET | Freq: Every day | ORAL | Status: DC
  Administered 2023-08-02 – 2023-08-08 (×5): 100 mg via ORAL
  Filled 2023-08-02 (×7): qty 1

## 2023-08-02 MED ORDER — ONDANSETRON HCL 4 MG/2ML IJ SOLN
4.0000 mg | Freq: Four times a day (QID) | INTRAMUSCULAR | Status: DC | PRN
  Administered 2023-08-02 – 2023-08-06 (×4): 4 mg via INTRAVENOUS
  Filled 2023-08-02 (×4): qty 2

## 2023-08-02 MED ORDER — KCL IN DEXTROSE-NACL 20-5-0.45 MEQ/L-%-% IV SOLN
INTRAVENOUS | Status: AC
  Filled 2023-08-02 (×2): qty 1000

## 2023-08-02 MED ORDER — MOMETASONE FURO-FORMOTEROL FUM 200-5 MCG/ACT IN AERO
2.0000 | INHALATION_SPRAY | Freq: Two times a day (BID) | RESPIRATORY_TRACT | Status: DC
  Administered 2023-08-03 – 2023-08-09 (×13): 2 via RESPIRATORY_TRACT
  Filled 2023-08-02: qty 8.8

## 2023-08-02 MED ORDER — ALBUTEROL SULFATE (2.5 MG/3ML) 0.083% IN NEBU: 2.5000 mg | INHALATION_SOLUTION | Freq: Four times a day (QID) | RESPIRATORY_TRACT | Status: DC | PRN

## 2023-08-02 MED ORDER — GABAPENTIN 300 MG PO CAPS: 300.0000 mg | ORAL_CAPSULE | Freq: Every day | ORAL | Status: DC

## 2023-08-02 MED ORDER — MORPHINE SULFATE (PF) 2 MG/ML IV SOLN: 1.0000 mg | INTRAVENOUS | Status: DC | PRN

## 2023-08-02 MED ORDER — SIMETHICONE 80 MG PO CHEW: 40.0000 mg | CHEWABLE_TABLET | Freq: Four times a day (QID) | ORAL | Status: DC | PRN

## 2023-08-02 MED ORDER — ONDANSETRON 4 MG PO TBDP: 4.0000 mg | ORAL_TABLET | Freq: Four times a day (QID) | ORAL | Status: DC | PRN

## 2023-08-02 MED ORDER — ENOXAPARIN SODIUM 40 MG/0.4ML IJ SOSY
40.0000 mg | PREFILLED_SYRINGE | INTRAMUSCULAR | Status: DC
Start: 2023-08-02 — End: 2023-08-09
  Administered 2023-08-02 – 2023-08-08 (×7): 40 mg via SUBCUTANEOUS
  Filled 2023-08-02 (×7): qty 0.4

## 2023-08-02 MED ORDER — TRAMADOL HCL 50 MG PO TABS
50.0000 mg | ORAL_TABLET | Freq: Four times a day (QID) | ORAL | Status: DC | PRN
Start: 2023-08-02 — End: 2023-08-09
  Administered 2023-08-06 – 2023-08-09 (×2): 50 mg via ORAL
  Filled 2023-08-02 (×3): qty 1

## 2023-08-02 MED ORDER — BOOST / RESOURCE BREEZE PO LIQD CUSTOM: 1.0000 | Freq: Three times a day (TID) | ORAL | Status: DC

## 2023-08-02 MED ORDER — HYPROMELLOSE 0.3 % OP GEL: 1.0000 | Freq: Every day | OPHTHALMIC | Status: DC

## 2023-08-02 MED ORDER — CARVEDILOL 12.5 MG PO TABS
12.5000 mg | ORAL_TABLET | Freq: Two times a day (BID) | ORAL | Status: DC
  Administered 2023-08-02 – 2023-08-09 (×13): 12.5 mg via ORAL
  Filled 2023-08-02 (×13): qty 1

## 2023-08-02 NOTE — H&P (Addendum)
H&P  REFERRING PHYSICIAN:  Charna Elizabeth, MD   MRN: [id number] DOB: Oct 09, 1940 DATE OF ENCOUNTER: 08/02/2023   Subjective    Chief Complaint: Colon cancer     History of Present Illness: Adrienne Rogers is a 83 y.o. female with history of HTN, HLD, GERD, whom is seen in the office today as a referral by Dr. Loreta Ave for evaluation of colon cancer, transverse colon.     Colonoscopy 07/27/23 (Dr. Loreta Ave): -Likely malignant near obstructing tumor in the mid ascending colon, biopsied, mucosa 5 cm distal to the mass injected with tattoo ink. - Ascending colon is not visualized - Rest of examination was otherwise normal on direct and retroflexion view   Pathology: Invasive colonic adenocarcinoma, moderately differentiated.   CT A/P 07/22/23: Proximal colonic distention with transition point to decreased caliber 16 cm from the hepatic flexure.  4 cm lesion in the distal transverse colon.  Several lymph nodes in transverse mesocolon up to 8 mm are noted.  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   History of Present Illness Adrienne Rogers is an 83 year old female who presents with bowel obstruction symptoms. She is accompanied by her daughter. She was referred by Dr. Loreta Ave for evaluation of a colon mass found during colonoscopy.   A colonoscopy on January 17th revealed a mass in the transverse colon, which was biopsied and identified as cancerous. A CT scan of the abdomen showed the mass but no other significant findings except for a slightly enlarged adrenal nodule, which will need to be monitored. She has not had any significant bowel movements since the colonoscopy prep.   She presents with symptoms of bowel obstruction, having experienced a change in bowel habits over the past several months. Initially, she managed constipation with laxatives, which were effective until two weeks ago. Since then, she has been on a clear liquid diet and has lost approximately 20 pounds. No nausea or vomiting  since starting the clear liquid diet, but prior to that, she experienced nausea and vomiting, sometimes needing to vomit before having a bowel movement. Currently, she is passing gas and occasionally liquid stool every two to three days, but no solid stools or significant volume.   She denies any history of heart attacks, strokes, or use of blood thinners. 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       PMH: HTN, HLD, GERD   PSH: Tonsillectomy 1952, TAH 1978, laparoscopic cholecystectomy 1996, R knee replacement 2017   FHx: Denies any known family history of colorectal, breast, endometrial or ovarian cancer   Social Hx: Denies use of tobacco/EtOH/illicit drug.  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  She is here today with her daughter whom is an Art gallery manager     Review of Systems: A complete review of systems was obtained from the patient.  I have reviewed this information and discussed as appropriate with the patient.  See HPI as well for other ROS.   All of the below systems have been reviewed with the patient and positives are indicated with bold text General: chills, fever or night sweats Eyes: blurry vision or double vision ENT: epistaxis or sore throat Allergy/Immunology: itchy/watery eyes or nasal congestion Hematologic/Lymphatic: bleeding problems, blood clots or swollen lymph nodes Endocrine: temperature intolerance or unexpected weight changes Breast: new or changing breast lumps or nipple discharge Resp: cough, shortness of breath, or wheezing CV: chest pain or dyspnea on exertion GI: as per  HPI GU: dysuria, trouble voiding, or hematuria MSK: joint pain (chronic) or joint stiffness Neuro: TIA or stroke symptoms Derm: pruritus and skin lesion changes Psych: anxiety and depression     Medical History: Past Medical History      Past Medical History:  Diagnosis Date   Anxiety     Arthritis     History of  cancer     Hypertension     Sleep apnea           Problem List  There is no problem list on file for this patient.      Past Surgical History       Past Surgical History:  Procedure Laterality Date   CHOLECYSTECTOMY       JOINT REPLACEMENT       MYOMECTOMY ABDOMINAL       TONSILLECTOMY            Allergies       Allergies  Allergen Reactions   Others Rash      Uncoded Allergy. Allergen: CILIN DRUGS   Prednisone Unknown   Sulfa (Sulfonamide Antibiotics) Rash        Medications Ordered Prior to Encounter        Current Outpatient Medications on File Prior to Visit  Medication Sig Dispense Refill   carvediloL (COREG) 25 MG tablet Take 12.5 mg by mouth 3 (three) times daily as needed       simvastatin (ZOCOR) 40 MG tablet Take 1 tablet by mouth at bedtime       spironolactone (ALDACTONE) 25 MG tablet Take 1 tablet by mouth once daily       traZODone (DESYREL) 50 MG tablet Take 50 mg by mouth       albuterol (PROVENTIL HFA,VENTOLIN HFA) 90 mcg/actuation inhaler  (Patient not taking: Reported on 08/02/2023)       amLODIPine (NORVASC) 10 MG tablet 0.5 tab by mouth daily (Patient not taking: Reported on 08/02/2023)       amLODIPine (NORVASC) 5 MG tablet  (Patient not taking: Reported on 08/02/2023)       aspirin 81 MG EC tablet daily (Patient not taking: Reported on 08/02/2023)       buPROPion (WELLBUTRIN SR) 100 MG SR tablet 1 tab by mouth daily (Patient not taking: Reported on 08/02/2023)       calcium carbonate 600 mg (1.5 gram) chewable tablet 1 tab by mouth once (Patient not taking: Reported on 08/02/2023)       docusate sodium (COLACE) 100 MG capsule 1 cap by mouth 2 times a day (Patient not taking: Reported on 08/02/2023)       FLUoxetine (PROZAC) 20 MG capsule 1 cap by mouth 3 times a day (Patient not taking: Reported on 08/02/2023)       gabapentin (NEURONTIN) 300 MG capsule 1 ppo hs x 3days, then 1 po BID x 3 days then 1 po tid thereafter (Patient not taking: Reported on  08/02/2023)       hydrocodone-acetaminophen (VICODIN) 5-500 mg tablet  (Patient not taking: Reported on 08/02/2023)       lisinopril-hydrochlorothiazide (PRINZIDE,ZESTORETIC) 20-25 mg tablet 1 tab by mouth daily (Patient not taking: Reported on 08/02/2023)       loratadine (CLARITIN) 10 mg capsule 1 tab by mouth daily as needed (Patient not taking: Reported on 08/02/2023)       meloxicam (MOBIC) 15 MG tablet  (Patient not taking: Reported on 08/02/2023)       methocarbamol (ROBAXIN) 750 MG tablet  (  Patient not taking: Reported on 08/02/2023)       omeprazole (PRILOSEC) 20 MG DR capsule 1 cap by mouth daily (Patient not taking: Reported on 08/02/2023)       oxyCODONE (ROXICODONE) 5 MG immediate release tablet  (Patient not taking: Reported on 08/02/2023)       rosuvastatin (CRESTOR) 20 MG tablet 1 tab by mouth daily (Patient not taking: Reported on 08/02/2023)        No current facility-administered medications on file prior to visit.        Family History       Family History  Problem Relation Age of Onset   Coronary Artery Disease (Blocked arteries around heart) Mother     High blood pressure (Hypertension) Mother     Colon cancer Mother     Stroke Father     High blood pressure (Hypertension) Father     High blood pressure (Hypertension) Sister          Tobacco Use History  Social History        Tobacco Use  Smoking Status Former   Types: Cigarettes  Smokeless Tobacco Never        Social History  Social History         Socioeconomic History   Marital status: Divorced  Tobacco Use   Smoking status: Former      Types: Cigarettes   Smokeless tobacco: Never  Vaping Use   Vaping status: Never Used  Substance and Sexual Activity   Alcohol use: Not Currently   Drug use: Never    Social Drivers of Acupuncturist Strain: Low Risk  (07/21/2021)    Received from [facility]    Overall Financial Resource Strain (CARDIA)     Difficulty of Paying Living  Expenses: Not very hard  Food Insecurity: No Food Insecurity (07/21/2021)    Received from [facility]    Hunger Vital Sign     Worried About Running Out of Food in the Last Year: Never true     Ran Out of Food in the Last Year: Never true  Transportation Needs: No Transportation Needs (07/21/2021)    [facility] - Transportation     Lack of Transportation (Medical): No     Lack of Transportation (Non-Medical): No  Physical Activity: Insufficiently Active (07/21/2021)    Received from [facility]    Exercise Vital Sign     Days of Exercise per Week: 1 day     Minutes of Exercise per Session: 10 min  Stress: Stress Concern Present (07/21/2021)    Received from [facility] - Occupational Stress Questionnaire     Feeling of Stress : To some extent    Received from [facility]    Social Network  Housing Stability: Unknown (08/02/2023)    Housing Stability Vital Sign     Homeless in the Last Year: No        Objective:         Vitals:    08/02/23 1121  BP: 139/84  Pulse: 64  Temp: 36.7 C (98.1 F)  SpO2: 99%  Weight: 77.9 kg (171 lb 12.8 oz)  Height: 163.8 cm (5' 4.5")  PainSc: 0-No pain    Body mass index is 29.03 kg/m.   Constitutional: NAD; conversant Eyes: Moist conjunctiva; anicteric Lungs: Normal respiratory effort CV: RRR; no pitting edema GI: Abdomen  is soft, NT/ND Psychiatric: Appropriate affect     Assessment and Plan:  Diagnoses and all orders for this visit:   Cancer of left colon (CMS/HHS-HCC) -     Cancel: CT chest with and without contrast with 3D MIPS; Future -     Cancel: Ambulatory Referral to Oncology-Medical       Adrienne Rogers is a very pleasant 83 y.o. female with hx of HTN, HLD, GERD here for evaluation of transverse colon cancer, invasive adenocarcinoma, tattooed distally   -CT abdomen/pelvis 07/22/2023: No evidence of metastatic disease in the abdomen or  pelvis -CT chest pending -CEA Assessment & Plan Colon Cancer Newly diagnosed colon cancer with significant symptoms including constipation, weight loss, and inability to tolerate solid foods. CT scan showed no evidence of metastasis but did reveal an adrenal nodule that will require surveillance.   -Admit to hospital for urgent surgical intervention due to severity of symptoms and risk of complete obstruction, significant weight loss, inability to tolerate anything more PO than clear liquids -Will ultimately need CT scan of the chest for complete staging. -Discussed her case with one of my partners, Dr. Fredricka Bonine, who is on-call for our practice at [facility].  We are planning to have her report to the emergency room there for further evaluation, lab work and admission. -We spent time today giving her an overview of the relevant anatomy and physiology of the GI tract and pathophysiology of colorectal cancer.  We discussed this as it pertains to her lesion in the transverse colon. -Discussed with her and her daughter the plan at least from a surgical approach and rational, given symptoms.  Did discuss approach as per surgeon discretion and likely time of surgery decision.  Discussed that working to schedule this as an outpatient is not recommended.   -All of her and her daughters questions were answered to their satisfaction, they expressed understanding, and agreement with the plan.   Adrenal Nodule Slightly enlarged adrenal nodule noted on CT scan. -Will need long-term surveillance - reviewed this with her today as well    Plan to admit and proceed with surgical resection of her colon mass.  CLD today and NPO p MN tomorrow.  Timing of surgery to be determined.  Letha Cape 2:17 PM 08/02/2023    Berna Bue MD FACS 3:10 PM 08/02/2023

## 2023-08-02 NOTE — ED Provider Notes (Signed)
[place] EMERGENCY DEPARTMENT AT [facility] Provider Note   CSN: [id number] Arrival date & time: 08/02/23  1256     History  Chief Complaint  Patient presents with   Abdominal Pain    Adrienne Rogers is a 83 y.o. female patient with past medical history of osteoarthritis, GI bleed, hypertension, hyperlipidemia, newly diagnosed colon cancer presenting to emergency room after being seen at [facility] today by Dr. Cliffton Asters.  Patient had CT scan earlier this month which showed concerns for cancer.  She had colonoscopy 1 week ago and biopsy proved colon cancer.  Patient has lost 20 pounds in 2 and half weeks.  Reports last bowel movement was 1 month ago.  She has not been tolerating eating and drinking as she has immediate nausea and vomiting.  Was sent here for further evaluation metastasis.  Any chest pain, cough, shortness of breath, fevers or chills.   Abdominal Pain      Home Medications Prior to Admission medications   Medication Sig Start Date End Date Taking? Authorizing Provider  acetaminophen (TYLENOL) 500 MG tablet Take 1,000 mg by mouth 2 (two) times daily as needed for moderate pain.    [provider]  albuterol (PROVENTIL HFA;VENTOLIN HFA) 108 (90 Base) MCG/ACT inhaler Inhale 2 puffs into the lungs every 6 (six) hours as needed for wheezing or shortness of breath.     [provider]  aspirin EC 325 MG EC tablet Take 1 tablet (325 mg total) by mouth 2 (two) times daily after a meal. Patient taking differently: Take 81 mg by mouth daily. 03/15/18   [name], PA-C  budesonide-formoterol (SYMBICORT) 160-4.5 MCG/ACT inhaler Inhale 2 puffs into the lungs 2 (two) times daily as needed (shortness of breath).  Patient not taking: Reported on 11/09/2021    [provider]  Calcium Carb-Cholecalciferol 600-10 MG-MCG TABS Take 1 tablet by mouth daily. 01/10/21   [provider]  carvedilol (COREG) 25 MG tablet Take 0.5  tablets (12.5 mg total) by mouth 2 (two) times daily. 11/10/21   [name], MD  Cholecalciferol 25 MCG (1000 UT) tablet Take 1,000 Units by mouth daily. 01/10/21   [provider]  docusate calcium (SURFAK) 240 MG capsule Take 240 mg by mouth daily as needed for mild constipation.     [provider]  fluticasone (FLONASE) 50 MCG/ACT nasal spray Place 2 sprays into both nostrils daily as needed for allergies or rhinitis.    [provider]  gabapentin (NEURONTIN) 300 MG capsule Take 300 mg by mouth at bedtime. 10/06/21   [provider]  hypromellose (GENTEAL) 0.3 % GEL ophthalmic ointment Place 1 application. into both eyes at bedtime. 07/13/21   [provider]  loratadine (CLARITIN) 10 MG tablet Take 10 mg by mouth daily as needed for allergies.    [provider]  losartan (COZAAR) 100 MG tablet Take 50 mg by mouth daily. 11/04/21   [provider]  Multiple Vitamin (MULTIVITAMIN WITH MINERALS) TABS tablet Take 1 tablet by mouth daily.    [provider]  pantoprazole (PROTONIX) 40 MG tablet Take 1 tablet (40 mg total) by mouth daily. 11/10/21 12/10/21  [name], MD  sennosides-docusate sodium (SENOKOT-S) 8.6-50 MG tablet Take 1 tablet by mouth daily as needed for constipation.    [provider]  sertraline (ZOLOFT) 100 MG tablet Take 50 mg by mouth daily. 10/02/21   [provider]  simvastatin (ZOCOR) 40 MG tablet  Take 40 mg by mouth at bedtime.     [provider]  sodium chloride (OCEAN) 0.65 % SOLN nasal spray Place 1 spray into both nostrils as needed for congestion.    [provider]  spironolactone (ALDACTONE) 25 MG tablet Take 25 mg by mouth daily. 09/20/21   [provider]  sucralfate (CARAFATE) 1 g tablet Take 1 tablet (1 g total) by mouth 4 (four) times daily -  with meals and at bedtime. 06/20/23   [name], MD  traZODone (DESYREL) 150 MG tablet Take 75 mg by  mouth at bedtime.    [provider]  Monte Fantasia INHUB 250-50 MCG/ACT AEPB Inhale 1 puff into the lungs 2 (two) times daily. 09/19/21   [provider]      Allergies    Lisinopril, Ezetimibe-simvastatin, Fosamax  [alendronate sodium], Sulfa antibiotics, Bupropion, Doxepin, Prazosin, Tetracyclines & related, and Egg-derived products    Review of Systems   Review of Systems  Gastrointestinal:  Positive for abdominal pain.    Physical Exam Updated Vital Signs BP (!) 152/70   Pulse 63   Temp 97.6 F (36.4 C) (Oral)   Resp 18   Ht 5\' 5"  (1.651 m)   Wt 77.1 kg   SpO2 100%   BMI 28.29 kg/m  Physical Exam Vitals and nursing note reviewed.  Constitutional:      General: She is not in acute distress.    Appearance: She is not toxic-appearing.  HENT:     Head: Normocephalic and atraumatic.  Eyes:     General: No scleral icterus.    Conjunctiva/sclera: Conjunctivae normal.  Cardiovascular:     Rate and Rhythm: Normal rate and regular rhythm.     Pulses: Normal pulses.     Heart sounds: Normal heart sounds.  Pulmonary:     Effort: Pulmonary effort is normal. No respiratory distress.     Breath sounds: Normal breath sounds.  Abdominal:     General: Abdomen is flat. Bowel sounds are normal. There is no distension.     Palpations: Abdomen is soft. There is no mass.     Tenderness: There is no abdominal tenderness.  Musculoskeletal:     Right lower leg: No edema.     Left lower leg: No edema.  Skin:    General: Skin is warm and dry.     Findings: No lesion.  Neurological:     General: No focal deficit present.     Mental Status: She is alert and oriented to person, place, and time. Mental status is at baseline.     ED Results / Procedures / Treatments   Labs (all labs ordered are listed, but only abnormal results are displayed) Labs Reviewed  CBC - Abnormal; Notable for the following components:      Result Value   RBC 3.76 (*)    Hemoglobin 10.3 (*)     HCT 32.6 (*)    All other components within normal limits  URINALYSIS, ROUTINE W REFLEX MICROSCOPIC - Abnormal; Notable for the following components:   Color, Urine AMBER (*)    APPearance CLOUDY (*)    Protein, ur 100 (*)    Leukocytes,Ua TRACE (*)    Bacteria, UA MANY (*)    All other components within normal limits  COMPREHENSIVE METABOLIC PANEL  CEA    EKG None  Radiology CT CHEST WO CONTRAST Result Date: 08/02/2023 CLINICAL DATA:  Colon cancer, assess treatment response. * Tracking Code: BO * EXAM: CT CHEST  WITHOUT CONTRAST TECHNIQUE: Multidetector CT imaging of the chest was performed following the standard protocol without IV contrast. RADIATION DOSE REDUCTION: This exam was performed according to the departmental dose-optimization program which includes automated exposure control, adjustment of the mA and/or kV according to patient size and/or use of iterative reconstruction technique. COMPARISON:  Chest CT 01/21/2018.  Abdominal CT 07/22/2023. FINDINGS: Cardiovascular: Mild aortic and coronary artery atherosclerosis. The heart size is normal. There is a small amount of pericardial fluid or thickening anteriorly. Mediastinum/Nodes: There are no enlarged mediastinal, hilar or axillary lymph nodes.Small hiatal hernia. The thyroid gland and trachea appear unremarkable. Lungs/Pleura: No pleural effusion or pneumothorax. Mild linear atelectasis or scarring at both lung bases. No confluent airspace disease or suspicious pulmonary nodule. Upper abdomen: No acute findings are seen in the visualized upper abdomen. As seen on the recent abdominal CT, and incompletely visualized on remote chest CTA, there is a left adrenal nodule measuring 2.1 x 1.1 cm on image 57/2. This has a nonspecific density of the 22 HU. Possible punctate nonobstructing calculus in the upper pole of the right kidney. Musculoskeletal/Chest wall: There is no chest wall mass or suspicious osseous finding. Mild spondylosis.  IMPRESSION: 1. No evidence of metastatic disease in the chest. 2. Indeterminate left adrenal nodule, unchanged from recent abdominal CT and incompletely visualized on remote chest CTA. Consider further evaluation with adrenal protocol CT or MRI. 3.  Aortic Atherosclerosis (ICD10-I70.0). Electronically Signed   By: Carey Bullocks M.D.   On: 08/02/2023 15:05    Procedures Procedures    Medications Ordered in ED Medications - No data to display  ED Course/ Medical Decision Making/ A&P Clinical Course as of 08/02/23 1520  Tue Aug 02, 2023  1422 Barnetta Chapel PA-c with gen surg to admit patient.  [JB]    Clinical Course User Index [JB] Pete Merten, Horald Chestnut, PA-C                                 Medical Decision Making Amount and/or Complexity of Data Reviewed Labs: ordered.  Risk Decision regarding hospitalization.   [name] 83 y.o. presented today for abd pain. Working DDx includes, but not limited to, gastroenteritis, colitis, SBO, appendicitis, cholecystitis, hepatobiliary pathology, gastritis, PUD, ACS, dissection, pancreatitis, nephrolithiasis, AAA, UTI, pyelonephritis  R/o DDx: These are considered less likely than current impression due to history of present illness, physical exam, labs/imaging findings.   Pmhx: colon cancer  Unique Tests and My Interpretation:  CBC without leukocytosis hemoglobin stable at 10.3 CMP is pending   Imaging:  Discussed ordering CT abdomen with Barnetta Chapel, PA-C who reports they do not need repeat imaging.  She is ordering CT chest without contrast to evaluate for metastasis.  Problem List / ED Course / Critical interventions / Medication management  Patient presented to emergency room with abdominal pain nausea vomiting diarrhea and weight loss.  Patient has history of cancer and was sent here from [facility].  Patient hemodynamically stable well-appearing.  Discussed the need for imaging with general surgery  team.  They agreed to place orders including labs, imaging and admission orders.  Patient is stable for admit. Patients vitals assessed. Upon arrival patient is hemodynamically stable.  I have reviewed the patients home medicines and have made adjustments as needed     Plan:  Gen Surgery to admit          Final Clinical Impression(s) /  ED Diagnoses Final diagnoses:  Malignant neoplasm of transverse colon [facility])    Rx / DC Orders ED Discharge Orders     None         Raford Pitcher Evalee Jefferson 08/02/23 1525    Terrilee Files, MD 08/02/23 1729

## 2023-08-02 NOTE — ED Triage Notes (Signed)
Pt here sent by surgeon for  SBO from [place] , pt just had a scan and colonoscopy this past Wed , pt has lost 20lbs in 2.5 weeks

## 2023-08-02 NOTE — ED Provider Triage Note (Signed)
Emergency Medicine Provider Triage Evaluation Note  Adrienne Rogers , a 83 y.o. female  was evaluated in triage.  Pt complains of abdominal pain, no BM x1 week, decreased appetite, NV.  Review of Systems  Positive: Abdominal pain Negative: Cp, SOB  Physical Exam  BP (!) 152/70   Pulse 63   Temp 97.6 F (36.4 C) (Oral)   Resp 18   Ht 5\' 5"  (1.651 m)   Wt 77.1 kg   SpO2 100%   BMI 28.29 kg/m  Gen:   Awake, no distress   Resp:  Normal effort  MSK:   Moves extremities without difficulty  Other:  Abdominal pain   Medical Decision Making  Medically screening exam initiated at 1:41 PM.  Appropriate orders placed.  Adrienne Rogers was informed that the remainder of the evaluation will be completed by another provider, this initial triage assessment does not replace that evaluation, and the importance of remaining in the ED until their evaluation is complete.  Adenocarcinoma of colon. Seen at [facility] with [facility] [place] [facility]. Sent to ED due to bowel obstruction.    Adrienne Knudsen, PA-C 08/02/23 1342

## 2023-08-03 ENCOUNTER — Other Ambulatory Visit: Payer: Self-pay

## 2023-08-03 ENCOUNTER — Inpatient Hospital Stay (HOSPITAL_COMMUNITY): Payer: Non-veteran care | Admitting: Anesthesiology

## 2023-08-03 ENCOUNTER — Encounter (HOSPITAL_COMMUNITY): Payer: Self-pay

## 2023-08-03 ENCOUNTER — Encounter (HOSPITAL_COMMUNITY): Admission: EM | Disposition: A | Discharge status: Home Health | Payer: Self-pay | Source: Ambulatory Visit

## 2023-08-03 DIAGNOSIS — C189 Malignant neoplasm of colon, unspecified: Secondary | ICD-10-CM

## 2023-08-03 DIAGNOSIS — I10 Essential (primary) hypertension: Secondary | ICD-10-CM | POA: Diagnosis not present

## 2023-08-03 DIAGNOSIS — J449 Chronic obstructive pulmonary disease, unspecified: Secondary | ICD-10-CM

## 2023-08-03 DIAGNOSIS — F418 Other specified anxiety disorders: Secondary | ICD-10-CM

## 2023-08-03 HISTORY — PX: PARTIAL COLECTOMY: SHX5273

## 2023-08-03 LAB — BASIC METABOLIC PANEL
Anion gap: 7 (ref 5–15)
BUN: 7 mg/dL — ABNORMAL LOW (ref 8–23)
CO2: 24 mmol/L (ref 22–32)
Calcium: 9 mg/dL (ref 8.9–10.3)
Chloride: 111 mmol/L (ref 98–111)
Creatinine, Ser: 1.1 mg/dL — ABNORMAL HIGH (ref 0.44–1.00)
GFR, Estimated: 50 mL/min — ABNORMAL LOW (ref >60)
Glucose, Bld: 109 mg/dL — ABNORMAL HIGH (ref 70–99)
Potassium: 3.2 mmol/L — ABNORMAL LOW (ref 3.5–5.1)
Sodium: 142 mmol/L (ref 135–145)

## 2023-08-03 LAB — SURGICAL PCR SCREEN
MRSA, PCR: NEGATIVE
Staphylococcus aureus: NEGATIVE

## 2023-08-03 LAB — CBC
HCT: 30.3 % — ABNORMAL LOW (ref 36.0–46.0)
Hemoglobin: 9.5 g/dL — ABNORMAL LOW (ref 12.0–15.0)
MCH: 27.3 pg (ref 26.0–34.0)
MCHC: 31.4 g/dL (ref 30.0–36.0)
MCV: 87.1 fL (ref 80.0–100.0)
Platelets: 254 10*3/uL (ref 150–400)
RBC: 3.48 MIL/uL — ABNORMAL LOW (ref 3.87–5.11)
RDW: 14.3 % (ref 11.5–15.5)
WBC: 4.7 10*3/uL (ref 4.0–10.5)
nRBC: 0 % (ref 0.0–0.2)

## 2023-08-03 LAB — TYPE AND SCREEN
ABO/RH(D): A POS
Antibody Screen: NEGATIVE

## 2023-08-03 LAB — CEA: CEA: 42.7 ng/mL — ABNORMAL HIGH (ref 0.0–4.7)

## 2023-08-03 SURGERY — COLECTOMY, PARTIAL
Anesthesia: General | Site: Abdomen

## 2023-08-03 MED ORDER — DROPERIDOL 2.5 MG/ML IJ SOLN: 0.6250 mg | Freq: Once | INTRAMUSCULAR | Status: DC | PRN

## 2023-08-03 MED ORDER — ACETAMINOPHEN 160 MG/5ML PO SOLN: 325.0000 mg | Freq: Once | ORAL | Status: DC | PRN

## 2023-08-03 MED ORDER — MEPERIDINE HCL 50 MG/ML IJ SOLN: 6.2500 mg | INTRAMUSCULAR | Status: DC | PRN

## 2023-08-03 MED ORDER — ROCURONIUM BROMIDE 100 MG/10ML IV SOLN
INTRAVENOUS | Status: DC | PRN
  Administered 2023-08-03: 50 mg via INTRAVENOUS

## 2023-08-03 MED ORDER — FENTANYL CITRATE (PF) 100 MCG/2ML IJ SOLN
INTRAMUSCULAR | Status: AC
  Filled 2023-08-03: qty 2

## 2023-08-03 MED ORDER — METHOCARBAMOL 1000 MG/10ML IJ SOLN: 500.0000 mg | Freq: Four times a day (QID) | INTRAMUSCULAR | Status: DC | PRN

## 2023-08-03 MED ORDER — ONDANSETRON HCL 4 MG/2ML IJ SOLN
INTRAMUSCULAR | Status: DC | PRN
  Administered 2023-08-03: 4 mg via INTRAVENOUS

## 2023-08-03 MED ORDER — ACETAMINOPHEN 325 MG PO TABS: 325.0000 mg | ORAL_TABLET | Freq: Once | ORAL | Status: DC | PRN

## 2023-08-03 MED ORDER — DEXAMETHASONE SODIUM PHOSPHATE 10 MG/ML IJ SOLN
INTRAMUSCULAR | Status: AC
  Filled 2023-08-03: qty 1

## 2023-08-03 MED ORDER — OXYCODONE HCL 5 MG PO TABS
5.0000 mg | ORAL_TABLET | ORAL | Status: DC | PRN
  Administered 2023-08-03 – 2023-08-07 (×6): 5 mg via ORAL
  Filled 2023-08-03 (×6): qty 1

## 2023-08-03 MED ORDER — PROPOFOL 10 MG/ML IV BOLUS
INTRAVENOUS | Status: AC
  Filled 2023-08-03: qty 20

## 2023-08-03 MED ORDER — ACETAMINOPHEN 10 MG/ML IV SOLN: 1000.0000 mg | Freq: Once | INTRAVENOUS | Status: DC | PRN

## 2023-08-03 MED ORDER — CEFAZOLIN SODIUM-DEXTROSE 2-3 GM-%(50ML) IV SOLR
INTRAVENOUS | Status: DC | PRN
  Administered 2023-08-03: 2 g via INTRAVENOUS

## 2023-08-03 MED ORDER — METRONIDAZOLE 500 MG/100ML IV SOLN
INTRAVENOUS | Status: DC | PRN
  Administered 2023-08-03: 500 mg via INTRAVENOUS

## 2023-08-03 MED ORDER — LABETALOL HCL 5 MG/ML IV SOLN
INTRAVENOUS | Status: DC | PRN
  Administered 2023-08-03 (×2): 10 mg via INTRAVENOUS

## 2023-08-03 MED ORDER — CEFAZOLIN SODIUM 1 G IJ SOLR
INTRAMUSCULAR | Status: AC
  Filled 2023-08-03: qty 20

## 2023-08-03 MED ORDER — ONDANSETRON HCL 4 MG/2ML IJ SOLN
INTRAMUSCULAR | Status: AC
  Filled 2023-08-03: qty 2

## 2023-08-03 MED ORDER — PHENYLEPHRINE 80 MCG/ML (10ML) SYRINGE FOR IV PUSH (FOR BLOOD PRESSURE SUPPORT)
PREFILLED_SYRINGE | INTRAVENOUS | Status: AC
  Filled 2023-08-03: qty 10

## 2023-08-03 MED ORDER — LIDOCAINE HCL (CARDIAC) PF 100 MG/5ML IV SOSY
PREFILLED_SYRINGE | INTRAVENOUS | Status: DC | PRN
  Administered 2023-08-03: 80 mg via INTRAVENOUS

## 2023-08-03 MED ORDER — ACETAMINOPHEN 500 MG PO TABS
1000.0000 mg | ORAL_TABLET | Freq: Four times a day (QID) | ORAL | Status: DC
  Administered 2023-08-03 – 2023-08-09 (×19): 1000 mg via ORAL
  Filled 2023-08-03 (×21): qty 2

## 2023-08-03 MED ORDER — LACTATED RINGERS IV SOLN: INTRAVENOUS | Status: DC | PRN

## 2023-08-03 MED ORDER — LACTATED RINGERS IV SOLN: INTRAVENOUS | Status: DC

## 2023-08-03 MED ORDER — HYDROMORPHONE HCL 1 MG/ML IJ SOLN
0.5000 mg | INTRAMUSCULAR | Status: DC | PRN
Start: 2023-08-03 — End: 2023-08-06
  Administered 2023-08-03: 0.5 mg via INTRAVENOUS
  Administered 2023-08-05: 1 mg via INTRAVENOUS
  Filled 2023-08-03 (×3): qty 1

## 2023-08-03 MED ORDER — CHLORHEXIDINE GLUCONATE 0.12 % MT SOLN
15.0000 mL | Freq: Once | OROMUCOSAL | Status: AC
  Administered 2023-08-03: 15 mL via OROMUCOSAL

## 2023-08-03 MED ORDER — EPHEDRINE SULFATE (PRESSORS) 50 MG/ML IJ SOLN
INTRAMUSCULAR | Status: DC | PRN
  Administered 2023-08-03 (×2): 5 mg via INTRAVENOUS

## 2023-08-03 MED ORDER — PROPOFOL 10 MG/ML IV BOLUS
INTRAVENOUS | Status: DC | PRN
  Administered 2023-08-03: 150 mg via INTRAVENOUS

## 2023-08-03 MED ORDER — KETAMINE HCL 10 MG/ML IJ SOLN
INTRAMUSCULAR | Status: DC | PRN
  Administered 2023-08-03: 20 mg via INTRAVENOUS

## 2023-08-03 MED ORDER — DEXAMETHASONE SODIUM PHOSPHATE 4 MG/ML IJ SOLN
INTRAMUSCULAR | Status: DC | PRN
  Administered 2023-08-03: 5 mg via INTRAVENOUS

## 2023-08-03 MED ORDER — LIDOCAINE HCL (PF) 2 % IJ SOLN
INTRAMUSCULAR | Status: AC
  Filled 2023-08-03: qty 15

## 2023-08-03 MED ORDER — HYDROMORPHONE HCL 1 MG/ML IJ SOLN: 0.2500 mg | INTRAMUSCULAR | Status: DC | PRN

## 2023-08-03 MED ORDER — 0.9 % SODIUM CHLORIDE (POUR BTL) OPTIME
TOPICAL | Status: DC | PRN
  Administered 2023-08-03 (×2): 1000 mL

## 2023-08-03 MED ORDER — EPHEDRINE 5 MG/ML INJ
INTRAVENOUS | Status: AC
  Filled 2023-08-03: qty 10

## 2023-08-03 MED ORDER — ORAL CARE MOUTH RINSE: 15.0000 mL | Freq: Once | OROMUCOSAL | Status: AC

## 2023-08-03 MED ORDER — METRONIDAZOLE 500 MG/100ML IV SOLN
INTRAVENOUS | Status: AC
  Filled 2023-08-03: qty 100

## 2023-08-03 MED ORDER — FENTANYL CITRATE (PF) 100 MCG/2ML IJ SOLN
INTRAMUSCULAR | Status: DC | PRN
  Administered 2023-08-03 (×3): 50 ug via INTRAVENOUS

## 2023-08-03 MED ORDER — KETAMINE HCL 50 MG/5ML IJ SOSY
PREFILLED_SYRINGE | INTRAMUSCULAR | Status: AC
  Filled 2023-08-03: qty 5

## 2023-08-03 MED ORDER — SUGAMMADEX SODIUM 200 MG/2ML IV SOLN
INTRAVENOUS | Status: DC | PRN
  Administered 2023-08-03: 200 mg via INTRAVENOUS

## 2023-08-03 MED ORDER — BOOST / RESOURCE BREEZE PO LIQD CUSTOM
1.0000 | Freq: Three times a day (TID) | ORAL | Status: DC
  Administered 2023-08-04 – 2023-08-09 (×12): 1 via ORAL

## 2023-08-03 MED ORDER — CHLORHEXIDINE GLUCONATE 0.12 % MT SOLN: 15.0000 mL | Freq: Once | OROMUCOSAL | Status: DC

## 2023-08-03 MED ORDER — LACTATED RINGERS IV SOLN: INTRAVENOUS | Status: AC

## 2023-08-03 SURGICAL SUPPLY — 43 items
BAG COUNTER SPONGE SURGICOUNT (BAG) IMPLANT
CELLS DAT CNTRL 66122 CELL SVR (MISCELLANEOUS) IMPLANT
CHLORAPREP W/TINT 26 (MISCELLANEOUS) IMPLANT
COVER SURGICAL LIGHT HANDLE (MISCELLANEOUS) ×4 IMPLANT
DRAIN CHANNEL 19F RND (DRAIN) IMPLANT
DRSG OPSITE POSTOP 4X10 (GAUZE/BANDAGES/DRESSINGS) IMPLANT
DRSG OPSITE POSTOP 4X6 (GAUZE/BANDAGES/DRESSINGS) IMPLANT
DRSG OPSITE POSTOP 4X8 (GAUZE/BANDAGES/DRESSINGS) IMPLANT
ELECT PENCIL ROCKER SW 15FT (MISCELLANEOUS) ×2 IMPLANT
ELECT REM PT RETURN 15FT ADLT (MISCELLANEOUS) ×2 IMPLANT
EVACUATOR SILICONE 100CC (DRAIN) IMPLANT
GLOVE BIO SURGEON STRL SZ 6 (GLOVE) ×4 IMPLANT
GLOVE INDICATOR 6.5 STRL GRN (GLOVE) ×4 IMPLANT
GOWN STRL REUS W/ TWL LRG LVL3 (GOWN DISPOSABLE) ×2 IMPLANT
GOWN STRL REUS W/ TWL XL LVL3 (GOWN DISPOSABLE) IMPLANT
KIT TURNOVER KIT A (KITS) IMPLANT
LIGASURE IMPACT 36 18CM CVD LR (INSTRUMENTS) IMPLANT
PACK COLON (CUSTOM PROCEDURE TRAY) ×2 IMPLANT
RELOAD PROXIMATE 75MM BLUE (ENDOMECHANICALS) ×2 IMPLANT
RELOAD STAPLE 75 3.8 BLU REG (ENDOMECHANICALS) IMPLANT
RETRACTOR WND ALEXIS 18 MED (MISCELLANEOUS) IMPLANT
RETRACTOR WND ALEXIS 25 LRG (MISCELLANEOUS) IMPLANT
RETRACTOR WOUND ALXS 25CM LRG (MISCELLANEOUS) ×1 IMPLANT
RETRACTOR WOUND ALXS 34CM XLRG (MISCELLANEOUS) IMPLANT
RTRCTR WOUND ALEXIS 18CM MED (MISCELLANEOUS) IMPLANT
RTRCTR WOUND ALEXIS 25CM LRG (MISCELLANEOUS) ×1 IMPLANT
RTRCTR WOUND ALEXIS 34CM XLRG (MISCELLANEOUS) IMPLANT
SPONGE T-LAP 18X18 ~~LOC~~+RFID (SPONGE) IMPLANT
STAPLER GUN LINEAR PROX 60 (STAPLE) IMPLANT
STAPLER PROXIMATE 75MM BLUE (STAPLE) IMPLANT
STAPLER SKIN PROX WIDE 3.9 (STAPLE) IMPLANT
SUT ETHILON 2 0 PS N (SUTURE) IMPLANT
SUT MNCRL AB 4-0 PS2 18 (SUTURE) IMPLANT
SUT PDS AB 1 TP1 96 (SUTURE) ×4 IMPLANT
SUT SILK 2 0 SH CR/8 (SUTURE) ×2 IMPLANT
SUT SILK 2-0 18XBRD TIE 12 (SUTURE) ×2 IMPLANT
SUT SILK 3 0 SH CR/8 (SUTURE) ×2 IMPLANT
SUT SILK 3-0 18XBRD TIE 12 (SUTURE) ×2 IMPLANT
TOWEL GREEN STERILE FF (TOWEL DISPOSABLE) ×2 IMPLANT
TOWEL OR 17X26 10 PK STRL BLUE (TOWEL DISPOSABLE) IMPLANT
TRAY FOLEY MTR SLVR 14FR STAT (SET/KITS/TRAYS/PACK) ×2 IMPLANT
TRAY FOLEY MTR SLVR 16FR STAT (SET/KITS/TRAYS/PACK) ×2 IMPLANT
TUBING CONNECTING 10 (TUBING) ×4 IMPLANT

## 2023-08-03 NOTE — Anesthesia Procedure Notes (Signed)
Procedure Name: Intubation Date/Time: 08/03/2023 11:33 AM  Performed by: Deri Fuelling, CRNAPre-anesthesia Checklist: Patient identified, Emergency Drugs available, Suction available and Patient being monitored Patient Re-evaluated:Patient Re-evaluated prior to induction Oxygen Delivery Method: Circle system utilized Preoxygenation: Pre-oxygenation with 100% oxygen Induction Type: IV induction Ventilation: Mask ventilation without difficulty Laryngoscope Size: Miller and 2 Grade View: Grade II Tube type: Oral Tube size: 7.0 mm Number of attempts: 1 Airway Equipment and Method: Stylet and Oral airway Placement Confirmation: ETT inserted through vocal cords under direct vision, positive ETCO2 and breath sounds checked- equal and bilateral Secured at: 21 cm Tube secured with: Tape Dental Injury: Teeth and Oropharynx as per pre-operative assessment

## 2023-08-03 NOTE — Anesthesia Preprocedure Evaluation (Addendum)
Anesthesia Evaluation  Patient identified by MRN, date of birth, ID band Patient awake    Reviewed: Allergy & Precautions, NPO status , Patient's Chart, lab work & pertinent test results  Airway Mallampati: III  TM Distance: >3 FB Neck ROM: Full    Dental  (+) Teeth Intact, Dental Advisory Given   Pulmonary asthma , COPD   breath sounds clear to auscultation       Cardiovascular hypertension, Pt. on home beta blockers and Pt. on medications  Rhythm:Regular Rate:Normal     Neuro/Psych  PSYCHIATRIC DISORDERS Anxiety Depression     Neuromuscular disease    GI/Hepatic negative GI ROS, Neg liver ROS,,,  Endo/Other  negative endocrine ROS    Renal/GU negative Renal ROS     Musculoskeletal  (+) Arthritis ,    Abdominal   Peds  Hematology negative hematology ROS (+)   Anesthesia Other Findings   Reproductive/Obstetrics                             Anesthesia Physical Anesthesia Plan  ASA: 3  Anesthesia Plan: General   Post-op Pain Management: Tylenol PO (pre-op)*   Induction: Intravenous  PONV Risk Score and Plan: 4 or greater and Ondansetron and Treatment may vary due to age or medical condition  Airway Management Planned: Oral ETT  Additional Equipment: None  Intra-op Plan:   Post-operative Plan: Extubation in OR  Informed Consent: I have reviewed the patients History and Physical, chart, labs and discussed the procedure including the risks, benefits and alternatives for the proposed anesthesia with the patient or authorized representative who has indicated his/her understanding and acceptance.     Dental advisory given  Plan Discussed with: CRNA  Anesthesia Plan Comments:        Anesthesia Quick Evaluation

## 2023-08-03 NOTE — Transfer of Care (Signed)
Immediate Anesthesia Transfer of Care Note  Patient: Adrienne Rogers  Procedure(s) Performed: OPEN PARTIAL COLECTOMY (Abdomen)  Patient Location: PACU  Anesthesia Type:General  Level of Consciousness: alert  and oriented  Airway & Oxygen Therapy: Patient Spontanous Breathing and Patient connected to face mask oxygen  Post-op Assessment: Report given to RN and Post -op Vital signs reviewed and stable  Post vital signs: Reviewed and stable  Last Vitals:  Vitals Value Taken Time  BP 190/80 08/03/23 1327  Temp    Pulse 31 08/03/23 1328  Resp 19 08/03/23 1328  SpO2 95 % 08/03/23 1328  Vitals shown include unfiled device data.  Last Pain:  Vitals:   08/03/23 0957  TempSrc:   PainSc: 0-No pain         Complications: No notable events documented.

## 2023-08-03 NOTE — Progress Notes (Signed)
   Subjective/Chief Complaint: Feeling OK this morning   Objective: Vital signs in last 24 hours: Temp:  [97.6 F (36.4 C)-98.8 F (37.1 C)] 98.7 F (37.1 C) (01/29 0611) Pulse Rate:  [58-67] 59 (01/29 0611) Resp:  [16-18] 16 (01/29 0611) BP: (125-179)/(57-86) 142/64 (01/29 0611) SpO2:  [95 %-100 %] 95 % (01/29 0832) Weight:  [77.1 kg] 77.1 kg (01/28 1325) Last BM Date : 08/02/23  Intake/Output from previous day: 01/28 0701 - 01/29 0700 In: 1225.5 [P.O.:210; I.V.:1015.5] Out: -  Intake/Output this shift: No intake/output data recorded.  A&Ox3 Unlabored respirations Abd s/nt/nd  Lab Results:  Recent Labs    08/02/23 1450 08/03/23 0314  WBC 5.3 4.7  HGB 10.3* 9.5*  HCT 32.6* 30.3*  PLT 286 254   BMET Recent Labs    08/02/23 1450  NA 140  K 3.0*  CL 105  CO2 26  GLUCOSE 116*  BUN 9  CREATININE 1.10*  CALCIUM 9.7   PT/INR No results for input(s): "LABPROT", "INR" in the last 72 hours. ABG No results for input(s): "PHART", "HCO3" in the last 72 hours.  Invalid input(s): "PCO2", "PO2"  Studies/Results: CT CHEST WO CONTRAST Result Date: 08/02/2023 CLINICAL DATA:  Colon cancer, assess treatment response. * Tracking Code: BO * EXAM: CT CHEST WITHOUT CONTRAST TECHNIQUE: Multidetector CT imaging of the chest was performed following the standard protocol without IV contrast. RADIATION DOSE REDUCTION: This exam was performed according to the departmental dose-optimization program which includes automated exposure control, adjustment of the mA and/or kV according to patient size and/or use of iterative reconstruction technique. COMPARISON:  Chest CT 01/21/2018.  Abdominal CT 07/22/2023. FINDINGS: Cardiovascular: Mild aortic and coronary artery atherosclerosis. The heart size is normal. There is a small amount of pericardial fluid or thickening anteriorly. Mediastinum/Nodes: There are no enlarged mediastinal, hilar or axillary lymph nodes.Small hiatal hernia. The  thyroid gland and trachea appear unremarkable. Lungs/Pleura: No pleural effusion or pneumothorax. Mild linear atelectasis or scarring at both lung bases. No confluent airspace disease or suspicious pulmonary nodule. Upper abdomen: No acute findings are seen in the visualized upper abdomen. As seen on the recent abdominal CT, and incompletely visualized on remote chest CTA, there is a left adrenal nodule measuring 2.1 x 1.1 cm on image 57/2. This has a nonspecific density of the 22 HU. Possible punctate nonobstructing calculus in the upper pole of the right kidney. Musculoskeletal/Chest wall: There is no chest wall mass or suspicious osseous finding. Mild spondylosis. IMPRESSION: 1. No evidence of metastatic disease in the chest. 2. Indeterminate left adrenal nodule, unchanged from recent abdominal CT and incompletely visualized on remote chest CTA. Consider further evaluation with adrenal protocol CT or MRI. 3.  Aortic Atherosclerosis (ICD10-I70.0). Electronically Signed   By: Carey Bullocks M.D.   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 We discussed the surgical plan and risks including bleeding, infection, pain, scarring, injury to intraabdominal structures, anastomatic leak/bleed/stricture; ileus, obstruction, wound healing problems/hernia, as well as cardiovascular/ pulmonary/ thromboembolic risks. Questions welcomed and answered to her satisfaction. Will proceed to OR today.    LOS: 1 day    Berna Bue 08/03/2023

## 2023-08-03 NOTE — Plan of Care (Signed)
  Problem: Activity: Goal: Risk for activity intolerance will decrease Outcome: Progressing   Problem: Elimination: Goal: Will not experience complications related to bowel motility Outcome: Completed/Met   Problem: Skin Integrity: Goal: Demonstration of wound healing without infection will improve Outcome: Completed/Met   Problem: Clinical Measurements: Goal: Ability to maintain clinical measurements within normal limits will improve Outcome: Progressing Goal: Postoperative complications will be avoided or minimized Outcome: Progressing

## 2023-08-03 NOTE — Op Note (Signed)
Operative Note  Adrienne Rogers  [id number]  [id number]  08/03/2023   Surgeon: Phylliss Blakes MD FACS   Procedure performed: Exploratory laparotomy, segmental transverse colectomy with colocolonic anastomosis   Preop diagnosis: Obstructing transverse colon adenocarcinoma Post-op diagnosis/intraop findings: same   Specimens: Segment of transverse colon, stitch marks proximal Retained items: No EBL: Minimal cc Complications: none   Description of procedure: After confirming informed consent the patient was taken to the operating room and placed supine on operating room table where general endotracheal anesthesia was initiated, preoperative antibiotics were administered, SCDs applied, and a formal timeout was performed.  Foley catheter was inserted which is removed at the end of the case.  The abdomen was prepped and draped in usual sterile fashion.  A small midline laparotomy was created.  There were dense omental adhesions to the lower midline which were carefully lysed with a combination of cautery and blunt dissection.  Once we had completed the incision, an Alexis wound protector was placed.  There were additional adhesions between the transverse colon omentum and the right lower quadrant which were carefully divided.  After this we were able to eviscerate the transverse colon into the field.  A palpable mass was noted as expected in the central aspect of the transverse colon.  There was no palpable mass in the ascending or descending or sigmoid colon, and no palpable lesions along the liver surface or peritoneum.  The hepatic flexure was mobilized with a combination of blunt dissection and cautery to ultimately create a tension-free anastomosis.  A site was chosen about 10 cm proximal to the lesion in the transverse colon and the colon was divided here with a blue load 75 mm Endo GIA stapler.  In a similar fashion the distal transverse colon was divided about 10 cm distal to the lesion.  The  intervening mesentery was divided with the LigaSure and the specimen was handed off.  Hemostasis was ensured in the field.  The proximal and distal transverse colon ends were aligned and enterotomies created with the cautery.  A side-to-side functional end-to-end colocolonic anastomosis was created with a 75 mm blue load Endo GIA stapler.  The common enterotomy was closed with a TX 60 blue load.  The ends of this latter staple line were imbricated with seromuscular 3-0 silk sutures.  An additional 3-0 silk suture was placed at the apex of the anastomotic staple line to avoid tension on this area.  On completion the anastomosis is palpably patent, appears to be well-perfused and free of tension.  This was reduced into the abdomen and omentum was brought over this.  The abdomen was irrigated with warm sterile saline and the effluent was clear.  Hemostasis was once again ensured.  At this point the clean dirty protocol was followed and all gowns, gloves, instruments and drapes were exchanged for clean wounds.  The fascia was closed with a running looped #1 PDS starting at either end and tying centrally.  Hemostasis was ensured within the soft tissues and then the skin was closed with staples.  Honeycomb dressing was applied.  The patient was then awakened, extubated and taken to PACU in stable condition.    All counts were correct at the completion of the case.

## 2023-08-04 ENCOUNTER — Encounter (HOSPITAL_COMMUNITY): Payer: Self-pay | Admitting: Surgery

## 2023-08-04 LAB — CBC
HCT: 30.9 % — ABNORMAL LOW (ref 36.0–46.0)
Hemoglobin: 9.6 g/dL — ABNORMAL LOW (ref 12.0–15.0)
MCH: 26.8 pg (ref 26.0–34.0)
MCHC: 31.1 g/dL (ref 30.0–36.0)
MCV: 86.3 fL (ref 80.0–100.0)
Platelets: 262 10*3/uL (ref 150–400)
RBC: 3.58 MIL/uL — ABNORMAL LOW (ref 3.87–5.11)
RDW: 14.3 % (ref 11.5–15.5)
WBC: 9.8 10*3/uL (ref 4.0–10.5)
nRBC: 0 % (ref 0.0–0.2)

## 2023-08-04 LAB — BASIC METABOLIC PANEL
Anion gap: 10 (ref 5–15)
BUN: 12 mg/dL (ref 8–23)
CO2: 24 mmol/L (ref 22–32)
Calcium: 9 mg/dL (ref 8.9–10.3)
Chloride: 105 mmol/L (ref 98–111)
Creatinine, Ser: 1.16 mg/dL — ABNORMAL HIGH (ref 0.44–1.00)
GFR, Estimated: 47 mL/min — ABNORMAL LOW (ref >60)
Glucose, Bld: 120 mg/dL — ABNORMAL HIGH (ref 70–99)
Potassium: 3.3 mmol/L — ABNORMAL LOW (ref 3.5–5.1)
Sodium: 139 mmol/L (ref 135–145)

## 2023-08-04 LAB — MAGNESIUM: Magnesium: 1.9 mg/dL (ref 1.7–2.4)

## 2023-08-04 MED ORDER — DOCUSATE SODIUM 100 MG PO CAPS
100.0000 mg | ORAL_CAPSULE | Freq: Two times a day (BID) | ORAL | Status: DC
  Administered 2023-08-04 – 2023-08-09 (×11): 100 mg via ORAL
  Filled 2023-08-04 (×11): qty 1

## 2023-08-04 MED ORDER — KCL IN DEXTROSE-NACL 20-5-0.45 MEQ/L-%-% IV SOLN
INTRAVENOUS | Status: DC
  Filled 2023-08-04 (×2): qty 1000

## 2023-08-04 MED ORDER — POTASSIUM CHLORIDE 10 MEQ/100ML IV SOLN
10.0000 meq | INTRAVENOUS | Status: AC
  Administered 2023-08-04 (×5): 10 meq via INTRAVENOUS
  Filled 2023-08-04 (×4): qty 100

## 2023-08-04 NOTE — Progress Notes (Addendum)
1 Day Post-Op   Subjective/Chief Complaint: Pain this morning, slept all night   Objective: Vital signs in last 24 hours: Temp:  [97.6 F (36.4 C)-99.1 F (37.3 C)] 98.5 F (36.9 C) (01/30 0536) Pulse Rate:  [53-93] 67 (01/30 0536) Resp:  [8-19] 18 (01/30 0536) BP: (118-190)/(60-95) 118/66 (01/30 0536) SpO2:  [93 %-100 %] 94 % (01/30 0814) Weight:  [77.1 kg] 77.1 kg (01/29 0957) Last BM Date : 08/02/23  Intake/Output from previous day: 01/29 0701 - 01/30 0700 In: 2020 [P.O.:420; I.V.:1500; IV Piggyback:100] Out: 600 [Urine:600] Intake/Output this shift: No intake/output data recorded.  A&Ox3 Unlabored respirations Abd soft, mildly distended, appropriately tender. Incision c/d/I with honeycomb. Blood staining on honeycomb but no active bleeding  Lab Results:  Recent Labs    08/03/23 0314 08/04/23 0755  WBC 4.7 9.8  HGB 9.5* 9.6*  HCT 30.3* 30.9*  PLT 254 262   BMET Recent Labs    08/03/23 0904 08/04/23 0755  NA 142 139  K 3.2* 3.3*  CL 111 105  CO2 24 24  GLUCOSE 109* 120*  BUN 7* 12  CREATININE 1.10* 1.16*  CALCIUM 9.0 9.0   PT/INR No results for input(s): "LABPROT", "INR" in the last 72 hours. ABG No results for input(s): "PHART", "HCO3" in the last 72 hours.  Invalid input(s): "PCO2", "PO2"  Studies/Results: CT CHEST WO CONTRAST Result Date: 08/02/2023 CLINICAL DATA:  Colon cancer, assess treatment response. * Tracking Code: BO * EXAM: CT CHEST WITHOUT CONTRAST TECHNIQUE: Multidetector CT imaging of the chest was performed following the standard protocol without IV contrast. RADIATION DOSE REDUCTION: This exam was performed according to the departmental dose-optimization program which includes automated exposure control, adjustment of the mA and/or kV according to patient size and/or use of iterative reconstruction technique. COMPARISON:  Chest CT 01/21/2018.  Abdominal CT 07/22/2023. FINDINGS: Cardiovascular: Mild aortic and coronary artery  atherosclerosis. The heart size is normal. There is a small amount of pericardial fluid or thickening anteriorly. Mediastinum/Nodes: There are no enlarged mediastinal, hilar or axillary lymph nodes.Small hiatal hernia. The thyroid gland and trachea appear unremarkable. Lungs/Pleura: No pleural effusion or pneumothorax. Mild linear atelectasis or scarring at both lung bases. No confluent airspace disease or suspicious pulmonary nodule. Upper abdomen: No acute findings are seen in the visualized upper abdomen. As seen on the recent abdominal CT, and incompletely visualized on remote chest CTA, there is a left adrenal nodule measuring 2.1 x 1.1 cm on image 57/2. This has a nonspecific density of the 22 HU. Possible punctate nonobstructing calculus in the upper pole of the right kidney. Musculoskeletal/Chest wall: There is no chest wall mass or suspicious osseous finding. Mild spondylosis. IMPRESSION: 1. No evidence of metastatic disease in the chest. 2. Indeterminate left adrenal nodule, unchanged from recent abdominal CT and incompletely visualized on remote chest CTA. Consider further evaluation with adrenal protocol CT or MRI. 3.  Aortic Atherosclerosis (ICD10-I70.0). Electronically Signed   By: Carey Bullocks M.D.   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

## 2023-08-04 NOTE — Evaluation (Signed)
Physical Therapy Evaluation Patient Details Name: Adrienne Rogers MRN: [id number] DOB: Oct 06, 1940 Today's Date: 08/04/2023  History of Present Illness  83 yo female s/pExploratory laparotomy, segmental transverse colectomy with colocolonic anastomosis on 08/03/23 secondary to CA. PMH: R TKA,  HTN, HLD, anxiety  Clinical Impression  Pt admitted with above diagnosis.  Pt agreeable to PT however limited by dizziness upon sitting; BP 88/52 sitting EOB, assisted pt back to bed with BP 91/52 in supine. RN notified. Pt independent/nod I  at baseline however with recent decline per RN according to her dtr; will likely benefit from HHPT at d/c. Dtr in from [place], staying for 2 wks  Pt currently with functional limitations due to the deficits listed below (see PT Problem List). Pt will benefit from acute skilled PT to increase their independence and safety with mobility to allow discharge.           If plan is discharge home, recommend the following: A little help with walking and/or transfers;A little help with bathing/dressing/bathroom;Assistance with cooking/housework;Assist for transportation;Help with stairs or ramp for entrance   Can travel by private vehicle        Equipment Recommendations None recommended by PT  Recommendations for Other Services       Functional Status Assessment Patient has had a recent decline in their functional status and demonstrates the ability to make significant improvements in function in a reasonable and predictable amount of time.     Precautions / Restrictions Precautions Precautions: Fall Restrictions Weight Bearing Restrictions Per Provider Order: No      Mobility  Bed Mobility Overal bed mobility: Needs Assistance Bed Mobility: Rolling, Sidelying to Sit, Sit to Sidelying Rolling: Min assist, Used rails Sidelying to sit: Min assist     Sit to sidelying: Min assist, Mod assist General bed mobility comments: cues for log roll technique,  assist to elevate trunk to comr to upright and to list LEs on to bed d/t pain    Transfers Overall transfer level: Needs assistance Equipment used: 2 person hand held assist Transfers: Sit to/from Stand Sit to Stand: Min assist           General transfer comment: assist to rise and steady, cues for hand placement    Ambulation/Gait               General Gait Details: lateral steps along EOB only d/t soft BP  Stairs            Wheelchair Mobility     Tilt Bed    Modified Rankin (Stroke Patients Only)       Balance Overall balance assessment: Needs assistance Sitting-balance support: No upper extremity supported, Feet supported Sitting balance-Leahy Scale: Fair     Standing balance support: During functional activity, Bilateral upper extremity supported Standing balance-Leahy Scale: Poor Standing balance comment: reliant on bil UE support and external assist                             Pertinent Vitals/Pain Pain Assessment Pain Assessment: Faces Faces Pain Scale: Hurts a little bit Pain Location: abd Pain Intervention(s): Limited activity within patient's tolerance, Monitored during session    Home Living Family/patient expects to be discharged to:: Private residence Living Arrangements: Children Available Help at Discharge: Available 24 hours/day Type of Home: House Home Access: Stairs to enter   [employer] of Steps: 1   Home Layout: Able to live on main level with bedroom/bathroom;Two  level Home Equipment: Rollator (4 wheels);Cane - single point Additional Comments: does not go upstairs typicall; pt dtr her from [place] for 2 wks    Prior Function               Mobility Comments: used rollator in mornings typically, last few wks using more; per RN dtr reports recent functional decline with pt not mobilizing much       Extremity/Trunk Assessment   Upper Extremity Assessment Upper Extremity Assessment:  Overall WFL for tasks assessed    Lower Extremity Assessment Lower Extremity Assessment: Overall WFL for tasks assessed       Communication   Communication Communication: No apparent difficulties  Cognition Arousal: Alert Behavior During Therapy: WFL for tasks assessed/performed Overall Cognitive Status: Within Functional Limits for tasks assessed                                          General Comments      Exercises     Assessment/Plan    PT Assessment Patient needs continued PT services  PT Problem List Decreased activity tolerance;Decreased balance;Decreased mobility;Decreased cognition;Decreased knowledge of use of DME       PT Treatment Interventions DME instruction;Gait training;Functional mobility training;Therapeutic activities;Patient/family education;Therapeutic exercise    PT Goals (Current goals can be found in the Care Plan section)  Acute Rehab PT Goals PT Goal Formulation: With patient Time For Goal Achievement: 08/18/23 Potential to Achieve Goals: Good    Frequency Min 1X/week     Co-evaluation               AM-PAC PT "6 Clicks" Mobility  Outcome Measure Help needed turning from your back to your side while in a flat bed without using bedrails?: A Little Help needed moving from lying on your back to sitting on the side of a flat bed without using bedrails?: A Little Help needed moving to and from a bed to a chair (including a wheelchair)?: A Little Help needed standing up from a chair using your arms (e.g., wheelchair or bedside chair)?: A Little Help needed to walk in hospital room?: A Lot Help needed climbing 3-5 steps with a railing? : Total 6 Click Score: 15    End of Session Equipment Utilized During Treatment: Gait belt Activity Tolerance: Treatment limited secondary to medical complications (Comment) (low BP, dizzy) Patient left: in bed;with call bell/phone within reach;with bed alarm set Nurse Communication:  Mobility status;Other (comment) (BP) PT Visit Diagnosis: Other abnormalities of gait and mobility (R26.89)    Time: 1610-9604 PT Time Calculation (min) (ACUTE ONLY): 20 min   Charges:   PT Evaluation $PT Eval Low Complexity: 1 Low   PT General Charges $$ ACUTE PT VISIT: 1 Visit         Sanyla Summey, PT  Acute Rehab Dept [facility]) [phone]  08/04/2023   [facility] 08/04/2023, 3:27 PM

## 2023-08-04 NOTE — Anesthesia Postprocedure Evaluation (Signed)
Anesthesia Post Note  Patient: Adrienne Rogers  Procedure(s) Performed: OPEN PARTIAL COLECTOMY (Abdomen)     Patient location during evaluation: PACU Anesthesia Type: General Level of consciousness: awake and alert Pain management: pain level controlled Vital Signs Assessment: post-procedure vital signs reviewed and stable Respiratory status: spontaneous breathing, nonlabored ventilation, respiratory function stable and patient connected to nasal cannula oxygen Cardiovascular status: blood pressure returned to baseline and stable Postop Assessment: no apparent nausea or vomiting Anesthetic complications: no   No notable events documented.          Shelton Silvas

## 2023-08-05 LAB — CBC
HCT: 30.6 % — ABNORMAL LOW (ref 36.0–46.0)
Hemoglobin: 9.3 g/dL — ABNORMAL LOW (ref 12.0–15.0)
MCH: 26.6 pg (ref 26.0–34.0)
MCHC: 30.4 g/dL (ref 30.0–36.0)
MCV: 87.7 fL (ref 80.0–100.0)
Platelets: 275 10*3/uL (ref 150–400)
RBC: 3.49 MIL/uL — ABNORMAL LOW (ref 3.87–5.11)
RDW: 14.5 % (ref 11.5–15.5)
WBC: 7.6 10*3/uL (ref 4.0–10.5)
nRBC: 0 % (ref 0.0–0.2)

## 2023-08-05 LAB — BASIC METABOLIC PANEL
Anion gap: 4 — ABNORMAL LOW (ref 5–15)
BUN: 15 mg/dL (ref 8–23)
CO2: 25 mmol/L (ref 22–32)
Calcium: 8.5 mg/dL — ABNORMAL LOW (ref 8.9–10.3)
Chloride: 103 mmol/L (ref 98–111)
Creatinine, Ser: 1.34 mg/dL — ABNORMAL HIGH (ref 0.44–1.00)
GFR, Estimated: 40 mL/min — ABNORMAL LOW (ref >60)
Glucose, Bld: 119 mg/dL — ABNORMAL HIGH (ref 70–99)
Potassium: 3.8 mmol/L (ref 3.5–5.1)
Sodium: 132 mmol/L — ABNORMAL LOW (ref 135–145)

## 2023-08-05 LAB — MAGNESIUM: Magnesium: 2.1 mg/dL (ref 1.7–2.4)

## 2023-08-05 MED ORDER — METHOCARBAMOL 500 MG PO TABS
500.0000 mg | ORAL_TABLET | Freq: Four times a day (QID) | ORAL | Status: DC | PRN
  Administered 2023-08-06 – 2023-08-08 (×4): 500 mg via ORAL
  Filled 2023-08-05 (×4): qty 1

## 2023-08-05 MED ORDER — KCL IN DEXTROSE-NACL 20-5-0.45 MEQ/L-%-% IV SOLN
INTRAVENOUS | Status: DC
  Filled 2023-08-05 (×2): qty 1000

## 2023-08-05 NOTE — Progress Notes (Signed)
Mobility Specialist - Progress Note   08/05/23 0930  Mobility  Activity Stood at bedside  Level of Assistance Standby assist, set-up cues, supervision of patient - no hands on  Assistive Device Front wheel walker  Activity Response Tolerated well  Mobility Referral Yes  Mobility visit 1 Mobility  Mobility Specialist Start Time (ACUTE ONLY) [id number]  Mobility Specialist Stop Time (ACUTE ONLY) 0929  Mobility Specialist Time Calculation (min) (ACUTE ONLY) 19 min   Pt received in bed and agreeable to mobility. Once sitting up pt c/o feeling dizzy. Attempted to stand but pt stated still feeling dizzy. BP checked. No other complaints during session. Pt to bed after session with all needs met. NT in room.    BP Laying: 153/83  BP Sitting-up: 108/86   Chief Technology Officer

## 2023-08-05 NOTE — TOC Initial Note (Signed)
Transition of Care [facility]) - Initial/Assessment Note    Patient Details  Name: Adrienne Rogers MRN: [id number] Date of Birth: [birth year]  [facility] CM/SW Contact:    Amada Jupiter, LCSW Phone Number: 08/05/2023, 3:26 PM  Clinical Narrative:                 Met with pt today and have spoken with daughter, Annabelle Harman via phone to review dc needs.  Daughter confirms that she will be staying with pt at discharge until mid February and another family member will be her after that.  Discussed recommendation for HHPT follow up and both are agreeable without any agency preferences - HHPT secured with [facility].  Both confirm that pt has a RW in the home already so no DME needs.  TOC will remain available if any new dc need arise.  Expected Discharge Plan: Home w Home Health Services Barriers to Discharge: Continued Medical Work up   Patient Goals and CMS Choice Patient states their goals for this hospitalization and ongoing recovery are:: return home          Expected Discharge Plan and Services     Post Acute Care Choice: Home Health Living arrangements for the past 2 months: Single Family Home                 DME Arranged: N/A DME Agency: NA       HH Arranged: PT HH AgencyHotel manager Home Health Care [facility] Agency Contacted: 08/05/23 Time HH Agency Contacted: 1525 Representative spoke with at [facility] Agency: Kandee Keen  Prior Living Arrangements/Services Living arrangements for the past 2 months: Single Family Home Lives with:: Self Patient language and need for interpreter reviewed:: Yes Do you feel safe going back to the place where you live?: Yes      Need for Family Participation in Patient Care: Yes (Comment) Care giver support system in place?: Yes (comment)   Criminal Activity/Legal Involvement Pertinent to Current Situation/Hospitalization: No - Comment as needed  Activities of Daily Living   ADL Screening (condition at time of admission) Independently performs  ADLs?: Yes (appropriate for developmental age) Is the patient deaf or have difficulty hearing?: Yes Does the patient have difficulty seeing, even when wearing glasses/contacts?: No Does the patient have difficulty concentrating, remembering, or making decisions?: No  Permission Sought/Granted Permission sought to share information with : Family Supports Permission granted to share information with : Yes, Verbal Permission Granted  Share Information with NAME: daughter, Bartholomew Crews @ [phone]           Emotional Assessment Appearance:: Appears stated age Attitude/Demeanor/Rapport: Engaged, Gracious Affect (typically observed): Accepting Orientation: : Oriented to Self, Oriented to Place, Oriented to  Time, Oriented to Situation Alcohol / Substance Use: Not Applicable Psych Involvement: No (comment)  Admission diagnosis:  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 [place], [place] - [address] AT [facility] [address] [facility] [address] [place] [address] Phone: [phone] Fax:  [phone]     Social Drivers of Health (SDOH) Social History: SDOH Screenings   Food Insecurity: No Food Insecurity (08/02/2023)  Housing: Low Risk  (08/02/2023)  Transportation Needs: No Transportation Needs (08/02/2023)  Utilities: Not At Risk (08/02/2023)  Financial Resource Strain: Low Risk  (07/21/2021)   Received from [facility]  Physical Activity: Insufficiently Active (07/21/2021)    Received from [facility]  Social Connections: Moderately Integrated (08/02/2023)  Stress: Stress Concern Present (07/21/2021)   Received from [facility]  Tobacco Use: Low Risk  (08/03/2023)  Recent Concern: Tobacco Use - Medium Risk (08/02/2023)   Received from [facility]   SDOH Interventions:     Readmission Risk Interventions    08/05/2023    3:21 PM  Readmission Risk Prevention Plan  Transportation Screening Complete  PCP or Specialist Appt within 5-7 Days Complete  Home Care Screening Complete  Medication Review (RN CM) Complete

## 2023-08-05 NOTE — Progress Notes (Signed)
2 Days Post-Op   Subjective/Chief Complaint: Having some incontinence so has purewick in place for that.  Sat in chair most of the day yesterday.  Tried to ambulate but gets orthostatic.  Tolerating CLD with + flatus, no BM.  Daughter at bedside   Objective: Vital signs in last 24 hours: Temp:  [98 F (36.7 C)-98.3 F (36.8 C)] 98 F (36.7 C) (01/31 0507) Pulse Rate:  [57-66] 65 (01/31 0507) Resp:  [16-18] 18 (01/31 0507) BP: (88-150)/(52-82) 140/82 (01/31 0507) SpO2:  [93 %-96 %] 93 % (01/31 0907) Last BM Date : 08/02/23  Intake/Output from previous day: 01/30 0701 - 01/31 0700 In: 1098.6 [I.V.:598.6; IV Piggyback:500] Out: 800 [Urine:800] Intake/Output this shift: Total I/O In: 84.9 [I.V.:84.9] Out: -   A&Ox3 Unlabored respirations Abd soft, mildly distended, appropriately tender. Incision c/d/I with honeycomb. Blood staining on honeycomb but no active bleeding Purewick with clear yellow urine in cannister  Lab Results:  Recent Labs    08/04/23 0755 08/05/23 0320  WBC 9.8 7.6  HGB 9.6* 9.3*  HCT 30.9* 30.6*  PLT 262 275   BMET Recent Labs    08/04/23 0755 08/05/23 0320  NA 139 132*  K 3.3* 3.8  CL 105 103  CO2 24 25  GLUCOSE 120* 119*  BUN 12 15  CREATININE 1.16* 1.34*  CALCIUM 9.0 8.5*   PT/INR No results for input(s): "LABPROT", "INR" in the last 72 hours. ABG No results for input(s): "PHART", "HCO3" in the last 72 hours.  Invalid input(s): "PCO2", "PO2"  Studies/Results: No results found.   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  Will hold  spironolactone and keep Coreg for today.  Cont low rate IVFs at 50cc for 24 more hours.  Add TED hose while out of bed.   -Multimodal pain control -hypokalemia 3.8 -Adv to FLD today and hold advancement for further bowel function -cr 1.34, which appears generally around her baseline.  Good UOP -pathology discussed with patient and daughter at bedside.  Invasive adenoca with 2 +LNs.  Will make outpatient referral to onc.    LOS: 3 days    Letha Cape 08/05/2023

## 2023-08-05 NOTE — Progress Notes (Signed)
Physical Therapy Treatment Patient Details Name: Adrienne Rogers MRN: [id number] DOB: 08/10/1940 Today's Date: 08/05/2023   History of Present Illness 83 yo female s/p Exploratory laparotomy, segmental transverse colectomy with colocolonic anastomosis on 08/03/23 secondary to CA. PMH: R TKA,  HTN, HLD, anxiety    PT Comments  Pt with improved BP this afternoon (most recent 148/88 mmHg).  Pt agreeable to mobilize and ambulated into hallway.  Distance limited by pain and fatigue but pt denies dizziness.     If plan is discharge home, recommend the following: A little help with walking and/or transfers;A little help with bathing/dressing/bathroom;Assistance with cooking/housework;Assist for transportation;Help with stairs or ramp for entrance   Can travel by private vehicle        Equipment Recommendations  None recommended by PT    Recommendations for Other Services       Precautions / Restrictions Precautions Precautions: Fall Restrictions Weight Bearing Restrictions Per Provider Order: No     Mobility  Bed Mobility Overal bed mobility: Needs Assistance Bed Mobility: Rolling, Sidelying to Sit, Sit to Sidelying Rolling: Contact guard assist, Used rails Sidelying to sit: Contact guard assist, Used rails     Sit to sidelying: Contact guard assist, Used rails General bed mobility comments: cues for log roll technique, able to perform without assist    Transfers Overall transfer level: Needs assistance Equipment used: Rolling walker (2 wheels) Transfers: Sit to/from Stand Sit to Stand: Min assist           General transfer comment: assist to rise and steady, cues for hand placement    Ambulation/Gait Ambulation/Gait assistance: Contact guard assist Gait Distance (Feet): 35 Feet Assistive device: Rolling walker (2 wheels) Gait Pattern/deviations: Step-through pattern, Decreased stride length, Trunk flexed       General Gait Details: verbal cues for RW  positioning and posture as tolerated; pt reports pain and fatigue limiting distance but denies dizziness   Stairs             Wheelchair Mobility     Tilt Bed    Modified Rankin (Stroke Patients Only)       Balance                                            Cognition Arousal: Alert Behavior During Therapy: WFL for tasks assessed/performed Overall Cognitive Status: Within Functional Limits for tasks assessed                                          Exercises      General Comments        Pertinent Vitals/Pain Pain Assessment Pain Assessment: Faces Faces Pain Scale: Hurts a little bit Pain Location: abd Pain Descriptors / Indicators: Sore, Tender Pain Intervention(s): Monitored during session, Repositioned    Home Living                          Prior Function            PT Goals (current goals can now be found in the care plan section) Progress towards PT goals: Progressing toward goals    Frequency    Min 1X/week      PT Plan      Co-evaluation  AM-PAC PT "6 Clicks" Mobility   Outcome Measure  Help needed turning from your back to your side while in a flat bed without using bedrails?: A Little Help needed moving from lying on your back to sitting on the side of a flat bed without using bedrails?: A Little Help needed moving to and from a bed to a chair (including a wheelchair)?: A Little Help needed standing up from a chair using your arms (e.g., wheelchair or bedside chair)?: A Little Help needed to walk in hospital room?: A Little Help needed climbing 3-5 steps with a railing? : A Lot 6 Click Score: 17    End of Session Equipment Utilized During Treatment: Gait belt Activity Tolerance: Patient limited by pain Patient left: in bed;with call bell/phone within reach   PT Visit Diagnosis: Other abnormalities of gait and mobility (R26.89)     Time: 4782-9562 PT Time  Calculation (min) (ACUTE ONLY): 16 min  Charges:    $Gait Training: 8-22 mins PT General Charges $$ ACUTE PT VISIT: 1 Visit                     Paulino Door, DPT Physical Therapist Acute Rehabilitation Services Office: [phone]    [name] 08/05/2023, 3:22 PM

## 2023-08-06 LAB — CBC
HCT: 28.4 % — ABNORMAL LOW (ref 36.0–46.0)
Hemoglobin: 9.1 g/dL — ABNORMAL LOW (ref 12.0–15.0)
MCH: 27.6 pg (ref 26.0–34.0)
MCHC: 32 g/dL (ref 30.0–36.0)
MCV: 86.1 fL (ref 80.0–100.0)
Platelets: 279 10*3/uL (ref 150–400)
RBC: 3.3 MIL/uL — ABNORMAL LOW (ref 3.87–5.11)
RDW: 14.5 % (ref 11.5–15.5)
WBC: 6.1 10*3/uL (ref 4.0–10.5)
nRBC: 0 % (ref 0.0–0.2)

## 2023-08-06 LAB — BASIC METABOLIC PANEL
Anion gap: 6 (ref 5–15)
BUN: 17 mg/dL (ref 8–23)
CO2: 23 mmol/L (ref 22–32)
Calcium: 9 mg/dL (ref 8.9–10.3)
Chloride: 111 mmol/L (ref 98–111)
Creatinine, Ser: 0.96 mg/dL (ref 0.44–1.00)
GFR, Estimated: 59 mL/min — ABNORMAL LOW (ref >60)
Glucose, Bld: 101 mg/dL — ABNORMAL HIGH (ref 70–99)
Potassium: 4.2 mmol/L (ref 3.5–5.1)
Sodium: 140 mmol/L (ref 135–145)

## 2023-08-06 MED ORDER — ENSURE ENLIVE PO LIQD: 237.0000 mL | Freq: Two times a day (BID) | ORAL | Status: DC

## 2023-08-06 MED ORDER — HYDROMORPHONE HCL 1 MG/ML IJ SOLN
0.5000 mg | INTRAMUSCULAR | Status: DC | PRN
  Filled 2023-08-06: qty 1

## 2023-08-06 NOTE — Progress Notes (Signed)
3 Days Post-Op   Subjective/Chief Complaint: Having flatus, no bm, tol diet, was oob some   Objective: Vital signs in last 24 hours: Temp:  [98.2 F (36.8 C)-99 F (37.2 C)] 98.8 F (37.1 C) (02/01 0426) Pulse Rate:  [65-72] 69 (02/01 0426) Resp:  [18] 18 (02/01 0426) BP: (148-154)/(66-88) 154/66 (02/01 0426) SpO2:  [95 %-97 %] 97 % (02/01 0426) Last BM Date : 08/02/23  Intake/Output from previous day: 01/31 0701 - 02/01 0700 In: 1413 [P.O.:720; I.V.:693] Out: 1200 [Urine:1200] Intake/Output this shift: No intake/output data recorded.  General nad Cv regular Pulm effort normal Ab soft not distended dressing dry approp tender  Lab Results:  Recent Labs    08/05/23 0320 08/06/23 0315  WBC 7.6 6.1  HGB 9.3* 9.1*  HCT 30.6* 28.4*  PLT 275 279   BMET Recent Labs    08/05/23 0320 08/06/23 0315  NA 132* 140  K 3.8 4.2  CL 103 111  CO2 25 23  GLUCOSE 119* 101*  BUN 15 17  CREATININE 1.34* 0.96  CALCIUM 8.5* 9.0   PT/INR No results for input(s): "LABPROT", "INR" in the last 72 hours. ABG No results for input(s): "PHART", "HCO3" in the last 72 hours.  Invalid input(s): "PCO2", "PO2"  Studies/Results: No results found.  Anti-infectives: Anti-infectives (From admission, onward)    Start     Dose/Rate Route Frequency Ordered Stop   08/03/23 1203  metroNIDAZOLE (FLAGYL) 500 MG/100ML IVPB       Note to Pharmacy: Evern Bio K: cabinet override      08/03/23 1203 08/03/23 1208       Assessment/Plan: POD 3 S/p transverse colectomy with colocolonic anastomosis    -Mobilize, PT, continue to  hold spironolactone and keep Coreg for today.  Will stop ivf today as cr now normal -Multimodal pain control -hypokalemia 3.8 -will do soft diet as she is having flatus -outpatient referral to onc. -oob, pulm toilet    Emelia Loron 08/06/2023

## 2023-08-06 NOTE — Progress Notes (Signed)
Physical Therapy Treatment Patient Details Name: Adrienne Rogers MRN: [id number] DOB: 01/08/1941 Today's Date: 08/06/2023   History of Present Illness 83 yo female s/p Exploratory laparotomy, segmental transverse colectomy with colocolonic anastomosis on 08/03/23 secondary to CA. PMH: R TKA,  HTN, HLD, anxiety    PT Comments  Pt assisted with ambulating and able to improve distance to 80 ft this morning.  Pt denies dizziness today but endorses fatigue.  Pt agreeable to remain OOB in recliner end of session.  Pt with purewick on arrival and encouraged pt to use BSC or ambulate to bathroom today to increase activity (especially since pt not dizzy now).     If plan is discharge home, recommend the following: A little help with walking and/or transfers;A little help with bathing/dressing/bathroom;Assistance with cooking/housework;Assist for transportation;Help with stairs or ramp for entrance   Can travel by private vehicle        Equipment Recommendations  None recommended by PT    Recommendations for Other Services       Precautions / Restrictions Precautions Precautions: Fall     Mobility  Bed Mobility Overal bed mobility: Needs Assistance Bed Mobility: Rolling, Sidelying to Sit Rolling: Contact guard assist, Used rails         General bed mobility comments: cues for log roll technique, able to perform without assist    Transfers Overall transfer level: Needs assistance Equipment used: Rolling walker (2 wheels) Transfers: Sit to/from Stand Sit to Stand: Contact guard assist           General transfer comment: cues for hand placement    Ambulation/Gait Ambulation/Gait assistance: Contact guard assist Gait Distance (Feet): 80 Feet Assistive device: Rolling walker (2 wheels) Gait Pattern/deviations: Step-through pattern, Decreased stride length, Trunk flexed Gait velocity: decr     General Gait Details: verbal cues for RW positioning and posture as tolerated;  pt reports pain and fatigue limiting distance but denies dizziness   Stairs             Wheelchair Mobility     Tilt Bed    Modified Rankin (Stroke Patients Only)       Balance                                            Cognition Arousal: Alert Behavior During Therapy: WFL for tasks assessed/performed Overall Cognitive Status: Within Functional Limits for tasks assessed                                          Exercises      General Comments        Pertinent Vitals/Pain Pain Assessment Pain Assessment: Faces Faces Pain Scale: Hurts a little bit Pain Location: abd Pain Descriptors / Indicators: Sore, Tender Pain Intervention(s): Repositioned, Premedicated before session, Monitored during session    Home Living                          Prior Function            PT Goals (current goals can now be found in the care plan section) Progress towards PT goals: Progressing toward goals    Frequency    Min 1X/week      PT Plan  Co-evaluation              AM-PAC PT "6 Clicks" Mobility   Outcome Measure  Help needed turning from your back to your side while in a flat bed without using bedrails?: A Little Help needed moving from lying on your back to sitting on the side of a flat bed without using bedrails?: A Little Help needed moving to and from a bed to a chair (including a wheelchair)?: A Little Help needed standing up from a chair using your arms (e.g., wheelchair or bedside chair)?: A Little Help needed to walk in hospital room?: A Little Help needed climbing 3-5 steps with a railing? : A Lot 6 Click Score: 17    End of Session Equipment Utilized During Treatment: Gait belt Activity Tolerance: Patient tolerated treatment well Patient left: in chair;with call bell/phone within reach;with chair alarm set Nurse Communication: Mobility status PT Visit Diagnosis: Other abnormalities of gait  and mobility (R26.89)     Time: 1610-9604 PT Time Calculation (min) (ACUTE ONLY): 13 min  Charges:    $Gait Training: 8-22 mins PT General Charges $$ ACUTE PT VISIT: 1 Visit                     Adrienne Rogers, DPT Physical Therapist Acute Rehabilitation Services Office: [phone]    Adrienne Rogers 08/06/2023, 1:14 PM

## 2023-08-07 NOTE — Plan of Care (Signed)

## 2023-08-07 NOTE — Progress Notes (Signed)
4 Days Post-Op   Subjective/Chief Complaint: Having flatus, no bm, tol diet, oob, feels great this am   Objective: Vital signs in last 24 hours: Temp:  [97.6 F (36.4 C)-98.6 F (37 C)] 97.6 F (36.4 C) (02/02 0548) Pulse Rate:  [64-68] 64 (02/02 0548) Resp:  [15-18] 16 (02/02 0548) BP: (146-158)/(68-85) 158/85 (02/02 0548) SpO2:  [95 %-99 %] 97 % (02/02 0548) Last BM Date : 09/02/23  Intake/Output from previous day: 02/01 0701 - 02/02 0700 In: 120 [P.O.:120] Out: 1000 [Urine:1000] Intake/Output this shift: No intake/output data recorded.  General nad CV regular Pulm effort normal Ab soft nontender nondistended dressing with some old drainage  Lab Results:  Recent Labs    08/05/23 0320 08/06/23 0315  WBC 7.6 6.1  HGB 9.3* 9.1*  HCT 30.6* 28.4*  PLT 275 279   BMET Recent Labs    08/05/23 0320 08/06/23 0315  NA 132* 140  K 3.8 4.2  CL 103 111  CO2 25 23  GLUCOSE 119* 101*  BUN 15 17  CREATININE 1.34* 0.96  CALCIUM 8.5* 9.0   PT/INR No results for input(s): "LABPROT", "INR" in the last 72 hours. ABG No results for input(s): "PHART", "HCO3" in the last 72 hours.  Invalid input(s): "PCO2", "PO2"  Studies/Results: No results found.  Anti-infectives: Anti-infectives (From admission, onward)    Start     Dose/Rate Route Frequency Ordered Stop   08/03/23 1203  metroNIDAZOLE (FLAGYL) 500 MG/100ML IVPB       Note to Pharmacy: Evern Bio K: cabinet override      08/03/23 1203 08/03/23 1208       Assessment/Plan: POD 4 S/p transverse colectomy with colocolonic anastomosis    -Mobilize, PT, continue to  hold spironolactone and keep Coreg . Hold losartan still she takes this apparently as needed but will hold given Cr just normalized.  -Multimodal pain control -soft diet -outpatient referral to onc. -oob, pulm toilet -dc purewick -hopefully home early this week   Emelia Loron 08/07/2023

## 2023-08-08 ENCOUNTER — Encounter: Payer: Self-pay | Admitting: *Deleted

## 2023-08-08 MED ORDER — POLYETHYLENE GLYCOL 3350 17 G PO PACK
17.0000 g | PACK | Freq: Every day | ORAL | Status: DC
  Administered 2023-08-08 – 2023-08-09 (×2): 17 g via ORAL
  Filled 2023-08-08 (×2): qty 1

## 2023-08-08 MED ORDER — SPIRONOLACTONE 25 MG PO TABS
25.0000 mg | ORAL_TABLET | Freq: Every day | ORAL | Status: DC
  Administered 2023-08-08 – 2023-08-09 (×2): 25 mg via ORAL
  Filled 2023-08-08 (×2): qty 1

## 2023-08-08 NOTE — Plan of Care (Signed)
  Problem: Education: Goal: Knowledge of General Education information will improve Description: Including pain rating scale, medication(s)/side effects and non-pharmacologic comfort measures Outcome: Progressing   Problem: Clinical Measurements: Goal: Will remain free from infection Outcome: Progressing   Problem: Activity: Goal: Risk for activity intolerance will decrease Outcome: Progressing   Problem: Nutrition: Goal: Adequate nutrition will be maintained Outcome: Progressing   Problem: Pain Managment: Goal: General experience of comfort will improve and/or be controlled Outcome: Progressing   Problem: Safety: Goal: Ability to remain free from injury will improve Outcome: Progressing   Problem: Skin Integrity: Goal: Risk for impaired skin integrity will decrease Outcome: Progressing   Problem: Clinical Measurements: Goal: Postoperative complications will be avoided or minimized Outcome: Progressing

## 2023-08-08 NOTE — Progress Notes (Signed)
Oncology Discharge Planning Note  [facility] [place] Address: [address], [place], [place] [address] Hours of Operation:  Lewayne Bunting, Monday - Friday  Clinic Contact Information:  [phone]) [phone]  Oncology Care Team: Medical Oncologist:  Truett Perna  Patient Details: Name:  Adrienne Rogers, Adrienne Rogers MRN:   [id number] DOB:   1941-03-30 Reason for Current Admission: @PPROB @  Discharge Planning Narrative: Notification of admission received by Inpatient team for [facility].  Discharge follow-up appointments for oncology are current and available on the AVS and MyChart.   Upon discharge from the hospital, hematology/oncology's post discharge plan of care for the outpatient setting is:  February 11,2025 at 1:40 pm  [facility] [address] [place], [place] [address]  [phone]   Adrienne Rogers will be called within two business days after discharge to review hematology/oncology's plan of care for full understanding.    Outpatient Oncology Specific Care Only: Oncology appointment transportation needs addressed?:  no Oncology medication management for symptom management addressed?:  not applicable Chemo Alert Card reviewed?:  not applicable Immunotherapy Alert Card reviewed?:  not applicable

## 2023-08-08 NOTE — Progress Notes (Signed)
Physical Therapy Treatment Patient Details Name: Adrienne Rogers MRN: [id number] DOB: 03/25/1941 Today's Date: 08/08/2023   History of Present Illness 83 yo female s/p Exploratory laparotomy, segmental transverse colectomy with colocolonic anastomosis on 08/03/23 secondary to CA. PMH: R TKA,  HTN, HLD, anxiety    PT Comments  POD # 5 General Comments: AxO x 3 pleasant Lady who lives with her daughter.  Was able to eat a regular diet breakfast this am.  Only mild ABD discomfort.  No BM yet.  Pt plans to return home when medically cleared. Assisted OOB to amb went well.  General bed mobility comments: cues for log roll technique, able to perform without assist. General transfer comment: cues for hand placement and increased time but able to self rise. General Gait Details: verbal cues for RW positioning and posture as tolerated; tolerated a functional distance with light need for walker. Positioned in recliner to comfort.      If plan is discharge home, recommend the following: A little help with walking and/or transfers;A little help with bathing/dressing/bathroom;Assistance with cooking/housework;Assist for transportation;Help with stairs or ramp for entrance   Can travel by private vehicle        Equipment Recommendations  None recommended by PT    Recommendations for Other Services       Precautions / Restrictions Precautions Precautions: Fall Restrictions Weight Bearing Restrictions Per Provider Order: No     Mobility  Bed Mobility Overal bed mobility: Needs Assistance Bed Mobility: Sidelying to Sit   Sidelying to sit: Contact guard assist, Used rails       General bed mobility comments: cues for log roll technique, able to perform without assist    Transfers Overall transfer level: Needs assistance Equipment used: Rolling walker (2 wheels) Transfers: Sit to/from Stand Sit to Stand: Contact guard assist           General transfer comment: cues for hand  placement and increased time but able to self rise.    Ambulation/Gait Ambulation/Gait assistance: Contact guard assist, Supervision Gait Distance (Feet): 75 Feet Assistive device: Rolling walker (2 wheels) Gait Pattern/deviations: Step-through pattern, Decreased stride length, Trunk flexed Gait velocity: decr     General Gait Details: verbal cues for RW positioning and posture as tolerated; tolerated a functional distance with light need for walker.   Stairs             Wheelchair Mobility     Tilt Bed    Modified Rankin (Stroke Patients Only)       Balance                                            Cognition Arousal: Alert Behavior During Therapy: WFL for tasks assessed/performed Overall Cognitive Status: Within Functional Limits for tasks assessed                                 General Comments: AxO x 3 pleasant Lady who lives with her daughter.  Was able to eat a regular diet breakfast this am.  Only mild ABD discomfort.  No BM yet.  Pt plans to return home when medically cleared.        Exercises      General Comments        Pertinent Vitals/Pain Pain Assessment Pain Assessment: Faces Faces  Pain Scale: Hurts a little bit Pain Location: abd Pain Descriptors / Indicators: Sore, Tender Pain Intervention(s): Monitored during session, Repositioned    Home Living                          Prior Function            PT Goals (current goals can now be found in the care plan section) Progress towards PT goals: Progressing toward goals    Frequency    Min 1X/week      PT Plan      Co-evaluation              AM-PAC PT "6 Clicks" Mobility   Outcome Measure  Help needed turning from your back to your side while in a flat bed without using bedrails?: A Little Help needed moving from lying on your back to sitting on the side of a flat bed without using bedrails?: A Little Help needed moving to  and from a bed to a chair (including a wheelchair)?: A Little Help needed standing up from a chair using your arms (e.g., wheelchair or bedside chair)?: A Little Help needed to walk in hospital room?: A Little Help needed climbing 3-5 steps with a railing? : A Little 6 Click Score: 18    End of Session Equipment Utilized During Treatment: Gait belt Activity Tolerance: Patient tolerated treatment well Patient left: in chair;with call bell/phone within reach;with chair alarm set Nurse Communication: Mobility status PT Visit Diagnosis: Other abnormalities of gait and mobility (R26.89)     Time: 0910-0930 PT Time Calculation (min) (ACUTE ONLY): 20 min  Charges:    $Gait Training: 8-22 mins PT General Charges $$ ACUTE PT VISIT: 1 Visit                     Felecia Shelling  PTA Acute  Rehabilitation Services Office M-F          [phone]

## 2023-08-08 NOTE — Progress Notes (Signed)
5 Days Post-Op   Subjective/Chief Complaint: Having flatus, no bm, tol diet, oob, feels great this am and wanting to go home   Objective: Vital signs in last 24 hours: Temp:  [97.9 F (36.6 C)-98.6 F (37 C)] 98.6 F (37 C) (02/03 0631) Pulse Rate:  [60-68] 60 (02/03 0631) Resp:  [16-18] 18 (02/03 0631) BP: (144-151)/(63-86) 151/69 (02/03 0631) SpO2:  [95 %-98 %] 96 % (02/03 0920) Last BM Date : 08/02/23  Intake/Output from previous day: No intake/output data recorded. Intake/Output this shift: No intake/output data recorded.  General nad Pulm effort normal Ab soft nontender nondistended dressing with some old drainage, but staples intact  Lab Results:  Recent Labs    08/06/23 0315  WBC 6.1  HGB 9.1*  HCT 28.4*  PLT 279   BMET Recent Labs    08/06/23 0315  NA 140  K 4.2  CL 111  CO2 23  GLUCOSE 101*  BUN 17  CREATININE 0.96  CALCIUM 9.0   PT/INR No results for input(s): "LABPROT", "INR" in the last 72 hours. ABG No results for input(s): "PHART", "HCO3" in the last 72 hours.  Invalid input(s): "PCO2", "PO2"  Studies/Results: No results found.  Anti-infectives: Anti-infectives (From admission, onward)    Start     Dose/Rate Route Frequency Ordered Stop   08/03/23 1203  metroNIDAZOLE (FLAGYL) 500 MG/100ML IVPB       Note to Pharmacy: Evern Bio K: cabinet override      08/03/23 1203 08/03/23 1208       Assessment/Plan: POD 5, S/p transverse colectomy with colocolonic anastomosis    -Mobilize, HH PT arranged -orthostatic hypotension resolved -Multimodal pain control -soft diet -outpatient referral to onc. made -oob, pulm toilet -voiding well -no BM yet.  Add miralax today.  Plan for DC when she has a BM   Letha Cape 08/08/2023

## 2023-08-08 NOTE — Progress Notes (Unsigned)
Scheduled Ms Gunkel with Dr Truett Perna on 08/16/23 at 1:40pm, left voicemail regarding scheduling and to call with any questions.  Discharge note in chart with appointment information

## 2023-08-09 MED ORDER — MORPHINE SULFATE (PF) 2 MG/ML IV SOLN: 2.0000 mg | INTRAVENOUS | Status: DC | PRN

## 2023-08-09 MED ORDER — ALBUTEROL SULFATE HFA 108 (90 BASE) MCG/ACT IN AERS: 2.0000 | INHALATION_SPRAY | Freq: Four times a day (QID) | RESPIRATORY_TRACT | Status: AC | PRN

## 2023-08-09 MED ORDER — ACETAMINOPHEN 500 MG PO TABS: 1000.0000 mg | ORAL_TABLET | Freq: Three times a day (TID) | ORAL | Status: AC | PRN

## 2023-08-09 MED ORDER — DOCUSATE SODIUM 100 MG PO CAPS: 100.0000 mg | ORAL_CAPSULE | Freq: Two times a day (BID) | ORAL | Status: AC | PRN

## 2023-08-09 MED ORDER — POLYETHYLENE GLYCOL 3350 17 G PO PACK: 17.0000 g | PACK | Freq: Two times a day (BID) | ORAL | Status: AC | PRN

## 2023-08-09 MED ORDER — METHOCARBAMOL 500 MG PO TABS: 500.0000 mg | ORAL_TABLET | Freq: Four times a day (QID) | ORAL | 0 refills | Status: AC | PRN

## 2023-08-09 MED ORDER — TRAMADOL HCL 50 MG PO TABS: 50.0000 mg | ORAL_TABLET | Freq: Four times a day (QID) | ORAL | 0 refills | Status: DC | PRN

## 2023-08-09 NOTE — Discharge Instructions (Addendum)
[facility]      [facility], [place] [phone]  OPEN ABDOMINAL SURGERY: POST OP INSTRUCTIONS  Always review your discharge instruction sheet given to you by the facility where your surgery was performed.  IF YOU HAVE DISABILITY OR FAMILY LEAVE FORMS, YOU MUST BRING THEM TO THE OFFICE FOR PROCESSING.  PLEASE DO NOT GIVE THEM TO YOUR DOCTOR.  A prescription for pain medication may be given to you upon discharge.  Take your pain medication as prescribed, if needed.  If narcotic pain medicine is not needed, then you may take acetaminophen (Tylenol) or ibuprofen (Advil) as needed. Take your usually prescribed medications unless otherwise directed. If you need a refill on your pain medication, please contact your pharmacy. They will contact our office to request authorization.  Prescriptions will not be filled after 5pm or on week-ends. You should follow a light diet the first few days after arrival home, such as soup and crackers, pudding, etc.unless your doctor has advised otherwise. A high-fiber, low fat diet can be resumed as tolerated.   Be sure to include lots of fluids daily. Most patients will experience some swelling and bruising on the chest and neck area.  Ice packs will help.  Swelling and bruising can take several days to resolve Most patients will experience some swelling and bruising in the area of the incision. Ice pack will help. Swelling and bruising can take several days to resolve..  It is common to experience some constipation if taking pain medication after surgery.  Increasing fluid intake and taking a stool softener will usually help or prevent this problem from occurring.  A mild laxative (Milk of Magnesia or Miralax) should be taken according to package directions if there are no bowel movements after 48 hours.  You may have steri-strips (small skin tapes) in place directly over the incision.  These strips should be left on the skin for 7-10 days.  If your surgeon used skin  glue on the incision, you may shower in 24 hours.  The glue will flake off over the next 2-3 weeks.  Any sutures or staples will be removed at the office during your follow-up visit. You may find that a light gauze bandage over your incision may keep your staples from being rubbed or pulled. You may shower and replace the bandage daily. ACTIVITIES:  You may resume regular (light) daily activities beginning the next day--such as daily self-care, walking, climbing stairs--gradually increasing activities as tolerated.  You may have sexual intercourse when it is comfortable.  Refrain from any heavy lifting or straining until approved by your doctor. You may drive when you no longer are taking prescription pain medication, you can comfortably wear a seatbelt, and you can safely maneuver your car and apply brakes Return to Work: ___________________________________ Adrienne Rogers should see your doctor in the office for a follow-up appointment approximately two weeks after your surgery.  Make sure that you call for this appointment within a day or two after you arrive home to insure a convenient appointment time. OTHER INSTRUCTIONS:  _____________________________________________________________ _____________________________________________________________  WHEN TO CALL YOUR DOCTOR: Fever over 101.0 Inability to urinate Nausea and/or vomiting Extreme swelling or bruising Continued bleeding from incision. Increased pain, redness, or drainage from the incision. Difficulty swallowing or breathing Muscle cramping or spasms. Numbness or tingling in hands or feet or around lips.  The clinic staff is available to answer your questions during regular business hours.  Please don't hesitate to call and ask to speak to one of  the nurses if you have concerns.  For further questions, please visit [email or web address]   Your CT scan showed a  Indeterminate left adrenal nodule, unchanged from recent abdominal CT.  Please discuss with your oncologist if you need further evaluation with a adrenal protocol CT or MRI.

## 2023-08-09 NOTE — Progress Notes (Signed)
 Patient discharged home, IV removed, discharge paperwork provided and explained, patient verbalized understanding.

## 2023-08-09 NOTE — Discharge Summary (Signed)
 Patient ID: Adrienne Rogers [id number] [birth year] 83 y.o.  Admit date: 08/02/2023 Discharge date: 08/09/2023   Discharge Diagnosis S/p Exploratory laparotomy, segmental transverse colectomy with colocolonic anastomosis by Dr. Signe on 08/03/23  Consultants Oncology  HPI Adrienne Rogers is a 83 y.o. female with history of HTN, HLD, GERD, whom is seen in the office today as a referral by Dr. Kristie for evaluation of colon cancer, transverse colon.     Colonoscopy 07/27/23 (Dr. Kristie): -Likely malignant near obstructing tumor in the mid ascending colon, biopsied, mucosa 5 cm distal to the mass injected with tattoo ink. - Ascending colon is not visualized - Rest of examination was otherwise normal on direct and retroflexion view   Pathology: Invasive colonic adenocarcinoma, moderately differentiated.   CT A/P 07/22/23: Proximal colonic distention with transition point to decreased caliber 16 cm from the hepatic flexure.  4 cm lesion in the distal transverse colon.  Several lymph nodes in transverse mesocolon up to 8 mm are noted.  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   History of Present Illness Adrienne Rogers is an 83 year old female who presents with bowel obstruction symptoms. She is accompanied by her daughter. She was referred by Dr. Kristie for evaluation of a colon mass found during colonoscopy.   A colonoscopy on January 17th revealed a mass in the transverse colon, which was biopsied and identified as cancerous. A CT scan of the abdomen showed the mass but no other significant findings except for a slightly enlarged adrenal nodule, which will need to be monitored. She has not had any significant bowel movements since the colonoscopy prep.   She presents with symptoms of bowel obstruction, having experienced a change in bowel habits over the past several months. Initially, she managed constipation with laxatives, which were effective until two weeks ago. Since then, she  has been on a clear liquid diet and has lost approximately 20 pounds. No nausea or vomiting since starting the clear liquid diet, but prior to that, she experienced nausea and vomiting, sometimes needing to vomit before having a bowel movement. Currently, she is passing gas and occasionally liquid stool every two to three days, but no solid stools or significant volume.   She denies any history of heart attacks, strokes, or use of blood thinners. 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  Procedures Dr. Signe - 08/03/23 - Exploratory laparotomy, segmental transverse colectomy with colocolonic anastomosis   Hospital Course:  Patient presented as above and underwent Exploratory laparotomy, segmental transverse colectomy with colocolonic anastomosis by Dr. Signe on 08/03/23. Pt tolerated the procedure well. Diet adv post op. Path w/ Invasive moderately differentiated colonic adenocarcinoma. CT chest and CEA obtained. Oncology has arranged f/u with Dr Cloretta on 08/16/23 at 1:40pm. Patient worked with PT who recommended HH. This was arranged by TOC. On POD 6, the patient was voiding well, tolerating diet, having bowel function, working well w/ therapies, pain well controlled, vital signs stable, incisions c/d/i and felt stable for discharge home. Discussed discharge instructions, restrictions and return/call back precautions. Follow up as noted below. She plans to stay w/ her daughter at d/c.   Physical Exam: Gen: Awake and alert, NAD Heart: Reg Lungs: CTA b/l, normal rate and effort Abd: Soft, ND, NT, midline wound with staples cdi Msk: No LE edema. MAE's  Allergies as of 08/09/2023       Reactions   Lisinopril Tinitus   Ezetimibe-simvastatin  Other (See Comments)   Muscle  Pain   Fosamax  [alendronate Sodium] Other (See Comments)   Heartburn   Sulfa Antibiotics Itching, Rash, Other (See Comments), Hives   Other Reaction(s): ITCHING,WATERING EYES, HIVES   Bupropion  Other (See  Comments)   vertigo   Doxepin Swelling   Prazosin    Other reaction(s): Low blood pressure   Tetracyclines & Related Other (See Comments), Nausea And Vomiting   Swelling eruption of the lips        Medication List     TAKE these medications    acetaminophen  500 MG tablet Commonly known as: TYLENOL  Take 2 tablets (1,000 mg total) by mouth every 8 (eight) hours as needed for mild pain (pain score 1-3). What changed:  when to take this reasons to take this   albuterol  108 (90 Base) MCG/ACT inhaler Commonly known as: VENTOLIN  HFA Inhale 2 puffs into the lungs every 6 (six) hours as needed for wheezing or shortness of breath.   aspirin  EC 325 MG tablet Take 1 tablet (325 mg total) by mouth 2 (two) times daily after a meal. What changed:  how much to take when to take this   Calcium  Carb-Cholecalciferol  600-10 MG-MCG Tabs Take 1 tablet by mouth daily.   carvedilol  25 MG tablet Commonly known as: COREG  Take 0.5 tablets (12.5 mg total) by mouth 2 (two) times daily.   Cholecalciferol  25 MCG (1000 UT) tablet Take 1,000 Units by mouth daily.   docusate sodium  100 MG capsule Commonly known as: COLACE Take 1 capsule (100 mg total) by mouth 2 (two) times daily as needed for mild constipation.   losartan  100 MG tablet Commonly known as: COZAAR  Take 50 mg by mouth at bedtime as needed (blood pressure).   methocarbamol  500 MG tablet Commonly known as: ROBAXIN  Take 1 tablet (500 mg total) by mouth every 6 (six) hours as needed for muscle spasms.   polyethylene glycol 17 g packet Commonly known as: MIRALAX  / GLYCOLAX  Take 17 g by mouth 2 (two) times daily as needed.   sertraline  100 MG tablet Commonly known as: ZOLOFT  Take 200 mg by mouth daily.   simvastatin  40 MG tablet Commonly known as: ZOCOR  Take 40 mg by mouth at bedtime.   spironolactone  25 MG tablet Commonly known as: ALDACTONE  Take 25 mg by mouth daily.   sucralfate  1 g tablet Commonly known as:  Carafate  Take 1 tablet (1 g total) by mouth 4 (four) times daily -  with meals and at bedtime.   Tiotropium Bromide  Monohydrate 2.5 MCG/ACT Aers Take 2 puffs by mouth daily.   traMADol  50 MG tablet Commonly known as: ULTRAM  Take 1 tablet (50 mg total) by mouth every 6 (six) hours as needed.   traZODone  100 MG tablet Commonly known as: DESYREL  Take 100 mg by mouth at bedtime.   Wixela Inhub 250-50 MCG/ACT Aepb Generic drug: fluticasone -salmeterol Inhale 1 puff into the lungs 2 (two) times daily.          Follow-up Information     [facility] [place]. [facility] Follow up.   Specialty: Oncology Why: a referral has been placed for oncology follow up. They will be contacting you with an appointment. Contact information: [address] [place]  [address] [phone]        Signe Mitzie LABOR, MD. Go on 08/25/2023.   Specialty: General Surgery Why: 2/20 at 9:00 am, Please arrive 30 minutes early to complete check in, and bring photo ID  and insurance card. Contact information: [address] [place] [address] [phone]         [facility], [facility] Follow up.   Specialty: Home Health Services Why: to provide home physical therapy visits Contact information: [address] [place] [address] [phone]         [facility], [facility] Follow up on 08/17/2023.   Specialty: General Surgery Why: 10am. This is a nurse visit for staple removal. Please bring a copy of your photo ID and insurance card. Please arrive 30 minutes prior to your appointment for paperwork. Contact information: [address] [place] [address] [phone]         Beverley Louann DASEN, MD Follow up.   Specialty: Internal Medicine Why: For follow up Contact information: [address] [place] [address] [phone]                  Signed: Ozell Shaper, [facility] 08/09/2023, 11:36 AM Please see Amion for pager number during day hours 7:00am-4:30pm

## 2023-08-16 ENCOUNTER — Inpatient Hospital Stay: Payer: Non-veteran care | Attending: Oncology | Admitting: Oncology

## 2023-08-16 VITALS — BP 112/67 | HR 65 | Temp 97.8°F | Resp 18 | Ht 65.0 in | Wt 169.5 lb

## 2023-08-16 DIAGNOSIS — E785 Hyperlipidemia, unspecified: Secondary | ICD-10-CM | POA: Insufficient documentation

## 2023-08-16 DIAGNOSIS — Z9049 Acquired absence of other specified parts of digestive tract: Secondary | ICD-10-CM | POA: Insufficient documentation

## 2023-08-16 DIAGNOSIS — Z8619 Personal history of other infectious and parasitic diseases: Secondary | ICD-10-CM | POA: Insufficient documentation

## 2023-08-16 DIAGNOSIS — Z87891 Personal history of nicotine dependence: Secondary | ICD-10-CM

## 2023-08-16 DIAGNOSIS — C182 Malignant neoplasm of ascending colon: Secondary | ICD-10-CM | POA: Diagnosis not present

## 2023-08-16 DIAGNOSIS — C184 Malignant neoplasm of transverse colon: Secondary | ICD-10-CM | POA: Diagnosis not present

## 2023-08-16 DIAGNOSIS — J45909 Unspecified asthma, uncomplicated: Secondary | ICD-10-CM | POA: Insufficient documentation

## 2023-08-16 DIAGNOSIS — Z808 Family history of malignant neoplasm of other organs or systems: Secondary | ICD-10-CM

## 2023-08-16 DIAGNOSIS — R970 Elevated carcinoembryonic antigen [CEA]: Secondary | ICD-10-CM

## 2023-08-16 DIAGNOSIS — I10 Essential (primary) hypertension: Secondary | ICD-10-CM | POA: Insufficient documentation

## 2023-08-16 DIAGNOSIS — E279 Disorder of adrenal gland, unspecified: Secondary | ICD-10-CM

## 2023-08-16 DIAGNOSIS — Z860100 Personal history of colon polyps, unspecified: Secondary | ICD-10-CM | POA: Insufficient documentation

## 2023-08-16 DIAGNOSIS — Z800 Family history of malignant neoplasm of digestive organs: Secondary | ICD-10-CM | POA: Insufficient documentation

## 2023-08-16 MED ORDER — CAPECITABINE 500 MG PO TABS: ORAL_TABLET | ORAL | 0 refills | Status: DC

## 2023-08-16 NOTE — Progress Notes (Signed)
 [facility] New Patient Consult   Requesting MD: Eric Form, Pa-c [address] [place]. [address] [place],  [place] [address]   Adrienne Rogers 83 y.o.  [birth year]    Reason for Consult: Colon cancer   HPI: Adrienne Rogers developed abdominal pain beginning a few months ago.  She was seen in the emergency room 06/20/2023.  She was prescribed fate, but the pain persisted. She saw Dr. Loreta Ave.  A CT abdomen/pelvis 07/22/2023 revealed a 1.9 cm left adrenal nodule.  There was distention of the ascending and proximal transverse colon with a transition point at the distal transverse colon.  An apple core lesion was noted in this area with evidence of transmural tumor extension and several small lymph nodes in the transverse mesocolon.  No pathologic enlarged abdominal pelvic lymph nodes.  She underwent a colonoscopy by Dr. Loreta Ave on 07/27/2023.  A near obstructing tumor was noted in the mid ascending colon.  The area was biopsied and tattooed.  The pathology revealed moderately differentiated adenocarcinoma.    She was referred to Dr. Cliffton Asters and was noted to have inability to tolerate solid food.  She was admitted to the hospital.  She was taken the operating room by Dr. Doylene Canard on 08/03/2023 for exploratory laparotomy and segmental transverse colectomy.  A mass was noted in the central transverse colon.  No palpable lesions at the liver surface or peritoneum.  She reports abdominal soreness.  The preoperative abdominal pain and nausea have resolved.  She is having bowel movements.  Adrienne Rogers is here with her daughter.  She reports a history of colon polyps and last had a colonoscopy 4 years ago.  Past Medical History:  Diagnosis Date   Asthma    COPD (chronic obstructive pulmonary disease) (HCC)    Depression    Hyperlipidemia    Hypertension    PTSD (post-traumatic stress disorder)     .  G1, P1   .  H. pylori March 2023   .  History of colon polyps  Past Surgical  History:  Procedure Laterality Date   ABDOMINAL HYSTERECTOMY     1981   BACK SURGERY     ACDF 2005   BIOPSY  11/10/2021   Procedure: BIOPSY;  Surgeon: Jeani Hawking, MD;  Location: [facility];  Service: Gastroenterology;;   CHOLECYSTECTOMY     ESOPHAGOGASTRODUODENOSCOPY N/A 11/10/2021   Procedure: ESOPHAGOGASTRODUODENOSCOPY (EGD);  Surgeon: Jeani Hawking, MD;  Location: [facility];  Service: Gastroenterology;  Laterality: N/A;  IDA, Melena   PARTIAL COLECTOMY N/A 08/03/2023   Procedure: OPEN PARTIAL COLECTOMY;  Surgeon: Berna Bue, MD;  Location: [facility] ORS;  Service: General;  Laterality: N/A;   right knee arthroscopy     TOTAL KNEE ARTHROPLASTY Right 03/14/2018   TOTAL KNEE ARTHROPLASTY Right 03/14/2018   Procedure: TOTAL KNEE ARTHROPLASTY;  Surgeon: Marcene Corning, MD;  Location: [facility] OR;  Service: Orthopedics;  Laterality: Right;    .  Appendectomy   .  Cervical disc surgery  Medications: Reviewed  Allergies:  Allergies  Allergen Reactions   Lisinopril Tinitus   Ezetimibe-Simvastatin Other (See Comments)    Muscle Pain   Fosamax  [Alendronate Sodium] Other (See Comments)    Heartburn   Sulfa Antibiotics Itching, Rash, Other (See Comments) and Hives    Other Reaction(s): ITCHING,WATERING EYES, HIVES   Bupropion Other (See Comments)    vertigo   Doxepin Swelling   Prazosin     Other reaction(s): Low blood pressure   Tetracyclines &  Related Other (See Comments) and Nausea And Vomiting    Swelling eruption of the lips    Family history: Her mother had colon cancer in her 67s.  Maternal uncle had colon cancer.  Maternal aunt had "bone cancer ".  Social History:   She lives alone in [place].  She is a retired Retail buyer.  She quit smoking cigarettes in 2000.  No alcohol use.  She had a transfusion at the time of the hysterectomy in the 1980s.  No risk factor for HIV or hepatitis.  ROS:   Positives include: Abdominal pain beginning in 2024, nausea and constipation  prior to the transverse colectomy, fatigue, exertional dyspnea, blurred vision for 3 months  A complete ROS was otherwise negative.  Physical Exam:  Blood pressure 112/67, pulse 65, temperature 97.8 F (36.6 C), temperature source Temporal, resp. rate 18, height 5\' 5"  (1.651 m), weight 169 lb 8 oz (76.9 kg), SpO2 100%.  HEENT: Oropharynx without visible mass, neck without mass Lungs: Clear bilaterally Cardiac: Regular rate and rhythm Abdomen: No hepatosplenomegaly, no mass, healed midline incision with staples in place  Vascular: No leg edema Lymph nodes: No cervical, supraclavicular, axillary, or inguinal nodes Neurologic: Alert and oriented, the motor exam appears intact in the upper and lower extremities bilaterally Skin: No rash Musculoskeletal: No spine tenderness   LAB:  CBC  Lab Results  Component Value Date   WBC 6.1 08/06/2023   HGB 9.1 (L) 08/06/2023   HCT 28.4 (L) 08/06/2023   MCV 86.1 08/06/2023   PLT 279 08/06/2023   NEUTROABS 2.9 07/01/2023        CMP  Lab Results  Component Value Date   NA 140 08/06/2023   K 4.2 08/06/2023   CL 111 08/06/2023   CO2 23 08/06/2023   GLUCOSE 101 (H) 08/06/2023   BUN 17 08/06/2023   CREATININE 0.96 08/06/2023   CALCIUM 9.0 08/06/2023   PROT 7.7 08/02/2023   ALBUMIN 4.6 08/02/2023   AST 22 08/02/2023   ALT 16 08/02/2023   ALKPHOS 79 08/02/2023   BILITOT 0.7 08/02/2023   GFRNONAA 59 (L) 08/06/2023   GFRAA 45 (L) 03/09/2018     Lab Results  Component Value Date   CEA1 42.7 (H) 08/02/2023    Imaging:  As per HPI, CT images from 07/22/2023 reviewed    Assessment/Plan:   Transverse colon cancer, stage IIIb (pT3,pN1b,cM0), status post a segmental transverse colectomy 08/03/2023 CT abdomen/pelvis 07/22/2023: Proximal colonic distention with transition point in the transverse colon, 4 cm lesion of the distal transverse colon with evidence of transmural extension/serosal component, small lymph nodes in the  transverse mesocolon, indeterminate 1.9 cm left adrenal nodule Colonoscopy 07/22/2023: Mass in the ascending colon biopsy-invasive moderately differentiated adenocarcinoma CT chest 08/02/2023: No evidence of metastatic disease, indeterminate left adrenal nodule Segmental transverse colectomy and colocolonic anastomosis 08/03/2023-moderately differentiated adenocarcinoma of the transverse colon, tumor extends into pericolonic adipose tissue, lymphovascular invasion present, 2/13 nodes Elevated preoperative CEA Indeterminate left adrenal nodule on CT abdomen/pelvis 07/22/2023 History of colonic polyps Hypertension For lipidemia Asthma History of H. Pylori Family history of colon cancer   Disposition:   Ms. Bann has been diagnosed with stage III colon cancer.  She underwent a transverse colectomy 08/03/2023.  I reviewed details of the surgery pathology report with Ms. Sacra and her daughter.  We discussed the prognosis and adjuvant treatment options.  She has a significant chance of developing recurrent colon cancer over the next few years based on the tumor stage.  We discussed the decrease in recurrence rate associated with adjuvant 5-fluorouracil chemotherapy in the setting.  We specifically discussed capecitabine.  She has a good performance status.  I recommend adjuvant capecitabine chemotherapy.  We reviewed potential toxicities associated with capecitabine including the chance of nausea, mucositis, diarrhea, hematologic toxicity, rash, sun sensitivity, hyperpigmentation, hand/foot syndrome, and cardiac toxicity.  She agrees to proceed.  She was given reading materials on capecitabine today.  Mismatch repair protein and MSI testing are pending.  If she has an MSI high tumor we will change the adjuvant chemotherapy recommendation. She has a personal and family history of colon cancer.  Her family members are at increased risk of developing colorectal cancer and should receive appropriate  screening.  Ms. Slaby will return for an office and lab visit in 2 weeks.  The plan is to begin adjuvant capecitabine 09/05/2023.  She should continue colonoscopy surveillance with Dr. Marilynn Rail, MD  08/16/2023, 2:01 PM

## 2023-08-16 NOTE — Progress Notes (Signed)
Patient does not feel she needs any interventions right now. She has no plans to harm herself or anyone else.

## 2023-08-17 ENCOUNTER — Other Ambulatory Visit (HOSPITAL_COMMUNITY): Payer: Self-pay

## 2023-08-17 ENCOUNTER — Telehealth: Payer: Self-pay

## 2023-08-17 ENCOUNTER — Telehealth: Payer: Self-pay | Admitting: Pharmacist

## 2023-08-17 DIAGNOSIS — C184 Malignant neoplasm of transverse colon: Secondary | ICD-10-CM

## 2023-08-17 MED ORDER — CAPECITABINE 500 MG PO TABS
1500.0000 mg | ORAL_TABLET | Freq: Two times a day (BID) | ORAL | 0 refills | Status: DC
  Filled 2023-08-25: qty 84, 21d supply, fill #0

## 2023-08-17 NOTE — Telephone Encounter (Signed)
Clinical Pharmacist Practitioner Encounter   Received new prescription for Xeloda (capecitabine) for the treatment of stage IIIb colon cancer, planned duration of 3-6 months. Planned start 09/05/23.  BMP from 08/06/23 assessed, no relevant lab abnormalities. CrCl ~35ml/min, dose appropriate for renal impairment. Prescription dose and frequency assessed.   Current medication list in Epic reviewed, no relevant DDIs with capecitabine identified.  Evaluated chart and no patient barriers to medication adherence identified.   Prescription has been e-scribed to the [facility] for benefits analysis and approval.  Oral Oncology Clinic will continue to follow for insurance authorization, copayment issues, initial counseling and start date.  Patient agreed to treatment on 08/16/23 per MD documentation.  Remi Haggard, PharmD, BCOP, CPP Hematology/Oncology Clinical Pharmacist [facility] Oral Chemotherapy Navigation Clinic [phone]  08/17/2023 9:30 AM

## 2023-08-17 NOTE — Telephone Encounter (Signed)
Oral Oncology Patient Advocate Encounter  After completing a benefits investigation, prior authorization for Capecitabine is not required at this time through [employer] .  Patient's copay is $80.00.    [facility] price would be $60.00.  If patient opts to use insurance pricing, they will need to transfer refills to [facility] as their insurance will only allow 1 fill at an outside retail pharmacy.      Ardeen Fillers, CPhT Oncology Pharmacy Patient Advocate  [facility]  [phone] (phone) [phone] (fax) 08/17/2023 1:31 PM

## 2023-08-18 ENCOUNTER — Emergency Department (HOSPITAL_BASED_OUTPATIENT_CLINIC_OR_DEPARTMENT_OTHER): Payer: Non-veteran care

## 2023-08-18 ENCOUNTER — Encounter (HOSPITAL_BASED_OUTPATIENT_CLINIC_OR_DEPARTMENT_OTHER): Payer: Self-pay | Admitting: Emergency Medicine

## 2023-08-18 ENCOUNTER — Emergency Department (HOSPITAL_BASED_OUTPATIENT_CLINIC_OR_DEPARTMENT_OTHER)
Admission: EM | Admit: 2023-08-18 | Discharge: 2023-08-18 | Disposition: A | Discharge status: Home / Self Care | Payer: Non-veteran care

## 2023-08-18 DIAGNOSIS — Z85038 Personal history of other malignant neoplasm of large intestine: Secondary | ICD-10-CM | POA: Insufficient documentation

## 2023-08-18 DIAGNOSIS — R519 Headache, unspecified: Secondary | ICD-10-CM | POA: Insufficient documentation

## 2023-08-18 DIAGNOSIS — R42 Dizziness and giddiness: Secondary | ICD-10-CM | POA: Insufficient documentation

## 2023-08-18 DIAGNOSIS — W06XXXA Fall from bed, initial encounter: Secondary | ICD-10-CM | POA: Insufficient documentation

## 2023-08-18 DIAGNOSIS — Z7982 Long term (current) use of aspirin: Secondary | ICD-10-CM | POA: Insufficient documentation

## 2023-08-18 DIAGNOSIS — W19XXXA Unspecified fall, initial encounter: Secondary | ICD-10-CM

## 2023-08-18 LAB — COMPREHENSIVE METABOLIC PANEL
ALT: 15 U/L (ref 0–44)
AST: 20 U/L (ref 15–41)
Albumin: 4.2 g/dL (ref 3.5–5.0)
Alkaline Phosphatase: 75 U/L (ref 38–126)
Anion gap: 9 (ref 5–15)
BUN: 16 mg/dL (ref 8–23)
CO2: 24 mmol/L (ref 22–32)
Calcium: 9.5 mg/dL (ref 8.9–10.3)
Chloride: 109 mmol/L (ref 98–111)
Creatinine, Ser: 1.17 mg/dL — ABNORMAL HIGH (ref 0.44–1.00)
GFR, Estimated: 47 mL/min — ABNORMAL LOW (ref >60)
Glucose, Bld: 103 mg/dL — ABNORMAL HIGH (ref 70–99)
Potassium: 4.5 mmol/L (ref 3.5–5.1)
Sodium: 142 mmol/L (ref 135–145)
Total Bilirubin: 0.3 mg/dL (ref 0.0–1.2)
Total Protein: 7.5 g/dL (ref 6.5–8.1)

## 2023-08-18 LAB — CBC WITH DIFFERENTIAL/PLATELET
Abs Immature Granulocytes: 0.02 10*3/uL (ref 0.00–0.07)
Basophils Absolute: 0.1 10*3/uL (ref 0.0–0.1)
Basophils Relative: 1 %
Eosinophils Absolute: 0.1 10*3/uL (ref 0.0–0.5)
Eosinophils Relative: 2 %
HCT: 31.2 % — ABNORMAL LOW (ref 36.0–46.0)
Hemoglobin: 9.7 g/dL — ABNORMAL LOW (ref 12.0–15.0)
Immature Granulocytes: 0 %
Lymphocytes Relative: 26 %
Lymphs Abs: 1.3 10*3/uL (ref 0.7–4.0)
MCH: 26.4 pg (ref 26.0–34.0)
MCHC: 31.1 g/dL (ref 30.0–36.0)
MCV: 85 fL (ref 80.0–100.0)
Monocytes Absolute: 0.4 10*3/uL (ref 0.1–1.0)
Monocytes Relative: 9 %
Neutro Abs: 3 10*3/uL (ref 1.7–7.7)
Neutrophils Relative %: 62 %
Platelets: 401 10*3/uL — ABNORMAL HIGH (ref 150–400)
RBC: 3.67 MIL/uL — ABNORMAL LOW (ref 3.87–5.11)
RDW: 16 % — ABNORMAL HIGH (ref 11.5–15.5)
WBC: 4.9 10*3/uL (ref 4.0–10.5)
nRBC: 0 % (ref 0.0–0.2)

## 2023-08-18 NOTE — ED Triage Notes (Signed)
Pt reports falling out of bed on Monday. Pt c/o dizziness, weakness, frontal HA, LT side back pain and LT knee pain.. Pt receives PT at home. Dizziness when standing. AOx4, pt ambulatory. Post colectomy surgery 1/29

## 2023-08-18 NOTE — Discharge Instructions (Addendum)
He was in the emergency department today for concerns of a fall.  Thankfully your CT scan of your head was negative.  He has a basic labs checked on you to ensure no evidence of significant blood loss from your recent colectomy.  Hemoglobin level has been stable.  I would recommend following up with your primary care provider for repeat evaluation.  For any concerns of new or worsening symptoms, return the emergency department.

## 2023-08-18 NOTE — ED Provider Notes (Signed)
 [place] EMERGENCY DEPARTMENT AT [facility] Provider Note   CSN: [id number] Arrival date & time: 08/18/23  1313     History No chief complaint on file.   Adrienne Rogers is a 83 y.o. female.  Patient past history significant for colon cancer, orthostatic hypotension presents the emergency department concerns of a fall.  States that she had a mechanical fall when she fell out of her bed on Monday evening.  She states she has a history of PTSD and was having a dream in which she was in [place] and it caused her to attempt to fight and she ended up falling out of her bed.  She states that she hit the right frontal aspect of her forehead.  Endorsing some dizziness and frontal headache since then.  Not on blood thinners.  Regarding the dizziness, she does report that she has a baseline history of orthostatic hypotension and typically notices worsening dizziness when trying to stand.  No recent vomiting, diarrhea, or poor appetite.  Did have surgery on 08/03/2023 for colectomy for history of colon cancer.  She denies any obvious signs of GI bleeding such as hematemesis, hematochezia, or melanotic stools.  HPI     Home Medications Prior to Admission medications   Medication Sig Start Date End Date Taking? Authorizing Provider  acetaminophen (TYLENOL) 500 MG tablet Take 2 tablets (1,000 mg total) by mouth every 8 (eight) hours as needed for mild pain (pain score 1-3). 08/09/23   Maczis, Elmer Sow, PA-C  albuterol (VENTOLIN HFA) 108 (90 Base) MCG/ACT inhaler Inhale 2 puffs into the lungs every 6 (six) hours as needed for wheezing or shortness of breath. 08/09/23   Maczis, Elmer Sow, PA-C  aspirin EC 81 MG tablet Take 81 mg by mouth daily. Swallow whole.    [provider]  capecitabine (XELODA) 500 MG tablet Take 3 tablets (1,500 mg total) by mouth 2 (two) times daily after a meal. Take for 14 days, then hold for 7 days. Repeat every 21 days. 08/17/23   Ladene Artist, MD   carvedilol (COREG) 25 MG tablet Take 0.5 tablets (12.5 mg total) by mouth 2 (two) times daily. 11/10/21   [name], MD  Cholecalciferol 25 MCG (1000 UT) tablet Take 1,000 Units by mouth daily. 01/10/21   [provider]  docusate sodium (COLACE) 100 MG capsule Take 1 capsule (100 mg total) by mouth 2 (two) times daily as needed for mild constipation. 08/09/23   Maczis, Elmer Sow, PA-C  losartan (COZAAR) 100 MG tablet Take 50 mg by mouth at bedtime as needed (blood pressure). 11/04/21   [provider]  methocarbamol (ROBAXIN) 500 MG tablet Take 1 tablet (500 mg total) by mouth every 6 (six) hours as needed for muscle spasms. 08/09/23   Maczis, Elmer Sow, PA-C  polyethylene glycol (MIRALAX / GLYCOLAX) 17 g packet Take 17 g by mouth 2 (two) times daily as needed. 08/09/23   Maczis, Elmer Sow, PA-C  sertraline (ZOLOFT) 100 MG tablet Take 200 mg by mouth daily. 10/02/21   [provider]  simvastatin (ZOCOR) 40 MG tablet Take 40 mg by mouth at bedtime.     [provider]  spironolactone (ALDACTONE) 25 MG tablet Take 25 mg by mouth daily. 09/20/21   [provider]  Tiotropium Bromide Monohydrate 2.5 MCG/ACT AERS Take 2 puffs by mouth daily. 08/13/22   [provider]  traMADol (ULTRAM) 50 MG tablet Take 1 tablet (50 mg total) by mouth every 6 (  six) hours as needed. 08/09/23   Maczis, Elmer Sow, PA-C  traZODone (DESYREL) 100 MG tablet Take 100 mg by mouth at bedtime.    [provider]  Monte Fantasia INHUB 250-50 MCG/ACT AEPB Inhale 1 puff into the lungs 2 (two) times daily. 09/19/21   [provider]      Allergies    Lisinopril, Ezetimibe-simvastatin, Fosamax  [alendronate sodium], Sulfa antibiotics, Bupropion, Doxepin, Prazosin, and Tetracyclines & related    Review of Systems   Review of Systems  Neurological:  Positive for dizziness and headaches.  All other systems reviewed and are negative.   Physical Exam Updated Vital Signs BP (!)  156/79 (BP Location: Left Arm)   Pulse 62   Temp 98.5 F (36.9 C) (Oral)   Resp 18   Wt 75.8 kg   SpO2 98%   BMI 27.79 kg/m  Physical Exam Vitals and nursing note reviewed.  Constitutional:      General: She is not in acute distress.    Appearance: She is well-developed.  HENT:     Head: Normocephalic and atraumatic.  Eyes:     General:        Right eye: No discharge.        Left eye: No discharge.     Extraocular Movements: Extraocular movements intact.     Conjunctiva/sclera: Conjunctivae normal.     Pupils: Pupils are equal, round, and reactive to light.  Cardiovascular:     Rate and Rhythm: Normal rate and regular rhythm.     Heart sounds: No murmur heard. Pulmonary:     Effort: Pulmonary effort is normal. No respiratory distress.     Breath sounds: Normal breath sounds.  Abdominal:     Palpations: Abdomen is soft.     Tenderness: There is no abdominal tenderness.  Musculoskeletal:        General: No swelling.     Cervical back: Neck supple.  Skin:    General: Skin is warm and dry.     Capillary Refill: Capillary refill takes less than 2 seconds.  Neurological:     Mental Status: She is alert.  Psychiatric:        Mood and Affect: Mood normal.     ED Results / Procedures / Treatments   Labs (all labs ordered are listed, but only abnormal results are displayed) Labs Reviewed  CBC WITH DIFFERENTIAL/PLATELET - Abnormal; Notable for the following components:      Result Value   RBC 3.67 (*)    Hemoglobin 9.7 (*)    HCT 31.2 (*)    RDW 16.0 (*)    Platelets 401 (*)    All other components within normal limits  COMPREHENSIVE METABOLIC PANEL - Abnormal; Notable for the following components:   Glucose, Bld 103 (*)    Creatinine, Ser 1.17 (*)    GFR, Estimated 47 (*)    All other components within normal limits    EKG EKG Interpretation Date/Time:  Thursday August 18 2023 13:31:27 EST Ventricular Rate:  66 PR Interval:  184 QRS Duration:  82 QT  Interval:  354 QTC Calculation: 371 R Axis:   47  Text Interpretation: Normal sinus rhythm ST & T wave abnormality, consider inferior ischemia Abnormal ECG When compared with ECG of 01-Jul-2023 13:16, T wave inversion now evident in Inferior leads QT has shortened Confirmed by Estanislado Pandy [phone]) on 08/18/2023 2:33:00 PM  Radiology CT Head Wo Contrast Result Date: 08/18/2023 CLINICAL DATA:  Head trauma fell out of  bed disease EXAM: CT HEAD WITHOUT CONTRAST TECHNIQUE: Contiguous axial images were obtained from the base of the skull through the vertex without intravenous contrast. RADIATION DOSE REDUCTION: This exam was performed according to the departmental dose-optimization program which includes automated exposure control, adjustment of the mA and/or kV according to patient size and/or use of iterative reconstruction technique. COMPARISON:  CT brain 02/10/2004 FINDINGS: Brain: No acute territorial infarction, hemorrhage or intracranial mass. Mild atrophy. Mild white matter hypodensity likely chronic small vessel ischemic change. The ventricles are nonenlarged Vascular: No hyperdense vessels.  Carotid vascular calcification Skull: Normal. Negative for fracture or focal lesion. Sinuses/Orbits: No acute finding. Other: None IMPRESSION: 1. No CT evidence for acute intracranial abnormality. 2. Mild atrophy and chronic small vessel ischemic changes of the white matter. Electronically Signed   By: Jasmine Pang M.D.   On: 08/18/2023 16:21    Procedures Procedures   Medications Ordered in ED Medications - No data to display  ED Course/ Medical Decision Making/ A&P                                 Medical Decision Making Amount and/or Complexity of Data Reviewed Labs: ordered. Radiology: ordered.   This patient presents to the ED for concern of fall. Differential diagnosis includes SAH, concussion, orthostatic hypotension, vertigo   Lab Tests:  I Ordered, and personally interpreted labs.   The pertinent results include: CBC shows stable hemoglobin, CMP with baseline kidney function with creatinine 1.17 and GFR 47   Imaging Studies ordered:  I ordered imaging studies including CT head I independently visualized and interpreted imaging which showed no acute findings I agree with the radiologist interpretation   Problem List / ED Course:  Patient past history significant for PTSD, colon cancer, orthostatic hypotension presents emergency department concerns of a fall.  She reports that she fell out of her bed on Monday evening after she had a dream in which she was trying to fight.  She states that she was alert and oriented immediately after finding her self on the ground.  States that she struck the right frontal aspect of her head.  Mild headache towards this area as well.  Denies any lacerations or lesions from the fall.  Not on blood thinners. Physical exam reveals that patient's eyes are PERRL.  EOMs intact.  Mild swelling to the right frontal forehead.  No appreciable bruising.  No skull fracture palpated.  Did not attempt orthostatics on patient as I was in the room alone with her and will have the nurse help with this to ensure patient's safety.  Patient did not have any worsening dizziness with lying to sitting so it does appear that most of the symptoms are from sitting/lying to standing.  Patient is not acutely hypotensive or tachycardic so we will hold off on fluids.  Will obtain CT imaging of the head and recheck basic labs to ensure no concerning signs for bleeding. Orthostatic vitals unremarkable. Lab workup shows no evidence of blood loss given recent colectomy.  CMP is also unremarkable for AKI or other renal dysfunction.  No significant electrolyte changes.  CT head is also negative for any acute injuries.  Thankfully patient's workup is reassuring.  Informed patient and plan for outpatient follow-up and discharged home. Informed patient of findings and answered all  questions.  No other acute findings or concerns at this time requiring more emergent workup.  Patient stable discharge  with outpatient follow-up planned.   Social Determinants of Health:  Known cancer history Recent surgery Veteran with PTSD  Final Clinical Impression(s) / ED Diagnoses Final diagnoses:  Fall, initial encounter    Rx / DC Orders ED Discharge Orders     None         Smitty Knudsen, PA-C 08/18/23 1749    Coral Spikes, DO 08/19/23 [phone]

## 2023-08-19 ENCOUNTER — Encounter: Payer: Self-pay | Admitting: *Deleted

## 2023-08-19 LAB — SURGICAL PATHOLOGY

## 2023-08-19 NOTE — Telephone Encounter (Signed)
Oral Oncology Patient Advocate Encounter  Spoke with the patient regarding cost. Patient prefers to wait until care in the community [facility] so [facility].  Alyson aware.  Jinger Neighbors, CPhT-Adv Oncology Pharmacy Patient Advocate [facility] Direct Number: [phone]  Fax: [phone]

## 2023-08-19 NOTE — Progress Notes (Signed)
Continuity of Community Care [place] form [id number] sent to [facility] via email portal for Capecitabine

## 2023-08-22 ENCOUNTER — Telehealth: Payer: Self-pay | Admitting: *Deleted

## 2023-08-22 NOTE — Telephone Encounter (Signed)
 Patient wants to wait on getting a referral from the [place] so her medication can be filled there. Things with capecitabine are on hold until the referral is obtained. Once that is complete the rx can be redirected to the [facility] pharmacy.

## 2023-08-22 NOTE — Telephone Encounter (Signed)
 Called to request we fax last office to her PCP, Dr. Clifton Custard and provided fax [phone] and [phone]. Routed 08/16/23 office note via EPIC to PCP>

## 2023-08-25 ENCOUNTER — Other Ambulatory Visit: Payer: Self-pay

## 2023-08-25 ENCOUNTER — Other Ambulatory Visit (HOSPITAL_COMMUNITY): Payer: Self-pay

## 2023-08-25 ENCOUNTER — Telehealth: Payer: Self-pay

## 2023-08-25 NOTE — Progress Notes (Signed)
 Patient education documented in EPIC note on 08/25/23.

## 2023-08-25 NOTE — Telephone Encounter (Addendum)
 Patient successfully OnBoarded and drug education provided by pharmacist. Medication scheduled to be shipped on Friday, 08/26/23, for delivery on Monday, 08/29/23, from [facility] to patient's address. Patient also knows to call me at [phone] with any questions or concerns regarding receiving medication or if there is any unexpected change in co-pay.   Patient will be using [facility] Price of $60.00 for Capecitabine.   Ardeen Fillers, CPhT Oncology Pharmacy Patient Advocate  [facility]  [phone] (phone) [phone] (fax) 08/25/2023 2:40 PM

## 2023-08-25 NOTE — Telephone Encounter (Signed)
 Clinical Pharmacist Practitioner Encounter   [facility] (Specialty) to deliver medication to patient on 08/29/23, she knows the plan is to start on 09/05/23.   Patient Education I spoke with patient for overview of new oral chemotherapy medication: Xeloda (capecitabine) for the treatment of stage IIIb colon cancer, planned duration of 3-6 months. Planned start 09/05/23.   Counseled patient on administration, dosing, side effects, monitoring, drug-food interactions, safe handling, storage, and disposal. Patient will take 3 tablets (1,500 mg total) by mouth 2 (two) times daily after a meal. Take for 14 days, then hold for 7 days. Repeat every 21 days.   Side effects include but not limited to: diarrhea, hand-foot syndrome, mouth sores, edema, decreased wbc, fatigue, N/V Diarrhea: Patient knows to use loperamide as needed and call the office if they are having 4 or more loose stools per day. Hand-foot syndrome: Recommended the use of [place] Smooth Extra Care 20. Patient knows to report any skin changes they notice.  Mouth sores: Patient knows to request magic mouthwash if needed.    Reviewed with patient importance of keeping a medication schedule and plan for any missed doses.  After discussion with patient no patient barriers to medication adherence identified.   Ms. Paradiso voiced understanding and appreciation. All questions answered. Medication handout provided.  Provided patient with [facility] phone number. Patient knows to call the office with questions or concerns. Oral [facility]otherapy [facility] will continue to follow.  Remi Haggard, PharmD, BCOP, CPP Hematology/Oncology Clinical Pharmacist [facility] Oral Chemotherapy Navigation Clinic [phone]  08/25/2023 2:28 PM

## 2023-08-25 NOTE — Telephone Encounter (Signed)
 The patient has reached out regarding assistance with obtaining coverage for her Xeloda medication. She mentioned that the [facility] is unable to cover the cost of the medication and indicated that if the [place] were to provide coverage, she would need to change her healthcare provider. I contacted Alyson to help with the patient concerns.

## 2023-08-25 NOTE — Progress Notes (Signed)
 Specialty Pharmacy Initial Fill Coordination Note  Adrienne Rogers is a 83 y.o. female contacted today regarding initial fill of specialty medication(s) Capecitabine Lorri Frederick)  Patient requested Delivery   Delivery date: 08/29/23   Verified address: [address], [place], [place] [address]  Medication will be filled on 08/26/23.   Patient is aware of $60.00 copayment. Using [facility] Price of $60.00. CC info sent.    Ardeen Fillers, CPhT Oncology Pharmacy Patient Advocate  [facility]  [phone] (phone) [phone] (fax) 08/25/2023 2:38 PM

## 2023-08-26 ENCOUNTER — Other Ambulatory Visit: Payer: Self-pay

## 2023-08-26 LAB — MOLECULAR PATHOLOGY

## 2023-08-31 ENCOUNTER — Inpatient Hospital Stay: Payer: Medicare Other

## 2023-08-31 ENCOUNTER — Inpatient Hospital Stay (HOSPITAL_BASED_OUTPATIENT_CLINIC_OR_DEPARTMENT_OTHER): Payer: Medicare Other | Admitting: Oncology

## 2023-08-31 VITALS — BP 109/61 | HR 76 | Temp 98.1°F | Resp 18 | Ht 65.0 in | Wt 171.3 lb

## 2023-08-31 DIAGNOSIS — Z9049 Acquired absence of other specified parts of digestive tract: Secondary | ICD-10-CM | POA: Diagnosis not present

## 2023-08-31 DIAGNOSIS — Z8619 Personal history of other infectious and parasitic diseases: Secondary | ICD-10-CM | POA: Diagnosis not present

## 2023-08-31 DIAGNOSIS — Z800 Family history of malignant neoplasm of digestive organs: Secondary | ICD-10-CM | POA: Diagnosis not present

## 2023-08-31 DIAGNOSIS — E279 Disorder of adrenal gland, unspecified: Secondary | ICD-10-CM | POA: Diagnosis not present

## 2023-08-31 DIAGNOSIS — C184 Malignant neoplasm of transverse colon: Secondary | ICD-10-CM

## 2023-08-31 DIAGNOSIS — I10 Essential (primary) hypertension: Secondary | ICD-10-CM

## 2023-08-31 DIAGNOSIS — Z860100 Personal history of colon polyps, unspecified: Secondary | ICD-10-CM | POA: Diagnosis not present

## 2023-08-31 DIAGNOSIS — J45909 Unspecified asthma, uncomplicated: Secondary | ICD-10-CM

## 2023-08-31 DIAGNOSIS — Z83719 Family history of colon polyps, unspecified: Secondary | ICD-10-CM

## 2023-08-31 DIAGNOSIS — E785 Hyperlipidemia, unspecified: Secondary | ICD-10-CM | POA: Diagnosis not present

## 2023-08-31 LAB — CBC WITH DIFFERENTIAL (CANCER CENTER ONLY)
Abs Immature Granulocytes: 0.01 10*3/uL (ref 0.00–0.07)
Basophils Absolute: 0 10*3/uL (ref 0.0–0.1)
Basophils Relative: 1 %
Eosinophils Absolute: 0.2 10*3/uL (ref 0.0–0.5)
Eosinophils Relative: 5 %
HCT: 34.1 % — ABNORMAL LOW (ref 36.0–46.0)
Hemoglobin: 10.6 g/dL — ABNORMAL LOW (ref 12.0–15.0)
Immature Granulocytes: 0 %
Lymphocytes Relative: 35 %
Lymphs Abs: 1.4 10*3/uL (ref 0.7–4.0)
MCH: 27 pg (ref 26.0–34.0)
MCHC: 31.1 g/dL (ref 30.0–36.0)
MCV: 87 fL (ref 80.0–100.0)
Monocytes Absolute: 0.4 10*3/uL (ref 0.1–1.0)
Monocytes Relative: 10 %
Neutro Abs: 1.9 10*3/uL (ref 1.7–7.7)
Neutrophils Relative %: 49 %
Platelet Count: 245 10*3/uL (ref 150–400)
RBC: 3.92 MIL/uL (ref 3.87–5.11)
RDW: 17.9 % — ABNORMAL HIGH (ref 11.5–15.5)
WBC Count: 3.9 10*3/uL — ABNORMAL LOW (ref 4.0–10.5)
nRBC: 0 % (ref 0.0–0.2)

## 2023-08-31 LAB — CMP (CANCER CENTER ONLY)
ALT: 17 U/L (ref 0–44)
AST: 15 U/L (ref 15–41)
Albumin: 4.7 g/dL (ref 3.5–5.0)
Alkaline Phosphatase: 78 U/L (ref 38–126)
Anion gap: 8 (ref 5–15)
BUN: 17 mg/dL (ref 8–23)
CO2: 26 mmol/L (ref 22–32)
Calcium: 10 mg/dL (ref 8.9–10.3)
Chloride: 109 mmol/L (ref 98–111)
Creatinine: 1.32 mg/dL — ABNORMAL HIGH (ref 0.44–1.00)
GFR, Estimated: 41 mL/min — ABNORMAL LOW (ref >60)
Glucose, Bld: 113 mg/dL — ABNORMAL HIGH (ref 70–99)
Potassium: 4.3 mmol/L (ref 3.5–5.1)
Sodium: 143 mmol/L (ref 135–145)
Total Bilirubin: 0.3 mg/dL (ref 0.0–1.2)
Total Protein: 7.2 g/dL (ref 6.5–8.1)

## 2023-08-31 LAB — CEA (ACCESS): CEA (CHCC): 4.56 ng/mL (ref 0.00–5.00)

## 2023-08-31 NOTE — Progress Notes (Signed)
  [facility] OFFICE PROGRESS NOTE   Diagnosis: Colon cancer  INTERVAL HISTORY:   Adrienne Rogers returns as scheduled.  She feels well.  She has filled the Xeloda prescription.  She has noticed a remaining suture at the umbilicus.  She is scheduled see Dr. Fredricka Bonine today.  Objective:  Vital signs in last 24 hours:  Blood pressure 109/61, pulse 76, temperature 98.1 F (36.7 C), temperature source Temporal, resp. rate 18, height 5\' 5"  (1.651 m), weight 171 lb 4.8 oz (77.7 kg), SpO2 100%.    Resp: Lungs clear bilaterally Cardio: Regular rhythm with premature beats GI: No hepatosplenomegaly, nontender, healed midline incision.  Staple in place at the umbilicus Vascular: No leg edema   Lab Results:  Lab Results  Component Value Date   WBC 3.9 (L) 08/31/2023   HGB 10.6 (L) 08/31/2023   HCT 34.1 (L) 08/31/2023   MCV 87.0 08/31/2023   PLT 245 08/31/2023   NEUTROABS 1.9 08/31/2023    CMP  Lab Results  Component Value Date   NA 143 08/31/2023   K 4.3 08/31/2023   CL 109 08/31/2023   CO2 26 08/31/2023   GLUCOSE 113 (H) 08/31/2023   BUN 17 08/31/2023   CREATININE 1.32 (H) 08/31/2023   CALCIUM 10.0 08/31/2023   PROT 7.2 08/31/2023   ALBUMIN 4.7 08/31/2023   AST 15 08/31/2023   ALT 17 08/31/2023   ALKPHOS 78 08/31/2023   BILITOT 0.3 08/31/2023   GFRNONAA 41 (L) 08/31/2023   GFRAA 45 (L) 03/09/2018    Lab Results  Component Value Date   CEA1 42.7 (H) 08/02/2023     Medications: I have reviewed the patient's current medications.   Assessment/Plan: Transverse colon cancer, stage IIIb (pT3,pN1b,cM0), status post a segmental transverse colectomy 08/03/2023 CT abdomen/pelvis 07/22/2023: Proximal colonic distention with transition point in the transverse colon, 4 cm lesion of the distal transverse colon with evidence of transmural extension/serosal component, small lymph nodes in the transverse mesocolon, indeterminate 1.9 cm left adrenal nodule Colonoscopy  07/22/2023: Mass in the ascending colon biopsy-invasive moderately differentiated adenocarcinoma CT chest 08/02/2023: No evidence of metastatic disease, indeterminate left adrenal nodule Segmental transverse colectomy and colocolonic anastomosis 08/03/2023-moderately differentiated adenocarcinoma of the transverse colon, tumor extends into pericolonic adipose tissue, lymphovascular invasion present, 2/13 nodes Mismatch repair protein expression intact, MSI stable Elevated preoperative CEA Indeterminate left adrenal nodule on CT abdomen/pelvis 07/22/2023 History of colonic polyps Hypertension For lipidemia Asthma History of H. Pylori Family history of colon cancer     Disposition: Adrienne Rogers has been diagnosed with stage III colon cancer.  She has recovered from surgery.  The tumor is microsatellite stable.  I recommend adjuvant capecitabine.  We reviewed potential toxicities associated with capecitabine again today.  She agrees to proceed.  She will begin cycle 1 capecitabine on 09/08/2023.  She will return for an office and lab visit on 09/27/2023.  We will follow-up on the CEA from today.  Thornton Papas, MD  08/31/2023  11:37 AM

## 2023-09-06 ENCOUNTER — Other Ambulatory Visit (HOSPITAL_COMMUNITY): Payer: Self-pay

## 2023-09-16 ENCOUNTER — Other Ambulatory Visit: Payer: Self-pay

## 2023-09-19 ENCOUNTER — Other Ambulatory Visit (HOSPITAL_COMMUNITY): Payer: Self-pay

## 2023-09-19 ENCOUNTER — Encounter: Payer: Self-pay | Admitting: Gastroenterology

## 2023-09-19 ENCOUNTER — Other Ambulatory Visit: Payer: Self-pay | Admitting: Oncology

## 2023-09-19 DIAGNOSIS — C184 Malignant neoplasm of transverse colon: Secondary | ICD-10-CM

## 2023-09-19 MED ORDER — CAPECITABINE 500 MG PO TABS
1500.0000 mg | ORAL_TABLET | Freq: Two times a day (BID) | ORAL | 0 refills | Status: DC
Start: 2023-09-19 — End: 2023-09-29
  Filled 2023-09-20: qty 84, 21d supply, fill #0

## 2023-09-19 NOTE — Progress Notes (Signed)
 Specialty Pharmacy Refill Coordination Note  Adrienne Rogers is a 83 y.o. female contacted today regarding refills of specialty medication(s) Capecitabine Lorri Frederick)   Patient requested Delivery   Delivery date: 09/27/23   Verified address: [address]  [place]  [address]   Medication will be filled on 09/26/23. This fill date is pending response to refill request from provider. Patient is aware and if they have not received fill by intended date they must follow up with pharmacy.

## 2023-09-19 NOTE — Progress Notes (Signed)
 Specialty Pharmacy Ongoing Clinical Assessment Note  Adrienne Rogers is a 83 y.o. female who is being followed by the specialty pharmacy service for RxSp Oncology   Patient's specialty medication(s) reviewed today: Capecitabine (XELODA)   Missed doses in the last 4 weeks: 0   Patient/Caregiver did not have any additional questions or concerns.   Therapeutic benefit summary: Patient is achieving benefit   Adverse events/side effects summary: Experienced adverse events/side effects (dizziness, discussed remaining well hydrated, getting up slowly,etc; pt will also discuss with provider at upcoming appt)   Patient's therapy is appropriate to: Continue    Goals Addressed             This Visit's Progress    Achieve a cure   On track    Patient is initiating therapy. Patient will maintain adherence         Follow up:  3 months  Servando Snare Specialty Pharmacist

## 2023-09-20 ENCOUNTER — Other Ambulatory Visit: Payer: Self-pay

## 2023-09-20 ENCOUNTER — Other Ambulatory Visit (HOSPITAL_COMMUNITY): Payer: Self-pay

## 2023-09-26 ENCOUNTER — Other Ambulatory Visit: Payer: Self-pay

## 2023-09-27 ENCOUNTER — Encounter: Payer: Self-pay | Admitting: Nurse Practitioner

## 2023-09-27 ENCOUNTER — Inpatient Hospital Stay (HOSPITAL_BASED_OUTPATIENT_CLINIC_OR_DEPARTMENT_OTHER): Payer: Federal, State, Local not specified - PPO | Admitting: Nurse Practitioner

## 2023-09-27 ENCOUNTER — Inpatient Hospital Stay: Payer: Federal, State, Local not specified - PPO | Attending: Oncology

## 2023-09-27 VITALS — BP 109/67 | HR 62 | Temp 98.2°F | Resp 16 | Ht 65.0 in | Wt 172.6 lb

## 2023-09-27 DIAGNOSIS — I10 Essential (primary) hypertension: Secondary | ICD-10-CM | POA: Insufficient documentation

## 2023-09-27 DIAGNOSIS — J45909 Unspecified asthma, uncomplicated: Secondary | ICD-10-CM | POA: Diagnosis not present

## 2023-09-27 DIAGNOSIS — C184 Malignant neoplasm of transverse colon: Secondary | ICD-10-CM

## 2023-09-27 DIAGNOSIS — Z800 Family history of malignant neoplasm of digestive organs: Secondary | ICD-10-CM

## 2023-09-27 DIAGNOSIS — Z83719 Family history of colon polyps, unspecified: Secondary | ICD-10-CM

## 2023-09-27 DIAGNOSIS — Z8619 Personal history of other infectious and parasitic diseases: Secondary | ICD-10-CM | POA: Insufficient documentation

## 2023-09-27 DIAGNOSIS — Z9049 Acquired absence of other specified parts of digestive tract: Secondary | ICD-10-CM | POA: Diagnosis not present

## 2023-09-27 DIAGNOSIS — Z860100 Personal history of colon polyps, unspecified: Secondary | ICD-10-CM | POA: Diagnosis not present

## 2023-09-27 DIAGNOSIS — E279 Disorder of adrenal gland, unspecified: Secondary | ICD-10-CM | POA: Diagnosis not present

## 2023-09-27 DIAGNOSIS — E785 Hyperlipidemia, unspecified: Secondary | ICD-10-CM

## 2023-09-27 LAB — CBC WITH DIFFERENTIAL (CANCER CENTER ONLY)
Abs Immature Granulocytes: 0.01 10*3/uL (ref 0.00–0.07)
Basophils Absolute: 0 10*3/uL (ref 0.0–0.1)
Basophils Relative: 0 %
Eosinophils Absolute: 0.1 10*3/uL (ref 0.0–0.5)
Eosinophils Relative: 2 %
HCT: 33.2 % — ABNORMAL LOW (ref 36.0–46.0)
Hemoglobin: 10.7 g/dL — ABNORMAL LOW (ref 12.0–15.0)
Immature Granulocytes: 0 %
Lymphocytes Relative: 27 %
Lymphs Abs: 1.3 10*3/uL (ref 0.7–4.0)
MCH: 27.9 pg (ref 26.0–34.0)
MCHC: 32.2 g/dL (ref 30.0–36.0)
MCV: 86.5 fL (ref 80.0–100.0)
Monocytes Absolute: 0.4 10*3/uL (ref 0.1–1.0)
Monocytes Relative: 9 %
Neutro Abs: 2.9 10*3/uL (ref 1.7–7.7)
Neutrophils Relative %: 62 %
Platelet Count: 248 10*3/uL (ref 150–400)
RBC: 3.84 MIL/uL — ABNORMAL LOW (ref 3.87–5.11)
RDW: 21.8 % — ABNORMAL HIGH (ref 11.5–15.5)
WBC Count: 4.8 10*3/uL (ref 4.0–10.5)
nRBC: 0 % (ref 0.0–0.2)

## 2023-09-27 LAB — CMP (CANCER CENTER ONLY)
ALT: 13 U/L (ref 0–44)
AST: 14 U/L — ABNORMAL LOW (ref 15–41)
Albumin: 4.4 g/dL (ref 3.5–5.0)
Alkaline Phosphatase: 84 U/L (ref 38–126)
Anion gap: 9 (ref 5–15)
BUN: 19 mg/dL (ref 8–23)
CO2: 27 mmol/L (ref 22–32)
Calcium: 9.8 mg/dL (ref 8.9–10.3)
Chloride: 105 mmol/L (ref 98–111)
Creatinine: 1.28 mg/dL — ABNORMAL HIGH (ref 0.44–1.00)
GFR, Estimated: 42 mL/min — ABNORMAL LOW (ref >60)
Glucose, Bld: 101 mg/dL — ABNORMAL HIGH (ref 70–99)
Potassium: 4.1 mmol/L (ref 3.5–5.1)
Sodium: 141 mmol/L (ref 135–145)
Total Bilirubin: 0.4 mg/dL (ref 0.0–1.2)
Total Protein: 7 g/dL (ref 6.5–8.1)

## 2023-09-27 NOTE — Progress Notes (Signed)
  [facility] OFFICE PROGRESS NOTE   Diagnosis: Colon cancer  INTERVAL HISTORY:   Adrienne Rogers returns as scheduled.  She began cycle 1 adjuvant capecitabine 09/08/2023.  She reports chronic morning nausea.  She had no nausea after taking the chemotherapy pills.  No mouth sores.  No watery bowel movements.  She tends to have 1-2 soft stools a day.  No hand or foot pain or redness.  Objective:  Vital signs in last 24 hours:  Blood pressure 109/67, pulse 62, temperature 98.2 F (36.8 C), temperature source Temporal, resp. rate 16, height 5\' 5"  (1.651 m), weight 172 lb 9.6 oz (78.3 kg), SpO2 98%.    HEENT: No thrush or ulcers. Resp: Lungs clear bilaterally. Cardio: Regular rate and rhythm. GI: Healed midline surgical incision.  No hepatosplenomegaly. Vascular: No leg edema. Skin: Palms and soles without erythema.  No skin breakdown.   Lab Results:  Lab Results  Component Value Date   WBC 4.8 09/27/2023   HGB 10.7 (L) 09/27/2023   HCT 33.2 (L) 09/27/2023   MCV 86.5 09/27/2023   PLT 248 09/27/2023   NEUTROABS 2.9 09/27/2023    Imaging:  No results found.  Medications: I have reviewed the patient's current medications.  Assessment/Plan: Transverse colon cancer, stage IIIb (pT3,pN1b,cM0), status post a segmental transverse colectomy 08/03/2023 CT abdomen/pelvis 07/22/2023: Proximal colonic distention with transition point in the transverse colon, 4 cm lesion of the distal transverse colon with evidence of transmural extension/serosal component, small lymph nodes in the transverse mesocolon, indeterminate 1.9 cm left adrenal nodule Colonoscopy 07/22/2023: Mass in the ascending colon biopsy-invasive moderately differentiated adenocarcinoma CT chest 08/02/2023: No evidence of metastatic disease, indeterminate left adrenal nodule Segmental transverse colectomy and colocolonic anastomosis 08/03/2023-moderately differentiated adenocarcinoma of the transverse colon, tumor  extends into pericolonic adipose tissue, lymphovascular invasion present, 2/13 nodes Mismatch repair protein expression intact, MSI stable Elevated preoperative CEA 08/02/2023 (42.7) 08/31/2023 CEA normal (4.56) Cycle 1 capecitabine 09/08/2023 Cycle 2 capecitabine 09/29/2023 Indeterminate left adrenal nodule on CT abdomen/pelvis 07/22/2023 History of colonic polyps Hypertension For lipidemia Asthma History of H. Pylori Family history of colon cancer  Disposition: [name] appears stable.  She has completed 1 cycle of adjuvant capecitabine.  She tolerated well.  She will begin cycle 2 on 09/29/2023.  CBC reviewed.  Counts adequate to proceed as above.  She will return for lab and follow-up in 3 weeks.  She will contact the office in the interim with any problems.  We specifically discussed mouth sores, diarrhea, hand/foot pain or redness.    Lonna Cobb ANP/GNP-BC   09/27/2023  2:15 PM

## 2023-09-29 ENCOUNTER — Other Ambulatory Visit: Payer: Self-pay

## 2023-09-29 ENCOUNTER — Other Ambulatory Visit: Payer: Self-pay | Admitting: Oncology

## 2023-09-29 DIAGNOSIS — C184 Malignant neoplasm of transverse colon: Secondary | ICD-10-CM

## 2023-09-29 NOTE — Progress Notes (Signed)
 Specialty Pharmacy Refill Coordination Note  Adrienne Rogers is a 83 y.o. female contacted today regarding refills of specialty medication(s) Capecitabine (XELODA)   Patient requested (Patient-Rptd) Delivery   Delivery date: 10/14/23   Verified address: (Patient-Rptd) [address] [place]   Medication will be filled on 10/13/23.   This fill date is pending response to refill request from provider. Patient is aware and if they have not received fill by intended date they must follow up with pharmacy.

## 2023-10-03 NOTE — Telephone Encounter (Signed)
 Cycle start not due till 10/20/23. Will refill week prior to due date

## 2023-10-06 ENCOUNTER — Telehealth: Payer: Self-pay

## 2023-10-06 NOTE — Telephone Encounter (Signed)
 The patient reported experiencing four separate episodes of diarrhea and indicated that she has not taken any medication for this issue. I recommended that she begin taking Imodium to alleviate her symptoms. I advised the patient to contact us for a follow-up if there is no improvement with the medication. The patient agreed to start the anti-diarrheal medication.

## 2023-10-11 ENCOUNTER — Other Ambulatory Visit: Payer: Self-pay | Admitting: Gastroenterology

## 2023-10-11 DIAGNOSIS — R1310 Dysphagia, unspecified: Secondary | ICD-10-CM

## 2023-10-14 ENCOUNTER — Other Ambulatory Visit (HOSPITAL_COMMUNITY): Payer: Self-pay

## 2023-10-14 ENCOUNTER — Other Ambulatory Visit: Payer: Self-pay

## 2023-10-14 MED ORDER — CAPECITABINE 500 MG PO TABS
1500.0000 mg | ORAL_TABLET | Freq: Two times a day (BID) | ORAL | 0 refills | Status: DC
  Filled 2023-10-14: qty 84, 21d supply, fill #0

## 2023-10-18 ENCOUNTER — Ambulatory Visit
Admission: RE | Admit: 2023-10-18 | Discharge: 2023-10-18 | Disposition: A | Discharge status: Home / Self Care | Source: Ambulatory Visit | Attending: Gastroenterology | Admitting: Gastroenterology

## 2023-10-18 DIAGNOSIS — R1310 Dysphagia, unspecified: Secondary | ICD-10-CM

## 2023-10-19 ENCOUNTER — Inpatient Hospital Stay

## 2023-10-19 ENCOUNTER — Telehealth: Payer: Self-pay | Admitting: Oncology

## 2023-10-19 ENCOUNTER — Inpatient Hospital Stay: Attending: Oncology | Admitting: Oncology

## 2023-10-19 VITALS — BP 106/78 | HR 69 | Temp 98.1°F | Resp 18 | Ht 65.0 in | Wt 171.6 lb

## 2023-10-19 DIAGNOSIS — I10 Essential (primary) hypertension: Secondary | ICD-10-CM | POA: Insufficient documentation

## 2023-10-19 DIAGNOSIS — R42 Dizziness and giddiness: Secondary | ICD-10-CM

## 2023-10-19 DIAGNOSIS — Z9049 Acquired absence of other specified parts of digestive tract: Secondary | ICD-10-CM | POA: Diagnosis not present

## 2023-10-19 DIAGNOSIS — E279 Disorder of adrenal gland, unspecified: Secondary | ICD-10-CM | POA: Diagnosis not present

## 2023-10-19 DIAGNOSIS — Z860100 Personal history of colon polyps, unspecified: Secondary | ICD-10-CM | POA: Insufficient documentation

## 2023-10-19 DIAGNOSIS — C184 Malignant neoplasm of transverse colon: Secondary | ICD-10-CM

## 2023-10-19 DIAGNOSIS — Z800 Family history of malignant neoplasm of digestive organs: Secondary | ICD-10-CM | POA: Diagnosis not present

## 2023-10-19 LAB — CBC WITH DIFFERENTIAL (CANCER CENTER ONLY)
Abs Immature Granulocytes: 0.01 10*3/uL (ref 0.00–0.07)
Basophils Absolute: 0 10*3/uL (ref 0.0–0.1)
Basophils Relative: 1 %
Eosinophils Absolute: 0.1 10*3/uL (ref 0.0–0.5)
Eosinophils Relative: 2 %
HCT: 32.5 % — ABNORMAL LOW (ref 36.0–46.0)
Hemoglobin: 10.7 g/dL — ABNORMAL LOW (ref 12.0–15.0)
Immature Granulocytes: 0 %
Lymphocytes Relative: 40 %
Lymphs Abs: 1.7 10*3/uL (ref 0.7–4.0)
MCH: 28.6 pg (ref 26.0–34.0)
MCHC: 32.9 g/dL (ref 30.0–36.0)
MCV: 86.9 fL (ref 80.0–100.0)
Monocytes Absolute: 0.3 10*3/uL (ref 0.1–1.0)
Monocytes Relative: 6 %
Neutro Abs: 2.2 10*3/uL (ref 1.7–7.7)
Neutrophils Relative %: 51 %
Platelet Count: 175 10*3/uL (ref 150–400)
RBC: 3.74 MIL/uL — ABNORMAL LOW (ref 3.87–5.11)
RDW: 24.8 % — ABNORMAL HIGH (ref 11.5–15.5)
WBC Count: 4.3 10*3/uL (ref 4.0–10.5)
nRBC: 0 % (ref 0.0–0.2)

## 2023-10-19 LAB — CMP (CANCER CENTER ONLY)
ALT: 14 U/L (ref 0–44)
AST: 13 U/L — ABNORMAL LOW (ref 15–41)
Albumin: 4.4 g/dL (ref 3.5–5.0)
Alkaline Phosphatase: 77 U/L (ref 38–126)
Anion gap: 9 (ref 5–15)
BUN: 34 mg/dL — ABNORMAL HIGH (ref 8–23)
CO2: 25 mmol/L (ref 22–32)
Calcium: 9.4 mg/dL (ref 8.9–10.3)
Chloride: 107 mmol/L (ref 98–111)
Creatinine: 1.45 mg/dL — ABNORMAL HIGH (ref 0.44–1.00)
GFR, Estimated: 36 mL/min — ABNORMAL LOW (ref >60)
Glucose, Bld: 146 mg/dL — ABNORMAL HIGH (ref 70–99)
Potassium: 4.1 mmol/L (ref 3.5–5.1)
Sodium: 141 mmol/L (ref 135–145)
Total Bilirubin: 0.4 mg/dL (ref 0.0–1.2)
Total Protein: 6.6 g/dL (ref 6.5–8.1)

## 2023-10-19 LAB — CEA (ACCESS): CEA (CHCC): 5.26 ng/mL — ABNORMAL HIGH (ref 0.00–5.00)

## 2023-10-19 NOTE — Telephone Encounter (Signed)
 Patient has been scheduled for follow-up visit per 10/19/23 LOS.  Pt aware of scheduled appt details.

## 2023-10-19 NOTE — Progress Notes (Signed)
 [facility] OFFICE PROGRESS NOTE   Diagnosis: Colon cancer  INTERVAL HISTORY:   Adrienne Rogers returns as scheduled.  She begin another cycle of Xeloda on 09/29/2023.  No mouth sores, hand/foot pain, or diarrhea.  She reports a few episodes of nausea and vomiting over the past few months.  No consistent nausea.  She complains of "dizziness "and vertigo symptoms.  This occurs chiefly when standing.  No fall.  She is ambulating with a walker.  Objective:  Vital signs in last 24 hours:  Blood pressure 110/84, pulse 70, temperature 98.1 F (36.7 C), temperature source Temporal, resp. rate 18, height 5\' 5"  (1.651 m), weight 171 lb 9.6 oz (77.8 kg), SpO2 100%.    HEENT: No thrush or ulcers Resp: Lungs clear bilaterally Cardio: Regular rate and rhythm GI: No hepatosplenomegaly, nontender Vascular: No leg edema  Skin: Palms without erythema, dryness and mild hyperpigmentation of the soles  Lab Results:  Lab Results  Component Value Date   WBC 4.3 10/19/2023   HGB 10.7 (L) 10/19/2023   HCT 32.5 (L) 10/19/2023   MCV 86.9 10/19/2023   PLT 175 10/19/2023   NEUTROABS 2.2 10/19/2023    CMP  Lab Results  Component Value Date   NA 141 10/19/2023   K 4.1 10/19/2023   CL 107 10/19/2023   CO2 25 10/19/2023   GLUCOSE 146 (H) 10/19/2023   BUN 34 (H) 10/19/2023   CREATININE 1.45 (H) 10/19/2023   CALCIUM 9.4 10/19/2023   PROT 6.6 10/19/2023   ALBUMIN 4.4 10/19/2023   AST 13 (L) 10/19/2023   ALT 14 10/19/2023   ALKPHOS 77 10/19/2023   BILITOT 0.4 10/19/2023   GFRNONAA 36 (L) 10/19/2023   GFRAA 45 (L) 03/09/2018    Lab Results  Component Value Date   CEA1 42.7 (H) 08/02/2023   CEA 5.26 (H) 10/19/2023    Lab Results  Component Value Date   INR 1.03 03/09/2018   LABPROT 13.4 03/09/2018    Imaging:  DG ESOPHAGUS W DOUBLE CM (HD) Result Date: 10/18/2023 CLINICAL DATA:  Dysphagia.  Gastroesophageal reflux disease. EXAM: ESOPHAGUS/BARIUM SWALLOW/TABLET STUDY  TECHNIQUE: Combined double and single contrast examination was performed using effervescent crystals, high-density barium, and thin liquid barium. FLUOROSCOPY: Radiation Exposure Index (as provided by the fluoroscopic device): 7.8 mGy Kerma COMPARISON:  Esophagram 10/30/2015 FINDINGS: Swallowing: Appears normal. No vestibular penetration or aspiration seen. Pharynx: Unremarkable. Esophagus: Normal appearance. Esophageal motility: Mild esophageal dysmotility. No significant tertiary contractions. Hiatal Hernia: The previously shown small sliding hiatal hernia was not readily apparent during today's examination. Gastroesophageal reflux: None visualized. Ingested 13 mm barium tablet: Passed normally. IMPRESSION: Mild esophageal dysmotility, otherwise unremarkable esophagram. Electronically Signed   By: Aundra Lee M.D.   On: 10/18/2023 14:39    Medications: I have reviewed the patient's current medications.   Assessment/Plan: Transverse colon cancer, stage IIIb (pT3,pN1b,cM0), status post a segmental transverse colectomy 08/03/2023 CT abdomen/pelvis 07/22/2023: Proximal colonic distention with transition point in the transverse colon, 4 cm lesion of the distal transverse colon with evidence of transmural extension/serosal component, small lymph nodes in the transverse mesocolon, indeterminate 1.9 cm left adrenal nodule Colonoscopy 07/22/2023: Mass in the ascending colon biopsy-invasive moderately differentiated adenocarcinoma CT chest 08/02/2023: No evidence of metastatic disease, indeterminate left adrenal nodule Segmental transverse colectomy and colocolonic anastomosis 08/03/2023-moderately differentiated adenocarcinoma of the transverse colon, tumor extends into pericolonic adipose tissue, lymphovascular invasion present, 2/13 nodes Mismatch repair protein expression intact, MSI stable Elevated preoperative CEA 08/02/2023 (42.7) 08/31/2023 CEA  normal (4.56) Cycle 1 capecitabine 09/08/2023 Cycle 2  capecitabine 09/29/2023 Cycle 3 capecitabine 10/20/2023, capecitabine dose reduced to 1000 mg twice daily Indeterminate left adrenal nodule on CT abdomen/pelvis 07/22/2023 History of colonic polyps Hypertension For lipidemia Asthma History of H. Pylori Family history of colon cancer    Disposition: Adrienne Rogers has completed 2 cycles of capecitabine.  She appears to be tolerating the capecitabine well.  She complains of "dizziness "and vertigo symptoms.  Her daughter reports the symptoms were present to some degree prior to beginning capecitabine.  Adrienne Rogers requests a dose reduction of capecitabine.  The capecitabine will be dose reduced to 1000 mg twice daily.  I doubt her symptoms are related to capecitabine.  She will begin another cycle of capecitabine on 10/20/2023.  She will return for an office and lab visit in 3 weeks.  She will follow-up with her primary provider for evaluation of vertigo.  We will check orthostatic vital signs prior to her leaving the office today.  Coni Deep, MD  10/19/2023  11:38 AM

## 2023-10-26 ENCOUNTER — Other Ambulatory Visit: Payer: Self-pay | Admitting: Nurse Practitioner

## 2023-10-26 DIAGNOSIS — C184 Malignant neoplasm of transverse colon: Secondary | ICD-10-CM

## 2023-11-01 ENCOUNTER — Other Ambulatory Visit: Payer: Self-pay | Admitting: Pharmacy Technician

## 2023-11-01 ENCOUNTER — Other Ambulatory Visit: Payer: Self-pay | Admitting: Oncology

## 2023-11-01 ENCOUNTER — Other Ambulatory Visit: Payer: Self-pay

## 2023-11-01 ENCOUNTER — Other Ambulatory Visit (HOSPITAL_COMMUNITY): Payer: Self-pay

## 2023-11-01 DIAGNOSIS — C184 Malignant neoplasm of transverse colon: Secondary | ICD-10-CM

## 2023-11-01 MED ORDER — CAPECITABINE 500 MG PO TABS
1500.0000 mg | ORAL_TABLET | Freq: Two times a day (BID) | ORAL | 0 refills | Status: DC
  Filled 2023-11-01: qty 84, 21d supply, fill #0

## 2023-11-01 NOTE — Progress Notes (Signed)
 Specialty Pharmacy Refill Coordination Note  Adrienne Rogers is a 83 y.o. female contacted today regarding refills of specialty medication(s) Capecitabine  (XELODA )   Patient requested (Patient-Rptd) Delivery   Delivery date: 11/08/23   Verified address: [address] [place] [address]   Medication will be filled on 11/07/23.   This fill date is pending response to refill request from provider. Patient is aware and if they have not received fill by intended date they must follow up with pharmacy.

## 2023-11-06 ENCOUNTER — Emergency Department (HOSPITAL_COMMUNITY)

## 2023-11-06 ENCOUNTER — Inpatient Hospital Stay (HOSPITAL_COMMUNITY)
Admission: EM | Admit: 2023-11-06 | Discharge: 2023-11-10 | DRG: 175 | Disposition: A | Discharge status: Home / Self Care | Attending: Internal Medicine | Admitting: Internal Medicine

## 2023-11-06 ENCOUNTER — Encounter (HOSPITAL_COMMUNITY): Payer: Self-pay

## 2023-11-06 ENCOUNTER — Other Ambulatory Visit: Payer: Self-pay

## 2023-11-06 DIAGNOSIS — M1990 Unspecified osteoarthritis, unspecified site: Secondary | ICD-10-CM | POA: Insufficient documentation

## 2023-11-06 DIAGNOSIS — G589 Mononeuropathy, unspecified: Secondary | ICD-10-CM | POA: Insufficient documentation

## 2023-11-06 DIAGNOSIS — I2692 Saddle embolus of pulmonary artery without acute cor pulmonale: Secondary | ICD-10-CM | POA: Diagnosis present

## 2023-11-06 DIAGNOSIS — I10 Essential (primary) hypertension: Secondary | ICD-10-CM | POA: Diagnosis present

## 2023-11-06 DIAGNOSIS — Z6831 Body mass index (BMI) 31.0-31.9, adult: Secondary | ICD-10-CM

## 2023-11-06 DIAGNOSIS — J45909 Unspecified asthma, uncomplicated: Secondary | ICD-10-CM | POA: Insufficient documentation

## 2023-11-06 DIAGNOSIS — D696 Thrombocytopenia, unspecified: Secondary | ICD-10-CM | POA: Diagnosis present

## 2023-11-06 DIAGNOSIS — R630 Anorexia: Secondary | ICD-10-CM | POA: Diagnosis present

## 2023-11-06 DIAGNOSIS — Z7901 Long term (current) use of anticoagulants: Secondary | ICD-10-CM | POA: Diagnosis not present

## 2023-11-06 DIAGNOSIS — K55069 Acute infarction of intestine, part and extent unspecified: Secondary | ICD-10-CM | POA: Diagnosis not present

## 2023-11-06 DIAGNOSIS — Z96651 Presence of right artificial knee joint: Secondary | ICD-10-CM | POA: Diagnosis present

## 2023-11-06 DIAGNOSIS — G4730 Sleep apnea, unspecified: Secondary | ICD-10-CM | POA: Diagnosis present

## 2023-11-06 DIAGNOSIS — G4710 Hypersomnia, unspecified: Secondary | ICD-10-CM | POA: Diagnosis present

## 2023-11-06 DIAGNOSIS — F32A Depression, unspecified: Secondary | ICD-10-CM | POA: Diagnosis present

## 2023-11-06 DIAGNOSIS — I959 Hypotension, unspecified: Secondary | ICD-10-CM | POA: Diagnosis present

## 2023-11-06 DIAGNOSIS — I2694 Multiple subsegmental pulmonary emboli without acute cor pulmonale: Secondary | ICD-10-CM

## 2023-11-06 DIAGNOSIS — I82532 Chronic embolism and thrombosis of left popliteal vein: Secondary | ICD-10-CM | POA: Diagnosis present

## 2023-11-06 DIAGNOSIS — I129 Hypertensive chronic kidney disease with stage 1 through stage 4 chronic kidney disease, or unspecified chronic kidney disease: Secondary | ICD-10-CM | POA: Diagnosis present

## 2023-11-06 DIAGNOSIS — J309 Allergic rhinitis, unspecified: Secondary | ICD-10-CM | POA: Insufficient documentation

## 2023-11-06 DIAGNOSIS — J4489 Other specified chronic obstructive pulmonary disease: Secondary | ICD-10-CM | POA: Diagnosis present

## 2023-11-06 DIAGNOSIS — I82412 Acute embolism and thrombosis of left femoral vein: Secondary | ICD-10-CM | POA: Insufficient documentation

## 2023-11-06 DIAGNOSIS — Z79899 Other long term (current) drug therapy: Secondary | ICD-10-CM

## 2023-11-06 DIAGNOSIS — E66811 Obesity, class 1: Secondary | ICD-10-CM | POA: Diagnosis present

## 2023-11-06 DIAGNOSIS — Z87891 Personal history of nicotine dependence: Secondary | ICD-10-CM

## 2023-11-06 DIAGNOSIS — Z9049 Acquired absence of other specified parts of digestive tract: Secondary | ICD-10-CM

## 2023-11-06 DIAGNOSIS — Z86711 Personal history of pulmonary embolism: Secondary | ICD-10-CM | POA: Diagnosis not present

## 2023-11-06 DIAGNOSIS — Z882 Allergy status to sulfonamides status: Secondary | ICD-10-CM

## 2023-11-06 DIAGNOSIS — Z7982 Long term (current) use of aspirin: Secondary | ICD-10-CM | POA: Diagnosis not present

## 2023-11-06 DIAGNOSIS — C189 Malignant neoplasm of colon, unspecified: Secondary | ICD-10-CM | POA: Diagnosis present

## 2023-11-06 DIAGNOSIS — K55059 Acute (reversible) ischemia of intestine, part and extent unspecified: Secondary | ICD-10-CM | POA: Diagnosis present

## 2023-11-06 DIAGNOSIS — K279 Peptic ulcer, site unspecified, unspecified as acute or chronic, without hemorrhage or perforation: Secondary | ICD-10-CM | POA: Insufficient documentation

## 2023-11-06 DIAGNOSIS — E785 Hyperlipidemia, unspecified: Secondary | ICD-10-CM | POA: Diagnosis present

## 2023-11-06 DIAGNOSIS — R262 Difficulty in walking, not elsewhere classified: Secondary | ICD-10-CM | POA: Diagnosis present

## 2023-11-06 DIAGNOSIS — E782 Mixed hyperlipidemia: Secondary | ICD-10-CM | POA: Diagnosis not present

## 2023-11-06 DIAGNOSIS — D649 Anemia, unspecified: Secondary | ICD-10-CM | POA: Diagnosis not present

## 2023-11-06 DIAGNOSIS — D638 Anemia in other chronic diseases classified elsewhere: Secondary | ICD-10-CM | POA: Diagnosis present

## 2023-11-06 DIAGNOSIS — I2609 Other pulmonary embolism with acute cor pulmonale: Secondary | ICD-10-CM | POA: Diagnosis not present

## 2023-11-06 DIAGNOSIS — F339 Major depressive disorder, recurrent, unspecified: Secondary | ICD-10-CM | POA: Insufficient documentation

## 2023-11-06 DIAGNOSIS — K219 Gastro-esophageal reflux disease without esophagitis: Secondary | ICD-10-CM | POA: Diagnosis present

## 2023-11-06 DIAGNOSIS — Z881 Allergy status to other antibiotic agents status: Secondary | ICD-10-CM

## 2023-11-06 DIAGNOSIS — N1832 Chronic kidney disease, stage 3b: Secondary | ICD-10-CM | POA: Diagnosis present

## 2023-11-06 DIAGNOSIS — I2699 Other pulmonary embolism without acute cor pulmonale: Secondary | ICD-10-CM | POA: Diagnosis present

## 2023-11-06 DIAGNOSIS — M9390 Osteochondropathy, unspecified of unspecified site: Secondary | ICD-10-CM | POA: Insufficient documentation

## 2023-11-06 DIAGNOSIS — I81 Portal vein thrombosis: Secondary | ICD-10-CM | POA: Diagnosis present

## 2023-11-06 DIAGNOSIS — F4312 Post-traumatic stress disorder, chronic: Secondary | ICD-10-CM | POA: Insufficient documentation

## 2023-11-06 DIAGNOSIS — Z90710 Acquired absence of both cervix and uterus: Secondary | ICD-10-CM

## 2023-11-06 DIAGNOSIS — Z7951 Long term (current) use of inhaled steroids: Secondary | ICD-10-CM

## 2023-11-06 DIAGNOSIS — E669 Obesity, unspecified: Secondary | ICD-10-CM | POA: Diagnosis not present

## 2023-11-06 DIAGNOSIS — R0602 Shortness of breath: Secondary | ICD-10-CM | POA: Diagnosis present

## 2023-11-06 DIAGNOSIS — D509 Iron deficiency anemia, unspecified: Secondary | ICD-10-CM | POA: Insufficient documentation

## 2023-11-06 DIAGNOSIS — F4310 Post-traumatic stress disorder, unspecified: Secondary | ICD-10-CM | POA: Diagnosis present

## 2023-11-06 DIAGNOSIS — N63 Unspecified lump in unspecified breast: Secondary | ICD-10-CM | POA: Insufficient documentation

## 2023-11-06 DIAGNOSIS — Z66 Do not resuscitate: Secondary | ICD-10-CM | POA: Diagnosis present

## 2023-11-06 DIAGNOSIS — C184 Malignant neoplasm of transverse colon: Secondary | ICD-10-CM | POA: Diagnosis not present

## 2023-11-06 DIAGNOSIS — M5412 Radiculopathy, cervical region: Secondary | ICD-10-CM | POA: Insufficient documentation

## 2023-11-06 DIAGNOSIS — I82512 Chronic embolism and thrombosis of left femoral vein: Secondary | ICD-10-CM | POA: Diagnosis not present

## 2023-11-06 DIAGNOSIS — H40013 Open angle with borderline findings, low risk, bilateral: Secondary | ICD-10-CM | POA: Insufficient documentation

## 2023-11-06 DIAGNOSIS — F411 Generalized anxiety disorder: Secondary | ICD-10-CM | POA: Insufficient documentation

## 2023-11-06 DIAGNOSIS — J449 Chronic obstructive pulmonary disease, unspecified: Secondary | ICD-10-CM | POA: Diagnosis present

## 2023-11-06 DIAGNOSIS — Z885 Allergy status to narcotic agent status: Secondary | ICD-10-CM

## 2023-11-06 DIAGNOSIS — Z888 Allergy status to other drugs, medicaments and biological substances status: Secondary | ICD-10-CM

## 2023-11-06 LAB — BASIC METABOLIC PANEL WITH GFR
Anion gap: 8 (ref 5–15)
BUN: 32 mg/dL — ABNORMAL HIGH (ref 8–23)
CO2: 24 mmol/L (ref 22–32)
Calcium: 9.6 mg/dL (ref 8.9–10.3)
Chloride: 108 mmol/L (ref 98–111)
Creatinine, Ser: 1.56 mg/dL — ABNORMAL HIGH (ref 0.44–1.00)
GFR, Estimated: 33 mL/min — ABNORMAL LOW (ref >60)
Glucose, Bld: 126 mg/dL — ABNORMAL HIGH (ref 70–99)
Potassium: 4.2 mmol/L (ref 3.5–5.1)
Sodium: 140 mmol/L (ref 135–145)

## 2023-11-06 LAB — CBC
HCT: 33.1 % — ABNORMAL LOW (ref 36.0–46.0)
Hemoglobin: 11 g/dL — ABNORMAL LOW (ref 12.0–15.0)
MCH: 30.5 pg (ref 26.0–34.0)
MCHC: 33.2 g/dL (ref 30.0–36.0)
MCV: 91.7 fL (ref 80.0–100.0)
Platelets: 133 10*3/uL — ABNORMAL LOW (ref 150–400)
RBC: 3.61 MIL/uL — ABNORMAL LOW (ref 3.87–5.11)
RDW: 27.3 % — ABNORMAL HIGH (ref 11.5–15.5)
WBC: 5.6 10*3/uL (ref 4.0–10.5)
nRBC: 0.4 % — ABNORMAL HIGH (ref 0.0–0.2)

## 2023-11-06 LAB — TROPONIN I (HIGH SENSITIVITY)
Troponin I (High Sensitivity): 8 ng/L (ref <18)
Troponin I (High Sensitivity): 19 ng/L — ABNORMAL HIGH (ref <18)

## 2023-11-06 LAB — RESP PANEL BY RT-PCR (RSV, FLU A&B, COVID)  RVPGX2
Influenza A by PCR: NEGATIVE
Influenza B by PCR: NEGATIVE
Resp Syncytial Virus by PCR: NEGATIVE
SARS Coronavirus 2 by RT PCR: NEGATIVE

## 2023-11-06 MED ORDER — SERTRALINE HCL 100 MG PO TABS
200.0000 mg | ORAL_TABLET | Freq: Every day | ORAL | Status: DC
  Administered 2023-11-07 – 2023-11-10 (×4): 200 mg via ORAL
  Filled 2023-11-06 (×4): qty 2

## 2023-11-06 MED ORDER — ONDANSETRON HCL 4 MG/2ML IJ SOLN: 4.0000 mg | Freq: Four times a day (QID) | INTRAMUSCULAR | Status: DC | PRN

## 2023-11-06 MED ORDER — SODIUM CHLORIDE 0.9% FLUSH: 3.0000 mL | INTRAVENOUS | Status: DC | PRN

## 2023-11-06 MED ORDER — METOPROLOL TARTRATE 5 MG/5ML IV SOLN: 5.0000 mg | Freq: Four times a day (QID) | INTRAVENOUS | Status: DC | PRN

## 2023-11-06 MED ORDER — PANTOPRAZOLE SODIUM 40 MG PO TBEC
40.0000 mg | DELAYED_RELEASE_TABLET | Freq: Two times a day (BID) | ORAL | Status: DC
  Administered 2023-11-06 – 2023-11-10 (×8): 40 mg via ORAL
  Filled 2023-11-06 (×8): qty 1

## 2023-11-06 MED ORDER — TIOTROPIUM BROMIDE MONOHYDRATE 2.5 MCG/ACT IN AERS: 2.0000 | INHALATION_SPRAY | Freq: Every day | RESPIRATORY_TRACT | Status: DC

## 2023-11-06 MED ORDER — FERROUS SULFATE 325 (65 FE) MG PO TABS
325.0000 mg | ORAL_TABLET | Freq: Every day | ORAL | Status: DC
  Administered 2023-11-07 – 2023-11-10 (×4): 325 mg via ORAL
  Filled 2023-11-06 (×4): qty 1

## 2023-11-06 MED ORDER — ALBUTEROL SULFATE (2.5 MG/3ML) 0.083% IN NEBU
5.0000 mg | INHALATION_SOLUTION | Freq: Once | RESPIRATORY_TRACT | Status: AC
  Administered 2023-11-06: 5 mg via RESPIRATORY_TRACT
  Filled 2023-11-06: qty 6

## 2023-11-06 MED ORDER — TRAZODONE HCL 100 MG PO TABS
100.0000 mg | ORAL_TABLET | Freq: Every day | ORAL | Status: DC
Start: 2023-11-06 — End: 2023-11-10
  Administered 2023-11-06 – 2023-11-09 (×4): 100 mg via ORAL
  Filled 2023-11-06 (×4): qty 1

## 2023-11-06 MED ORDER — HEPARIN (PORCINE) 25000 UT/250ML-% IV SOLN
750.0000 [IU]/h | INTRAVENOUS | Status: AC
  Administered 2023-11-06: 1000 [IU]/h via INTRAVENOUS
  Administered 2023-11-07 – 2023-11-08 (×2): 900 [IU]/h via INTRAVENOUS
  Filled 2023-11-06 (×5): qty 250

## 2023-11-06 MED ORDER — SIMVASTATIN 40 MG PO TABS
40.0000 mg | ORAL_TABLET | Freq: Every day | ORAL | Status: DC
  Administered 2023-11-06 – 2023-11-09 (×4): 40 mg via ORAL
  Filled 2023-11-06 (×4): qty 1

## 2023-11-06 MED ORDER — ONDANSETRON HCL 4 MG PO TABS: 4.0000 mg | ORAL_TABLET | Freq: Four times a day (QID) | ORAL | Status: DC | PRN

## 2023-11-06 MED ORDER — HEPARIN BOLUS VIA INFUSION
2000.0000 [IU] | Freq: Once | INTRAVENOUS | Status: AC
  Administered 2023-11-06: 2000 [IU] via INTRAVENOUS
  Filled 2023-11-06: qty 2000

## 2023-11-06 MED ORDER — SODIUM CHLORIDE 0.9 % IV BOLUS
1000.0000 mL | Freq: Once | INTRAVENOUS | Status: AC
  Administered 2023-11-06: 1000 mL via INTRAVENOUS

## 2023-11-06 MED ORDER — DOCUSATE SODIUM 100 MG PO CAPS
100.0000 mg | ORAL_CAPSULE | Freq: Two times a day (BID) | ORAL | Status: DC | PRN
  Administered 2023-11-07 – 2023-11-08 (×2): 100 mg via ORAL
  Filled 2023-11-06 (×2): qty 1

## 2023-11-06 MED ORDER — FLUTICASONE FUROATE-VILANTEROL 200-25 MCG/ACT IN AEPB
1.0000 | INHALATION_SPRAY | Freq: Every day | RESPIRATORY_TRACT | Status: DC
  Administered 2023-11-07 – 2023-11-10 (×4): 1 via RESPIRATORY_TRACT
  Filled 2023-11-06: qty 28

## 2023-11-06 MED ORDER — CARVEDILOL 12.5 MG PO TABS
12.5000 mg | ORAL_TABLET | Freq: Two times a day (BID) | ORAL | Status: DC
Start: 2023-11-06 — End: 2023-11-10
  Administered 2023-11-06 – 2023-11-10 (×8): 12.5 mg via ORAL
  Filled 2023-11-06 (×8): qty 1

## 2023-11-06 MED ORDER — SODIUM CHLORIDE 0.9 % IV SOLN: 250.0000 mL | INTRAVENOUS | Status: AC | PRN

## 2023-11-06 MED ORDER — VITAMIN D 25 MCG (1000 UNIT) PO TABS
1000.0000 [IU] | ORAL_TABLET | Freq: Every day | ORAL | Status: DC
  Administered 2023-11-07 – 2023-11-10 (×4): 1000 [IU] via ORAL
  Filled 2023-11-06 (×4): qty 1

## 2023-11-06 MED ORDER — FENTANYL CITRATE PF 50 MCG/ML IJ SOSY
12.5000 ug | PREFILLED_SYRINGE | INTRAMUSCULAR | Status: DC | PRN
Start: 2023-11-06 — End: 2023-11-10

## 2023-11-06 MED ORDER — SPIRONOLACTONE 25 MG PO TABS
25.0000 mg | ORAL_TABLET | Freq: Every day | ORAL | Status: DC
  Administered 2023-11-07 – 2023-11-10 (×4): 25 mg via ORAL
  Filled 2023-11-06 (×4): qty 1

## 2023-11-06 MED ORDER — ACETAMINOPHEN 650 MG RE SUPP: 650.0000 mg | Freq: Four times a day (QID) | RECTAL | Status: DC | PRN

## 2023-11-06 MED ORDER — UMECLIDINIUM BROMIDE 62.5 MCG/ACT IN AEPB
1.0000 | INHALATION_SPRAY | Freq: Every day | RESPIRATORY_TRACT | Status: DC
  Administered 2023-11-07 – 2023-11-10 (×4): 1 via RESPIRATORY_TRACT
  Filled 2023-11-06: qty 7

## 2023-11-06 MED ORDER — POLYETHYLENE GLYCOL 3350 17 G PO PACK
17.0000 g | PACK | Freq: Two times a day (BID) | ORAL | Status: DC | PRN
  Administered 2023-11-07 – 2023-11-08 (×2): 17 g via ORAL
  Filled 2023-11-06 (×2): qty 1

## 2023-11-06 MED ORDER — IOHEXOL 350 MG/ML SOLN
80.0000 mL | Freq: Once | INTRAVENOUS | Status: AC | PRN
  Administered 2023-11-06: 80 mL via INTRAVENOUS

## 2023-11-06 MED ORDER — SODIUM CHLORIDE 0.9% FLUSH
3.0000 mL | Freq: Two times a day (BID) | INTRAVENOUS | Status: DC
  Administered 2023-11-06 – 2023-11-10 (×7): 3 mL via INTRAVENOUS

## 2023-11-06 MED ORDER — ACETAMINOPHEN 325 MG PO TABS
650.0000 mg | ORAL_TABLET | Freq: Four times a day (QID) | ORAL | Status: DC | PRN
  Administered 2023-11-06: 650 mg via ORAL
  Filled 2023-11-06: qty 2

## 2023-11-06 MED ORDER — ALBUTEROL SULFATE (2.5 MG/3ML) 0.083% IN NEBU: 2.5000 mg | INHALATION_SOLUTION | Freq: Four times a day (QID) | RESPIRATORY_TRACT | Status: DC | PRN

## 2023-11-06 MED ORDER — KETOTIFEN FUMARATE 0.035 % OP SOLN
1.0000 [drp] | Freq: Two times a day (BID) | OPHTHALMIC | Status: DC
  Administered 2023-11-06 – 2023-11-08 (×2): 1 [drp] via OPHTHALMIC
  Filled 2023-11-06: qty 5

## 2023-11-06 MED ORDER — IPRATROPIUM BROMIDE 0.02 % IN SOLN
0.5000 mg | Freq: Once | RESPIRATORY_TRACT | Status: AC
  Administered 2023-11-06: 0.5 mg via RESPIRATORY_TRACT
  Filled 2023-11-06: qty 2.5

## 2023-11-06 NOTE — Assessment & Plan Note (Signed)
 Creatinine is 1.56 today.  This is higher than her baseline creatinine is 0.96 Hold nephrotoxic agents Trend

## 2023-11-06 NOTE — Assessment & Plan Note (Signed)
 Continue Coreg  Continue spironolactone  BP was mildly elevated in the ED today she had previously been on prazosin and an ARB both of which caused such dramatic hypotension that she has had to stop these.

## 2023-11-06 NOTE — Progress Notes (Signed)
 PHARMACY - ANTICOAGULATION CONSULT NOTE  Pharmacy Consult for IV UFH Indication: pulmonary embolus  Allergies  Allergen Reactions   Lisinopril Tinitus   Ezetimibe-Simvastatin  Other (See Comments)    Muscle Pain   Fosamax  [Alendronate Sodium] Other (See Comments)    Heartburn   Sulfa Antibiotics Itching, Rash, Other (See Comments) and Hives    Other Reaction(s): ITCHING,WATERING EYES, HIVES   Bupropion  Other (See Comments)    vertigo   Doxepin Swelling   Prazosin     Other reaction(s): Low blood pressure   Tetracyclines & Related Other (See Comments) and Nausea And Vomiting    Swelling eruption of the lips    Patient Measurements: Height: 5\' 2"  (157.5 cm) Weight: 77 kg (169 lb 12.1 oz) IBW/kg (Calculated) : 50.1 HEPARIN DW (KG): 66.9  Vital Signs: Temp: 97.5 F (36.4 C) (05/04 1749) Temp Source: Oral (05/04 1749) BP: 127/80 (05/04 1722) Pulse Rate: 62 (05/04 1722)  Labs: Recent Labs    11/06/23 1404  HGB 11.0*  HCT 33.1*  PLT 133*  CREATININE 1.56*  TROPONINIHS 8    Estimated Creatinine Clearance: 27.2 mL/min (A) (by C-G formula based on SCr of 1.56 mg/dL (H)).   Medical History: Past Medical History:  Diagnosis Date   Asthma    COPD (chronic obstructive pulmonary disease) (HCC)    Depression    Hyperlipidemia    Hypertension    PTSD (post-traumatic stress disorder)     Medications:  Scheduled:  Infusions:   Assessment: Patient presented to ER with shortness of breath now to start IV heparin for new PE. Baseline CBC: Hgb 11, Plt 133. SCr 1.56 CrCl 27. Not on anticoagulation prior to admission  Goal of Therapy:  Heparin level 0.3-0.7 units/ml Monitor platelets by anticoagulation protocol: Yes   Plan:  IV heparin 2000 unit bolus then IV heparin rate of 1000 units/hr Check heparin level in 8 hours Daily CBC and heparin level    Bernett Brill 11/06/2023,6:02 PM

## 2023-11-06 NOTE — Assessment & Plan Note (Signed)
 Continue PPI ?

## 2023-11-06 NOTE — ED Provider Notes (Signed)
  EMERGENCY DEPARTMENT AT [facility] Provider Note   CSN: [id number] Arrival date & time: 11/06/23  1323     History  Chief Complaint  Patient presents with   Shortness of Breath   Dizziness    Adrienne Rogers is a 83 y.o. female history of colon cancer with no known mets to the chest, here presenting with shortness of breath and dizziness.  Patient has been having symptoms for the last several weeks.  Patient just finished chemo about 2 weeks ago.  She states that she thought it was a chemo side effect but her symptoms got worse.  She states that she has shortness of breath with minimal exertion.  She also was concerned that she may be wheezing.  She states that she has a history of asthma but does not have any albuterol  currently.  The history is provided by the patient.       Home Medications Prior to Admission medications   Medication Sig Start Date End Date Taking? Authorizing Provider  acetaminophen  (TYLENOL ) 500 MG tablet Take 2 tablets (1,000 mg total) by mouth every 8 (eight) hours as needed for mild pain (pain score 1-3). Patient not taking: Reported on 10/19/2023 08/09/23   Maczis, Michael M, PA-C  albuterol  (VENTOLIN  HFA) 108 (90 Base) MCG/ACT inhaler Inhale 2 puffs into the lungs every 6 (six) hours as needed for wheezing or shortness of breath. 08/09/23   Maczis, Michael M, PA-C  aspirin  EC 81 MG tablet Take 81 mg by mouth daily. Swallow whole. Patient not taking: Reported on 10/19/2023    [provider]  capecitabine  (XELODA ) 500 MG tablet Take 3 tablets (1,500 mg total) by mouth 2 (two) times daily after a meal. Take for 14 days, then hold for 7 days. Repeat every 21 days. Start cycle on 11/10/2023 11/01/23   Sumner Ends, MD  carvedilol  (COREG ) 25 MG tablet Take 0.5 tablets (12.5 mg total) by mouth 2 (two) times daily. 11/10/21   Oral Billings, MD  Cholecalciferol 25 MCG (1000 UT) tablet Take 1,000 Units by mouth daily. 01/10/21   [provider]  docusate sodium  (COLACE) 100 MG capsule Take 1 capsule (100 mg total) by mouth 2 (two) times daily as needed for mild constipation. Patient not taking: Reported on 10/19/2023 08/09/23   Maczis, Michael M, PA-C  losartan  (COZAAR ) 100 MG tablet Take 50 mg by mouth at bedtime as needed (blood pressure). Patient not taking: Reported on 08/31/2023 11/04/21   [provider]  methocarbamol  (ROBAXIN ) 500 MG tablet Take 1 tablet (500 mg total) by mouth every 6 (six) hours as needed for muscle spasms. Patient not taking: Reported on 08/31/2023 08/09/23   Maczis, Michael M, PA-C  polyethylene glycol (MIRALAX  / GLYCOLAX ) 17 g packet Take 17 g by mouth 2 (two) times daily as needed. Patient not taking: Reported on 08/31/2023 08/09/23   Maczis, Michael M, PA-C  prazosin (MINIPRESS) 1 MG capsule Take 1 mg by mouth at bedtime. Patient not taking: Reported on 10/19/2023 09/06/23   [provider]  sertraline  (ZOLOFT ) 100 MG tablet Take 200 mg by mouth daily. 10/02/21   [provider]  simvastatin  (ZOCOR ) 40 MG tablet Take 40 mg by mouth at bedtime.     [provider]  spironolactone  (ALDACTONE ) 25 MG tablet Take 25 mg by mouth daily. 09/20/21   [provider]  Tiotropium Bromide Monohydrate 2.5 MCG/ACT AERS Take 2 puffs by mouth daily. 08/13/22   [provider]  traMADol  (ULTRAM ) 50 MG tablet Take 1 tablet (50 mg total) by mouth every 6 (six) hours as needed. Patient not taking: Reported on 08/31/2023 08/09/23   Maczis, Michael M, PA-C  traZODone  (DESYREL ) 100 MG tablet Take 100 mg by mouth at bedtime.    [provider]  Zachery Hermes INHUB 250-50 MCG/ACT AEPB Inhale 1 puff into the lungs 2 (two) times daily. 09/19/21   [provider]      Allergies    Lisinopril, Ezetimibe-simvastatin , Fosamax  [alendronate sodium], Sulfa antibiotics, Bupropion , Doxepin, Prazosin, and Tetracyclines & related    Review of Systems   Review of Systems   Respiratory:  Positive for shortness of breath.   Neurological:  Positive for dizziness.  All other systems reviewed and are negative.   Physical Exam Updated Vital Signs BP 117/74 (BP Location: Left Arm)   Pulse 66   Temp 97.6 F (36.4 C) (Oral)   Resp 18   Ht 5\' 2"  (1.575 m)   Wt 77 kg   SpO2 100%   BMI 31.05 kg/m  Physical Exam Vitals and nursing note reviewed.  Constitutional:      Comments: Chronically ill  HENT:     Head: Normocephalic.     Mouth/Throat:     Mouth: Mucous membranes are moist.  Eyes:     Extraocular Movements: Extraocular movements intact.     Pupils: Pupils are equal, round, and reactive to light.  Cardiovascular:     Rate and Rhythm: Normal rate and regular rhythm.  Pulmonary:     Comments: Diminished bilaterally and no obvious wheezing.  No crackles Abdominal:     Palpations: Abdomen is soft.     Comments: Patient has a lower abdominal surgical scar with mild tenderness.  Musculoskeletal:        General: Normal range of motion.     Cervical back: Normal range of motion and neck supple.  Skin:    General: Skin is warm.     Capillary Refill: Capillary refill takes less than 2 seconds.  Neurological:     General: No focal deficit present.     Mental Status: She is alert and oriented to person, place, and time.  Psychiatric:        Mood and Affect: Mood normal.        Behavior: Behavior normal.     ED Results / Procedures / Treatments   Labs (all labs ordered are listed, but only abnormal results are displayed) Labs Reviewed  BASIC METABOLIC PANEL WITH GFR - Abnormal; Notable for the following components:      Result Value   Glucose, Bld 126 (*)    BUN 32 (*)    Creatinine, Ser 1.56 (*)    GFR, Estimated 33 (*)    All other components within normal limits  CBC - Abnormal; Notable for the following components:   RBC 3.61 (*)    Hemoglobin 11.0 (*)    HCT 33.1 (*)    RDW 27.3 (*)    Platelets 133 (*)    nRBC 0.4 (*)    All other  components within normal limits  RESP PANEL BY RT-PCR (RSV, FLU A&B, COVID)  [id number]  TROPONIN I (HIGH SENSITIVITY)    EKG EKG Interpretation Date/Time:  Sunday Nov 06 2023 13:32:40 EDT Ventricular Rate:  65 PR Interval:  174 QRS Duration:  89 QT Interval:  408 QTC Calculation: 425 R Axis:   44  Text Interpretation: Sinus rhythm Borderline repolarization abnormality  No significant change since last tracing Confirmed by Florette Hurry ([id number]) on 11/06/2023 2:57:12 PM  Radiology DG Chest 2 View Result Date: 11/06/2023 CLINICAL DATA:  Chest pain and shortness of breath.  Colon cancer. EXAM: CHEST - 2 VIEW COMPARISON:  Two-view chest x-ray 07/24/2017.  CT chest 08/02/2023 FINDINGS: Heart size is normal. Lungs are clear. No edema or effusion is present. No focal airspace disease present. Mild degenerative changes are noted in the thoracic spine. Cervical spine surgery noted. IMPRESSION: No acute cardiopulmonary disease. Electronically Signed   By: Audree Leas M.D.   On: 11/06/2023 14:43    Procedures Procedures    CRITICAL CARE Performed by: Florette Hurry   Total critical care time: 39 minutes  Critical care time was exclusive of separately billable procedures and treating other patients.  Critical care was necessary to treat or prevent imminent or life-threatening deterioration.  Critical care was time spent personally by me on the following activities: development of treatment plan with patient and/or surrogate as well as nursing, discussions with consultants, evaluation of patient's response to treatment, examination of patient, obtaining history from patient or surrogate, ordering and performing treatments and interventions, ordering and review of laboratory studies, ordering and review of radiographic studies, pulse oximetry and re-evaluation of patient's condition.   Medications Ordered in ED Medications  albuterol  (PROVENTIL ) (2.5 MG/3ML) 0.083% nebulizer solution 5 mg (5  mg Nebulization Given 11/06/23 1515)  ipratropium (ATROVENT) nebulizer solution 0.5 mg (0.5 mg Nebulization Given 11/06/23 1513)    ED Course/ Medical Decision Making/ A&P                                 Medical Decision Making SAMRAWIT VANDENHEUVEL is a 83 y.o. female who presented with shortness of breath and abdominal pain.  Patient has worsening shortness of breath especially with exertion.  Consider bronchitis versus PE versus ACS.  Plan to get CBC and BMP and troponin x 2 and CTA chest.  Patient also has abdominal pain has previous abdominal surgery for colon cancer.  Will get CT abdomen pelvis to rule out bowel obstruction.  6:18 PM Reviewed patient's labs and creatinine is baseline at 1.5.  Troponin is negative.  CTA showed saddle embolus and also nonocclusive clot in the SMV.  Patient is started on heparin.  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 Decision-making details documented in ED Course. Radiology: ordered and independent interpretation performed. Decision-making details documented in ED Course.  Risk Prescription drug management.     Final Clinical Impression(s) / ED Diagnoses Final diagnoses:  None    Rx / DC Orders ED Discharge Orders     None         Dalene Duck, MD 11/06/23 1819

## 2023-11-06 NOTE — H&P (Signed)
 History and Physical    Patient: Adrienne Rogers WJX:[id number] DOB: [birth year] DOA: 11/06/2023 DOS: the patient was seen and examined on 11/06/2023 PCP: Dondra Fuel, MD  Patient coming from: Home  Chief Complaint:  Chief Complaint  Patient presents with   Shortness of Breath   Dizziness   HPI: Adrienne Rogers is a 83 y.o.HTN, HLD, GERD and colon cancer who was on oral chemotherapeutics who presents with a 2-week history of weakness, shortness of breath, dizziness.  Patient has been on oral chemo and thought her symptoms might be only related to that but her shortness of breath became worse with just minimal exertion and she finally decided to come in and have it checked out. Patient has a history of COPD from remote smoking history and is on multiple inhalers at home. In the ED patient had a negative respiratory panel negative troponins and a CT that showed a saddle embolism.  CT of the abdomen also showed nonocclusive clot in the superior mesenteric vein. The patient was started on heparin drip and we were asked to admit. female with medical history significant of   Review of Systems: As mentioned in the history of present illness. All other systems reviewed and are negative. Past Medical History:  Diagnosis Date   Asthma    COPD (chronic obstructive pulmonary disease) (HCC)    Depression    Hyperlipidemia    Hypertension    PTSD (post-traumatic stress disorder)    Past Surgical History:  Procedure Laterality Date   ABDOMINAL HYSTERECTOMY     1981   BACK SURGERY     ACDF 2005   BIOPSY  11/10/2021   Procedure: BIOPSY;  Surgeon: Alvis Jourdain, MD;  Location: [facility];  Service: Gastroenterology;;   CHOLECYSTECTOMY     ESOPHAGOGASTRODUODENOSCOPY N/A 11/10/2021   Procedure: ESOPHAGOGASTRODUODENOSCOPY (EGD);  Surgeon: Alvis Jourdain, MD;  Location: [facility];  Service: Gastroenterology;  Laterality: N/A;  IDA, Melena   PARTIAL COLECTOMY N/A 08/03/2023   Procedure: OPEN  PARTIAL COLECTOMY;  Surgeon: Adalberto Acton, MD;  Location: [facility];  Service: General;  Laterality: N/A;   right knee arthroscopy     TOTAL KNEE ARTHROPLASTY Right 03/14/2018   TOTAL KNEE ARTHROPLASTY Right 03/14/2018   Procedure: TOTAL KNEE ARTHROPLASTY;  Surgeon: Dayne Even, MD;  Location: [facility];  Service: Orthopedics;  Laterality: Right;   Social History:  reports that she has never smoked. She has never used smokeless tobacco. She reports that she does not currently use alcohol. She reports that she does not currently use drugs.  Allergies  Allergen Reactions   Codeine Itching, Rash and Other (See Comments)   Lisinopril Tinitus   Ezetimibe-Simvastatin  Other (See Comments)    Muscle Pain   Fosamax  [Alendronate Sodium] Other (See Comments)    Heartburn   Sulfa Antibiotics Hives, Itching, Other (See Comments) and Rash    Other Reaction(s): ITCHING,WATERING EYES, HIVES   Bupropion  Other (See Comments)    vertigo   Doxepin Swelling   Prazosin     Other reaction(s): Low blood pressure   Tetracyclines & Related Other (See Comments) and Nausea And Vomiting    Swelling eruption of the lips    History reviewed. No pertinent family history.  Prior to Admission medications   Medication Sig Start Date End Date Taking? Authorizing Provider  fluticasone -salmeterol (ADVAIR) 250-50 MCG/ACT AEPB Inhale 1 puff into the lungs in the morning and at bedtime. 09/06/23  Yes [provider]  acetaminophen  (TYLENOL ) 500 MG  tablet Take 2 tablets (1,000 mg total) by mouth every 8 (eight) hours as needed for mild pain (pain score 1-3). Patient not taking: Reported on 10/19/2023 08/09/23   Maczis, Michael M, PA-C  albuterol  (VENTOLIN  HFA) 108 (90 Base) MCG/ACT inhaler Inhale 2 puffs into the lungs every 6 (six) hours as needed for wheezing or shortness of breath. 08/09/23   Maczis, Michael M, PA-C  Albuterol  Sulfate (PROAIR  RESPICLICK) 108 (90 Base) MCG/ACT AEPB Inhale 2 puffs into the lungs every  6 (six) hours as needed (wheezing).    [provider]  aspirin  EC 81 MG tablet Take 81 mg by mouth daily. Swallow whole. Patient not taking: Reported on 10/19/2023    [provider]  capecitabine  (XELODA ) 500 MG tablet Take 3 tablets (1,500 mg total) by mouth 2 (two) times daily after a meal. Take for 14 days, then hold for 7 days. Repeat every 21 days. Start cycle on 11/10/2023 11/01/23   Sumner Ends, MD  carvedilol  (COREG ) 25 MG tablet Take 0.5 tablets (12.5 mg total) by mouth 2 (two) times daily. 11/10/21   Oral Billings, MD  Cholecalciferol 25 MCG (1000 UT) tablet Take 1,000 Units by mouth daily. 01/10/21   [provider]  docusate sodium  (COLACE) 100 MG capsule Take 1 capsule (100 mg total) by mouth 2 (two) times daily as needed for mild constipation. Patient not taking: Reported on 10/19/2023 08/09/23   Maczis, Michael M, PA-C  losartan  (COZAAR ) 100 MG tablet Take 50 mg by mouth at bedtime as needed (blood pressure). Patient not taking: Reported on 08/31/2023 11/04/21   [provider]  methocarbamol  (ROBAXIN ) 500 MG tablet Take 1 tablet (500 mg total) by mouth every 6 (six) hours as needed for muscle spasms. Patient not taking: Reported on 08/31/2023 08/09/23   Maczis, Michael M, PA-C  polyethylene glycol (MIRALAX  / GLYCOLAX ) 17 g packet Take 17 g by mouth 2 (two) times daily as needed. Patient not taking: Reported on 08/31/2023 08/09/23   Maczis, Michael M, PA-C  prazosin (MINIPRESS) 1 MG capsule Take 1 mg by mouth at bedtime. Patient not taking: Reported on 10/19/2023 09/06/23   [provider]  sertraline  (ZOLOFT ) 100 MG tablet Take 200 mg by mouth daily. 10/02/21   [provider]  simvastatin  (ZOCOR ) 40 MG tablet Take 40 mg by mouth at bedtime.     [provider]  spironolactone  (ALDACTONE ) 25 MG tablet Take 25 mg by mouth daily. 09/20/21   [provider]  Tiotropium Bromide Monohydrate 2.5 MCG/ACT AERS Take 2 puffs by mouth  daily. 08/13/22   [provider]  traMADol  (ULTRAM ) 50 MG tablet Take 1 tablet (50 mg total) by mouth every 6 (six) hours as needed. Patient not taking: Reported on 08/31/2023 08/09/23   Maczis, Michael M, PA-C  traZODone  (DESYREL ) 100 MG tablet Take 100 mg by mouth at bedtime.    [provider]  Zachery Hermes INHUB 250-50 MCG/ACT AEPB Inhale 1 puff into the lungs 2 (two) times daily. 09/19/21   [provider]    Physical Exam: Vitals:   11/06/23 1337 11/06/23 1400 11/06/23 1722 11/06/23 1749  BP:  (!) 107/59 127/80   Pulse:  65 62   Resp:  18 16   Temp:    (!) 97.5 F (36.4 C)  TempSrc:    Oral  SpO2:  100% 98%   Weight: 77 kg     Height: 5\' 2"  (1.575 m)      Physical Examination:  General appearance - alert, well appearing, and in no distress Chest - clear to auscultation, no wheezes, rales or rhonchi, symmetric air entry Heart - normal rate, regular rhythm, normal S1, S2, no murmurs, rubs, clicks or gallops Abdomen - soft, nontender, nondistended, no masses or organomegaly Extremities - peripheral pulses normal, no pedal edema, no clubbing or cyanosis  Data Reviewed: Results for orders placed or performed during the hospital encounter of 11/06/23 (from the past 24 hours)  Basic metabolic panel     Status: Abnormal   Collection Time: 11/06/23  2:04 PM  Result Value Ref Range   Sodium 140 135 - 145 mmol/L   Potassium 4.2 3.5 - 5.1 mmol/L   Chloride 108 98 - 111 mmol/L   CO2 24 22 - 32 mmol/L   Glucose, Bld 126 (H) 70 - 99 mg/dL   BUN 32 (H) 8 - 23 mg/dL   Creatinine, Ser 0.45 (H) 0.44 - 1.00 mg/dL   Calcium  9.6 8.9 - 10.3 mg/dL   GFR, Estimated 33 (L) >60 mL/min   Anion gap 8 5 - 15  CBC     Status: Abnormal   Collection Time: 11/06/23  2:04 PM  Result Value Ref Range   WBC 5.6 4.0 - 10.5 K/uL   RBC 3.61 (L) 3.87 - 5.11 MIL/uL   Hemoglobin 11.0 (L) 12.0 - 15.0 g/dL   HCT 40.9 (L) 81.1 - 91.4 %   MCV 91.7 80.0 - 100.0 fL   MCH 30.5 26.0 - 34.0 pg    MCHC 33.2 30.0 - 36.0 g/dL   RDW 78.2 (H) 95.6 - 21.3 %   Platelets 133 (L) 150 - 400 K/uL   nRBC 0.4 (H) 0.0 - 0.2 %  Troponin I (High Sensitivity)     Status: None   Collection Time: 11/06/23  2:04 PM  Result Value Ref Range   Troponin I (High Sensitivity) 8 <18 ng/L  Resp panel by RT-PCR (RSV, Flu A&B, Covid) Anterior Nasal Swab     Status: None   Collection Time: 11/06/23  3:07 PM   Specimen: Anterior Nasal Swab  Result Value Ref Range   SARS Coronavirus 2 by RT PCR NEGATIVE NEGATIVE   Influenza A by PCR NEGATIVE NEGATIVE   Influenza B by PCR NEGATIVE NEGATIVE   Resp Syncytial Virus by PCR NEGATIVE NEGATIVE   CT Angio Chest PE W and/or Wo Contrast Result Date: 11/06/2023 CLINICAL DATA:  Colon cancer staging. EXAM: CT ANGIOGRAPHY CHEST CT ABDOMEN AND PELVIS WITH CONTRAST TECHNIQUE: Multidetector CT imaging of the chest was performed using the standard protocol during bolus administration of intravenous contrast. Multiplanar CT image reconstructions and MIPs were obtained to evaluate the vascular anatomy. Multidetector CT imaging of the abdomen and pelvis was performed using the standard protocol during bolus administration of intravenous contrast. RADIATION DOSE REDUCTION: This exam was performed according to the departmental dose-optimization program which includes automated exposure control, adjustment of the mA and/or kV according to patient size and/or use of iterative reconstruction technique. CONTRAST:  80mL OMNIPAQUE  IOHEXOL  350 MG/ML SOLN COMPARISON:  Chest radiograph dated 11/06/2023. chest CT dated 08/02/2023 and CT abdomen pelvis dated 07/22/2023. FINDINGS: CTA CHEST FINDINGS Cardiovascular: There is borderline cardiomegaly. Small pericardial effusion measuring 6 mm in thickness. The thoracic aorta is unremarkable. There is a saddle embolus straddling the bifurcation of the pulmonary trunk and extending into the central pulmonary arteries and bilateral upper and lower lobar branches.  No CT evidence of right heart straining. Mediastinum/Nodes: No hilar or mediastinal  adenopathy. The esophagus and the thyroid gland are grossly unremarkable. No mediastinal fluid collection. Lungs/Pleura: Minimal bibasilar subpleural atelectasis. No focal consolidation, pleural effusion or pneumothorax. The central airways are patent. Musculoskeletal: No acute osseous pathology. Degenerative changes of the spine. Review of the MIP images confirms the above findings. CT ABDOMEN and PELVIS FINDINGS No intra-abdominal free air or free fluid. Hepatobiliary: The liver is unremarkable. No biliary dilatation. Cholecystectomy. Pancreas: Unremarkable. No pancreatic ductal dilatation or surrounding inflammatory changes. Spleen: Normal in size without focal abnormality. Adrenals/Urinary Tract: The right adrenal gland is unremarkable. Nodular thickening of the lateral limb of the left adrenal gland measuring 9 mm in thickness. Mild bilateral renal parenchyma atrophy. There is no hydronephrosis on either side. There is symmetric enhancement and excretion of contrast by both kidneys. The visualized ureters and urinary bladder appear unremarkable. Stomach/Bowel: There is postsurgical changes of the bowel with anastomotic staple line in the right lower quadrant. There is no bowel obstruction or active inflammation. Appendectomy. Vascular/Lymphatic: Moderate aortoiliac atherosclerotic disease. The IVC is unremarkable. 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 The main portal vein is patent. The left portal vein branch is not visualized and is likely occluded. The right branch of the portal vein is patent. No portal venous gas. There is no adenopathy. Reproductive: Hysterectomy.  No suspicious adnexal mass. Other: Midline vertical anterior abdominal wall incisional scar. Musculoskeletal: Degenerative changes of the spine. Grade 1 L4-L5 anterolisthesis. No acute osseous  pathology. Review of the MIP images confirms the above findings. IMPRESSION: 1. Saddle embolus.  No CT evidence of right heart straining. 2. 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 3. Postsurgical changes of the bowel. No bowel obstruction. 4. No evidence of metastatic disease in the chest, abdomen, or pelvis. 5.  Aortic Atherosclerosis (ICD10-I70.0). These results were called by telephone at the time of interpretation on 11/06/2023 at 5:55 p.m. to provider DAVID YAO , who verbally acknowledged these results. Electronically Signed   By: Angus Bark M.D.   On: 11/06/2023 18:01   CT ABDOMEN PELVIS W CONTRAST Result Date: 11/06/2023 CLINICAL DATA:  Colon cancer staging. EXAM: CT ANGIOGRAPHY CHEST CT ABDOMEN AND PELVIS WITH CONTRAST TECHNIQUE: Multidetector CT imaging of the chest was performed using the standard protocol during bolus administration of intravenous contrast. Multiplanar CT image reconstructions and MIPs were obtained to evaluate the vascular anatomy. Multidetector CT imaging of the abdomen and pelvis was performed using the standard protocol during bolus administration of intravenous contrast. RADIATION DOSE REDUCTION: This exam was performed according to the departmental dose-optimization program which includes automated exposure control, adjustment of the mA and/or kV according to patient size and/or use of iterative reconstruction technique. CONTRAST:  80mL OMNIPAQUE  IOHEXOL  350 MG/ML SOLN COMPARISON:  Chest radiograph dated 11/06/2023. chest CT dated 08/02/2023 and CT abdomen pelvis dated 07/22/2023. FINDINGS: CTA CHEST FINDINGS Cardiovascular: There is borderline cardiomegaly. Small pericardial effusion measuring 6 mm in thickness. The thoracic aorta is unremarkable. There is a saddle embolus straddling the bifurcation of the pulmonary trunk and extending into the central pulmonary arteries and bilateral  upper and lower lobar branches. No CT evidence of right heart straining. Mediastinum/Nodes: No hilar or mediastinal adenopathy. The esophagus and the thyroid gland are grossly unremarkable. No mediastinal fluid collection. Lungs/Pleura: Minimal bibasilar subpleural atelectasis. No focal consolidation, pleural effusion or pneumothorax. The central airways are patent.  Musculoskeletal: No acute osseous pathology. Degenerative changes of the spine. Review of the MIP images confirms the above findings. CT ABDOMEN and PELVIS FINDINGS No intra-abdominal free air or free fluid. Hepatobiliary: The liver is unremarkable. No biliary dilatation. Cholecystectomy. Pancreas: Unremarkable. No pancreatic ductal dilatation or surrounding inflammatory changes. Spleen: Normal in size without focal abnormality. Adrenals/Urinary Tract: The right adrenal gland is unremarkable. Nodular thickening of the lateral limb of the left adrenal gland measuring 9 mm in thickness. Mild bilateral renal parenchyma atrophy. There is no hydronephrosis on either side. There is symmetric enhancement and excretion of contrast by both kidneys. The visualized ureters and urinary bladder appear unremarkable. Stomach/Bowel: There is postsurgical changes of the bowel with anastomotic staple line in the right lower quadrant. There is no bowel obstruction or active inflammation. Appendectomy. Vascular/Lymphatic: Moderate aortoiliac atherosclerotic disease. The IVC is unremarkable. 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 The main portal vein is patent. The left portal vein branch is not visualized and is likely occluded. The right branch of the portal vein is patent. No portal venous gas. There is no adenopathy. Reproductive: Hysterectomy.  No suspicious adnexal mass. Other: Midline vertical anterior abdominal wall incisional scar. Musculoskeletal: Degenerative changes of the spine. Grade 1 L4-L5  anterolisthesis. No acute osseous pathology. Review of the MIP images confirms the above findings. IMPRESSION: 1. Saddle embolus.  No CT evidence of right heart straining. 2. 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 3. Postsurgical changes of the bowel. No bowel obstruction. 4. No evidence of metastatic disease in the chest, abdomen, or pelvis. 5.  Aortic Atherosclerosis (ICD10-I70.0). These results were called by telephone at the time of interpretation on 11/06/2023 at 5:55 p.m. to provider DAVID YAO , who verbally acknowledged these results. Electronically Signed   By: Angus Bark M.D.   On: 11/06/2023 18:01   DG Chest 2 View Result Date: 11/06/2023 CLINICAL DATA:  Chest pain and shortness of breath.  Colon cancer. EXAM: CHEST - 2 VIEW COMPARISON:  Two-view chest x-ray 07/24/2017.  CT chest 08/02/2023 FINDINGS: Heart size is normal. Lungs are clear. No edema or effusion is present. No focal airspace disease present. Mild degenerative changes are noted in the thoracic spine. Cervical spine surgery noted. IMPRESSION: No acute cardiopulmonary disease. Electronically Signed   By: Audree Leas M.D.   On: 11/06/2023 14:43   DG ESOPHAGUS W DOUBLE CM (HD) Result Date: 10/18/2023 CLINICAL DATA:  Dysphagia.  Gastroesophageal reflux disease. EXAM: ESOPHAGUS/BARIUM SWALLOW/TABLET STUDY TECHNIQUE: Combined double and single contrast examination was performed using effervescent crystals, high-density barium, and thin liquid barium. FLUOROSCOPY: Radiation Exposure Index (as provided by the fluoroscopic device): 7.8 mGy Kerma COMPARISON:  Esophagram 10/30/2015 FINDINGS: Swallowing: Appears normal. No vestibular penetration or aspiration seen. Pharynx: Unremarkable. Esophagus: Normal appearance. Esophageal motility: Mild esophageal dysmotility. No significant tertiary contractions. Hiatal Hernia: The previously shown small  sliding hiatal hernia was not readily apparent during today's examination. Gastroesophageal reflux: None visualized. Ingested 13 mm barium tablet: Passed normally. IMPRESSION: Mild esophageal dysmotility, otherwise unremarkable esophagram. Electronically Signed   By: Aundra Lee M.D.   On: 10/18/2023 14:39    Assessment and Plan: * Pulmonary embolism (HCC) Acute with another clot in the SMV. Heparin drip per pharmacy protocol Will transition to oral anticoagulants as needed.  Hyperlipidemia Continue Zocor   Thrombocytopenia (HCC) Platelets are 133 this is  lower than they had been. Will monitor given heparin drip.  Stage 3b chronic kidney disease (HCC) Creatinine is 1.56 today.  This is higher than her baseline creatinine is 0.96 Hold nephrotoxic agents Trend  Hypersomnia with sleep apnea Has sleep apnea Continue CPAP at at bedtime  Gastroesophageal reflux disease Continue PPI  Essential hypertension Continue Coreg  Continue spironolactone  BP was mildly elevated in the ED today she had previously been on prazosin and an ARB both of which caused such dramatic hypotension that she has had to stop these.  Depressive disorder Continue Zoloft  and trazodone   COPD (chronic obstructive pulmonary disease) (HCC) Continue preventative inhalers Continue albuterol  for rescue  Colon cancer (HCC) On oral chemo Has follow-up with oncology Will let Dr. Scherrie Curt know if she is admitted.      Advance Care Planning:   Code Status: Prior DNR reviewed with patient.  She felt very strongly about this  Consults: None  Family Communication: Patient at bedside  Severity of Illness: The appropriate patient status for this patient is INPATIENT. Inpatient status is judged to be reasonable and necessary in order to provide the required intensity of service to ensure the patient's safety. The patient's presenting symptoms, physical exam findings, and initial radiographic and laboratory data in  the context of their chronic comorbidities is felt to place them at high risk for further clinical deterioration. Furthermore, it is not anticipated that the patient will be medically stable for discharge from the hospital within 2 midnights of admission.   * I certify that at the point of admission it is my clinical judgment that the patient will require inpatient hospital care spanning beyond 2 midnights from the point of admission due to high intensity of service, high risk for further deterioration and high frequency of surveillance required.*  Author: Granville Layer, MD 11/06/2023 6:31 PM  For on call review [email or web address].

## 2023-11-06 NOTE — Assessment & Plan Note (Addendum)
 On oral chemo Has follow-up with oncology Will let Dr. Scherrie Curt know if she is admitted.

## 2023-11-06 NOTE — Assessment & Plan Note (Signed)
 Has sleep apnea Continue CPAP at at bedtime

## 2023-11-06 NOTE — Progress Notes (Signed)
   11/06/23 2224  BiPAP/CPAP/SIPAP  $ Non-Invasive Home Ventilator  Initial  $ Face Mask Medium Yes  BiPAP/CPAP/SIPAP Pt Type Adult  BiPAP/CPAP/SIPAP DREAMSTATIOND  Mask Type Full face mask  Dentures removed? Yes - Placed in denture cup  Mask Size Medium  Respiratory Rate 18 breaths/min  EPAP 8 cmH2O  Patient Home Machine No  Patient Home Mask No  Patient Home Tubing No  Auto Titrate No  Nasal massage performed Yes  Device Plugged into RED Power Outlet Yes  BiPAP/CPAP /SiPAP Vitals  Pulse Rate 76  Resp 18  SpO2 96 %  Bilateral Breath Sounds Diminished  MEWS Score/Color  MEWS Score 0  MEWS Score Color Marrie Sizer

## 2023-11-06 NOTE — Assessment & Plan Note (Signed)
 Continue preventative inhalers Continue albuterol  for rescue

## 2023-11-06 NOTE — Assessment & Plan Note (Signed)
 Acute with another clot in the SMV. Heparin drip per pharmacy protocol Will transition to oral anticoagulants as needed.

## 2023-11-06 NOTE — Assessment & Plan Note (Signed)
 Platelets are 133 this is lower than they had been. Will monitor given heparin drip.

## 2023-11-06 NOTE — Assessment & Plan Note (Signed)
--  Continue Zoloft and trazodone

## 2023-11-06 NOTE — ED Triage Notes (Signed)
 Pt reports cp/shob for a few weeks. States she has history of colon cancer, recently completed oral chemo. Denies stomach pain at this time. States soreness at site where surgery was done

## 2023-11-06 NOTE — Assessment & Plan Note (Signed)
 Continue Zocor

## 2023-11-06 NOTE — Hospital Course (Signed)
 Patient is a 83 year old with history of HTN, HLD, GERD and colon cancer who was on oral chemotherapeutics who presents with a 2-week history of weakness, shortness of breath, dizziness.  Patient has been on oral chemo and thought her symptoms might be only related to that but her shortness of breath became worse with just minimal exertion and she finally decided to come in and have it checked out. Patient has a history of COPD from remote smoking history and is on multiple inhalers at home. In the ED patient had a negative respiratory panel negative troponins and a CT that showed a saddle embolism.  CT of the abdomen also showed nonocclusive clot in the superior mesenteric vein. The patient was started on heparin drip and we were asked to admit.

## 2023-11-07 ENCOUNTER — Telehealth (HOSPITAL_COMMUNITY): Payer: Self-pay | Admitting: Pharmacy Technician

## 2023-11-07 ENCOUNTER — Other Ambulatory Visit (HOSPITAL_COMMUNITY): Payer: Self-pay

## 2023-11-07 ENCOUNTER — Other Ambulatory Visit: Payer: Self-pay

## 2023-11-07 DIAGNOSIS — N1832 Chronic kidney disease, stage 3b: Secondary | ICD-10-CM

## 2023-11-07 DIAGNOSIS — I2692 Saddle embolus of pulmonary artery without acute cor pulmonale: Secondary | ICD-10-CM | POA: Diagnosis not present

## 2023-11-07 DIAGNOSIS — K219 Gastro-esophageal reflux disease without esophagitis: Secondary | ICD-10-CM | POA: Diagnosis not present

## 2023-11-07 DIAGNOSIS — I10 Essential (primary) hypertension: Secondary | ICD-10-CM | POA: Diagnosis not present

## 2023-11-07 DIAGNOSIS — D649 Anemia, unspecified: Secondary | ICD-10-CM | POA: Insufficient documentation

## 2023-11-07 DIAGNOSIS — D696 Thrombocytopenia, unspecified: Secondary | ICD-10-CM

## 2023-11-07 DIAGNOSIS — G4730 Sleep apnea, unspecified: Secondary | ICD-10-CM

## 2023-11-07 DIAGNOSIS — C184 Malignant neoplasm of transverse colon: Secondary | ICD-10-CM | POA: Diagnosis not present

## 2023-11-07 DIAGNOSIS — G4710 Hypersomnia, unspecified: Secondary | ICD-10-CM

## 2023-11-07 DIAGNOSIS — K55069 Acute infarction of intestine, part and extent unspecified: Secondary | ICD-10-CM | POA: Insufficient documentation

## 2023-11-07 DIAGNOSIS — E669 Obesity, unspecified: Secondary | ICD-10-CM | POA: Insufficient documentation

## 2023-11-07 LAB — CBC
HCT: 30.1 % — ABNORMAL LOW (ref 36.0–46.0)
Hemoglobin: 10.1 g/dL — ABNORMAL LOW (ref 12.0–15.0)
MCH: 30.7 pg (ref 26.0–34.0)
MCHC: 33.6 g/dL (ref 30.0–36.0)
MCV: 91.5 fL (ref 80.0–100.0)
Platelets: 129 10*3/uL — ABNORMAL LOW (ref 150–400)
RBC: 3.29 MIL/uL — ABNORMAL LOW (ref 3.87–5.11)
RDW: 27.2 % — ABNORMAL HIGH (ref 11.5–15.5)
WBC: 5.8 10*3/uL (ref 4.0–10.5)
nRBC: 0 % (ref 0.0–0.2)

## 2023-11-07 LAB — BASIC METABOLIC PANEL WITH GFR
Anion gap: 7 (ref 5–15)
BUN: 26 mg/dL — ABNORMAL HIGH (ref 8–23)
CO2: 23 mmol/L (ref 22–32)
Calcium: 9.5 mg/dL (ref 8.9–10.3)
Chloride: 111 mmol/L (ref 98–111)
Creatinine, Ser: 1.27 mg/dL — ABNORMAL HIGH (ref 0.44–1.00)
GFR, Estimated: 42 mL/min — ABNORMAL LOW (ref >60)
Glucose, Bld: 115 mg/dL — ABNORMAL HIGH (ref 70–99)
Potassium: 3.7 mmol/L (ref 3.5–5.1)
Sodium: 141 mmol/L (ref 135–145)

## 2023-11-07 LAB — HEPARIN LEVEL (UNFRACTIONATED)
Heparin Unfractionated: 0.62 [IU]/mL (ref 0.30–0.70)
Heparin Unfractionated: 0.76 [IU]/mL — ABNORMAL HIGH (ref 0.30–0.70)
Heparin Unfractionated: 0.64 [IU]/mL (ref 0.30–0.70)

## 2023-11-07 NOTE — Progress Notes (Signed)
 Transition of Care [facility]) - Inpatient Brief Assessment  Patient Details  Name: Adrienne Rogers MRN: [id number] Date of Birth: 1940/07/26  [facility] CM/SW Contact:    Zenon Hilda, LCSW Phone Number: 11/07/2023, 10:15 AM  Clinical Narrative: TOC screening done. No needs identified at this time.  Transition of Care Asessment: Insurance and Status: Insurance coverage has been reviewed Patient has primary care physician: Yes Home environment has been reviewed: Resides alone in single family home Prior level of function:: Independent with ADLs at baseline Prior/Current Home Services: Current home services [facility] aide) Social Drivers of Health Review: SDOH reviewed no interventions necessary Readmission risk has been reviewed: Yes Transition of care needs: no transition of care needs at this time    11/07/23 1015  Readmission Prevention Plan - High Risk  Transportation Screening Complete  Home Care Consult (High Risk) Complete  High Risk Social Work Consult for recovery care planning/counseling (includes patient and caregiver) Complete  High Risk Palliative Care Screening Not Applicable  Medication Review Complete

## 2023-11-07 NOTE — Progress Notes (Signed)
 PHARMACY - ANTICOAGULATION CONSULT NOTE  Pharmacy Consult for IV UFH Indication: pulmonary embolus  Allergies  Allergen Reactions   Codeine Itching, Rash and Other (See Comments)   Lisinopril Other (See Comments)    Tinnitus   Ezetimibe-Simvastatin  Other (See Comments)    Muscle Pain   Fosamax  [Alendronate Sodium] Other (See Comments)    Heartburn   Sulfa Antibiotics Hives, Itching, Rash and Other (See Comments)    ITCHING,WATERING EYES   Bupropion  Other (See Comments)    vertigo   Doxepin Swelling   Prazosin Other (See Comments)    Low blood pressure   Tetracyclines & Related Other (See Comments) and Nausea And Vomiting    Swelling eruption of the lips    Patient Measurements: Height: 5\' 2"  (157.5 cm) Weight: 77 kg (169 lb 12.1 oz) IBW/kg (Calculated) : 50.1 HEPARIN DW (KG): 66.9  Vital Signs: Temp: 98.7 F (37.1 C) (05/05 2001) Temp Source: Oral (05/05 1307) BP: 177/75 (05/05 2001) Pulse Rate: 65 (05/05 2001)  Labs: Recent Labs    11/06/23 1404 11/06/23 2139 11/07/23 0326 11/07/23 1101  HGB 11.0*  --  10.1*  --   HCT 33.1*  --  30.1*  --   PLT 133*  --  129*  --   HEPARINUNFRC  --   --  0.62 0.76*  CREATININE 1.56*  --  1.27*  --   TROPONINIHS 8 19*  --   --     Estimated Creatinine Clearance: 33.4 mL/min (A) (by C-G formula based on SCr of 1.27 mg/dL (H)).   Medical History: Past Medical History:  Diagnosis Date   Asthma    COPD (chronic obstructive pulmonary disease) (HCC)    Depression    Hyperlipidemia    Hypertension    PTSD (post-traumatic stress disorder)      Assessment: 83 year old female presented with shortness of breath, imaging with saddle embolus as well as nonocclusive thrombus in the SMV. Pharmacy consulted to start heparin infusion.   Baseline CBC: Hgb 11, Plt 133. Not on anticoagulation prior to admission.  11/07/2023 @ 2116 -HL 0.64 - therapeutic on heparin 900 units/hr -CBC stable -No bleeding or infusion related issues  reported by RN  Goal of Therapy:  Heparin level 0.3-0.7 units/ml Monitor platelets by anticoagulation protocol: Yes   Plan:  -Decrease heparin infusion to 900 units/hr -Recheck heparin level in 8 hours with daily labs -Daily CBC and heparin level -Note plan to transition to oral anticoagulation after 24-48 hrs of heparin therapy  Arie Kurtz, PharmD, BCPS Clinical Pharmacist 11/07/2023 9:29 PM

## 2023-11-07 NOTE — Progress Notes (Signed)
 Progress Note   Patient: Adrienne Rogers WGN:[id number] DOB: 1940/10/01 DOA: 11/06/2023     1 DOS: the patient was seen and examined on 11/07/2023   Brief hospital course: Patient is a 83 year old with history of HTN, HLD, GERD and colon cancer who was on oral chemotherapeutics who presents with a 2-week history of weakness, shortness of breath, dizziness.  Patient has been on oral chemo and thought her symptoms might be only related to that but her shortness of breath became worse with just minimal exertion and she finally decided to come in and have it checked out. Patient has a history of COPD from remote smoking history and is on multiple inhalers at home. In the ED patient had a negative respiratory panel negative troponins and a CT that showed a saddle embolism.  CT of the abdomen also showed nonocclusive clot in the superior mesenteric vein. The patient was started on heparin drip and we were asked to admit.  Assessment and Plan: * Pulmonary embolism (HCC) Acute with another clot in the SMV. Heparin drip per pharmacy protocol. Monitor PT/ aPTT. Discussed with vascular surgery, no acute intervention needed. If hemodynamic unstable advised to call IR. Check echo, lower extremity doppler. Plan to continue heparin drip for 24-48 hrs before changing to oral anticoagulation.  Hyperlipidemia Continue Zocor .  Thrombocytopenia (HCC) Platelets are 133 this is lower than they had been. Continue to monitor on heparin drip. Watch for bleeding.  Stage 3b chronic kidney disease (HCC) Stable. Creatinine seems around 1.27 Hold nephrotoxic agents Monitor renal function.  Anemia of chronic disease. Hb stable. Watch H/H closely due to heparin drip. Discussed about risks of bleeding on heparin drip.  Hypersomnia with sleep apnea Continue CPAP at at bedtime  Gastroesophageal reflux disease Continue PPI daily.  Essential hypertension BP elevated. Continue Coreg ,  spironolactone   Depressive disorder Continue Zoloft  and trazodone   COPD (chronic obstructive pulmonary disease) (HCC) Continue preventative inhalers Continue albuterol  for rescue  Colon cancer (HCC) On oral chemo. Has follow-up with oncology Will let Dr. Scherrie Curt know of her admission.  Obesity Class 1. BMI 31.05- Diet, exercise and weight reduction advised.    Out of bed to chair. Incentive spirometry. Nursing supportive care. Fall, aspiration precautions. Diet:  Diet Orders (From admission, onward)     Start     Ordered   11/06/23 1946  Diet Heart Room service appropriate? Yes; Fluid consistency: Thin  Diet effective now       Question Answer Comment  Room service appropriate? Yes   Fluid consistency: Thin      11/06/23 1946           DVT prophylaxis:   Level of care: Telemetry   Code Status: Limited: Do not attempt resuscitation (DNR) -DNR-LIMITED -Do Not Intubate/DNI   Subjective: Patient is seen and examined today morning. She is lying comfortably. Denies dizziness, chest pain or shortness of breath. Has poor appetite. Tolerating heparin well.  Physical Exam: Vitals:   11/07/23 0132 11/07/23 0531 11/07/23 0744 11/07/23 0819  BP: (!) 114/59 (!) 142/84  (!) 155/80  Pulse: (!) 58 (!) 57  (!) 58  Resp: 18 19  17   Temp: 98.5 F (36.9 C) 98.4 F (36.9 C)  98 F (36.7 C)  TempSrc: Oral Oral  Oral  SpO2: 98% 99% 95% 99%  Weight:      Height:        General - Elderly obese African American female, no apparent distress HEENT - PERRLA, EOMI, atraumatic head, non tender  sinuses. Lung - Clear, basal rales, no rhonchi, wheezes. Heart - S1, S2 heard, no murmurs, rubs, trace pedal edema. Abdomen - Soft, non tender, bowel sounds good Neuro - Alert, awake and oriented x 3, non focal exam. Skin - Warm and dry.  Data Reviewed:      Latest Ref Rng & Units 11/07/2023    3:26 AM 11/06/2023    2:04 PM 10/19/2023   10:25 AM  CBC  WBC 4.0 - 10.5 K/uL 5.8  5.6  4.3    Hemoglobin 12.0 - 15.0 g/dL 16.1  09.6  04.5   Hematocrit 36.0 - 46.0 % 30.1  33.1  32.5   Platelets 150 - 400 K/uL 129  133  175       Latest Ref Rng & Units 11/07/2023    3:26 AM 11/06/2023    2:04 PM 10/19/2023   10:25 AM  BMP  Glucose 70 - 99 mg/dL 409  811  914   BUN 8 - 23 mg/dL 26  32  34   Creatinine 0.44 - 1.00 mg/dL 7.82  9.56  2.13   Sodium 135 - 145 mmol/L 141  140  141   Potassium 3.5 - 5.1 mmol/L 3.7  4.2  4.1   Chloride 98 - 111 mmol/L 111  108  107   CO2 22 - 32 mmol/L 23  24  25    Calcium  8.9 - 10.3 mg/dL 9.5  9.6  9.4    CT Angio Chest PE W and/or Wo Contrast Result Date: 11/06/2023 CLINICAL DATA:  Colon cancer staging. EXAM: CT ANGIOGRAPHY CHEST CT ABDOMEN AND PELVIS WITH CONTRAST TECHNIQUE: Multidetector CT imaging of the chest was performed using the standard protocol during bolus administration of intravenous contrast. Multiplanar CT image reconstructions and MIPs were obtained to evaluate the vascular anatomy. Multidetector CT imaging of the abdomen and pelvis was performed using the standard protocol during bolus administration of intravenous contrast. RADIATION DOSE REDUCTION: This exam was performed according to the departmental dose-optimization program which includes automated exposure control, adjustment of the mA and/or kV according to patient size and/or use of iterative reconstruction technique. CONTRAST:  80mL OMNIPAQUE  IOHEXOL  350 MG/ML SOLN COMPARISON:  Chest radiograph dated 11/06/2023. chest CT dated 08/02/2023 and CT abdomen pelvis dated 07/22/2023. FINDINGS: CTA CHEST FINDINGS Cardiovascular: There is borderline cardiomegaly. Small pericardial effusion measuring 6 mm in thickness. The thoracic aorta is unremarkable. There is a saddle embolus straddling the bifurcation of the pulmonary trunk and extending into the central pulmonary arteries and bilateral upper and lower lobar branches. No CT evidence of right heart straining. Mediastinum/Nodes: No hilar or  mediastinal adenopathy. The esophagus and the thyroid gland are grossly unremarkable. No mediastinal fluid collection. Lungs/Pleura: Minimal bibasilar subpleural atelectasis. No focal consolidation, pleural effusion or pneumothorax. The central airways are patent. Musculoskeletal: No acute osseous pathology. Degenerative changes of the spine. Review of the MIP images confirms the above findings. CT ABDOMEN and PELVIS FINDINGS No intra-abdominal free air or free fluid. Hepatobiliary: The liver is unremarkable. No biliary dilatation. Cholecystectomy. Pancreas: Unremarkable. No pancreatic ductal dilatation or surrounding inflammatory changes. Spleen: Normal in size without focal abnormality. Adrenals/Urinary Tract: The right adrenal gland is unremarkable. Nodular thickening of the lateral limb of the left adrenal gland measuring 9 mm in thickness. Mild bilateral renal parenchyma atrophy. There is no hydronephrosis on either side. There is symmetric enhancement and excretion of contrast by both kidneys. The visualized ureters and urinary bladder appear unremarkable. Stomach/Bowel: There is postsurgical changes of the  bowel with anastomotic staple line in the right lower quadrant. There is no bowel obstruction or active inflammation. Appendectomy. Vascular/Lymphatic: Moderate aortoiliac atherosclerotic disease. The IVC is unremarkable. 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 The main portal vein is patent. The left portal vein branch is not visualized and is likely occluded. The right branch of the portal vein is patent. No portal venous gas. There is no adenopathy. Reproductive: Hysterectomy.  No suspicious adnexal mass. Other: Midline vertical anterior abdominal wall incisional scar. Musculoskeletal: Degenerative changes of the spine. Grade 1 L4-L5 anterolisthesis. No acute osseous pathology. Review of the MIP images confirms the above findings. IMPRESSION:  1. Saddle embolus.  No CT evidence of right heart straining. 2. 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 3. Postsurgical changes of the bowel. No bowel obstruction. 4. No evidence of metastatic disease in the chest, abdomen, or pelvis. 5.  Aortic Atherosclerosis (ICD10-I70.0). These results were called by telephone at the time of interpretation on 11/06/2023 at 5:55 p.m. to provider DAVID YAO , who verbally acknowledged these results. Electronically Signed   By: Angus Bark M.D.   On: 11/06/2023 18:01   CT ABDOMEN PELVIS W CONTRAST Result Date: 11/06/2023 CLINICAL DATA:  Colon cancer staging. EXAM: CT ANGIOGRAPHY CHEST CT ABDOMEN AND PELVIS WITH CONTRAST TECHNIQUE: Multidetector CT imaging of the chest was performed using the standard protocol during bolus administration of intravenous contrast. Multiplanar CT image reconstructions and MIPs were obtained to evaluate the vascular anatomy. Multidetector CT imaging of the abdomen and pelvis was performed using the standard protocol during bolus administration of intravenous contrast. RADIATION DOSE REDUCTION: This exam was performed according to the departmental dose-optimization program which includes automated exposure control, adjustment of the mA and/or kV according to patient size and/or use of iterative reconstruction technique. CONTRAST:  80mL OMNIPAQUE  IOHEXOL  350 MG/ML SOLN COMPARISON:  Chest radiograph dated 11/06/2023. chest CT dated 08/02/2023 and CT abdomen pelvis dated 07/22/2023. FINDINGS: CTA CHEST FINDINGS Cardiovascular: There is borderline cardiomegaly. Small pericardial effusion measuring 6 mm in thickness. The thoracic aorta is unremarkable. There is a saddle embolus straddling the bifurcation of the pulmonary trunk and extending into the central pulmonary arteries and bilateral upper and lower lobar branches. No CT evidence of right heart straining.  Mediastinum/Nodes: No hilar or mediastinal adenopathy. The esophagus and the thyroid gland are grossly unremarkable. No mediastinal fluid collection. Lungs/Pleura: Minimal bibasilar subpleural atelectasis. No focal consolidation, pleural effusion or pneumothorax. The central airways are patent. Musculoskeletal: No acute osseous pathology. Degenerative changes of the spine. Review of the MIP images confirms the above findings. CT ABDOMEN and PELVIS FINDINGS No intra-abdominal free air or free fluid. Hepatobiliary: The liver is unremarkable. No biliary dilatation. Cholecystectomy. Pancreas: Unremarkable. No pancreatic ductal dilatation or surrounding inflammatory changes. Spleen: Normal in size without focal abnormality. Adrenals/Urinary Tract: The right adrenal gland is unremarkable. Nodular thickening of the lateral limb of the left adrenal gland measuring 9 mm in thickness. Mild bilateral renal parenchyma atrophy. There is no hydronephrosis on either side. There is symmetric enhancement and excretion of contrast by both kidneys. The visualized ureters and urinary bladder appear unremarkable. Stomach/Bowel: There is postsurgical changes of the bowel with anastomotic staple line in the right lower quadrant. There is no bowel obstruction or active inflammation. Appendectomy. Vascular/Lymphatic: Moderate aortoiliac atherosclerotic disease. The IVC is unremarkable. There is a  nonocclusive thrombus in the SMV extending to the junction of the SMV and main portal vein. The main portal vein is patent. The left portal vein branch is not visualized and is likely occluded. The right branch of the portal vein is patent. No portal venous gas. There is no adenopathy. Reproductive: Hysterectomy.  No suspicious adnexal mass. Other: Midline vertical anterior abdominal wall incisional scar. Musculoskeletal: Degenerative changes of the spine. Grade 1 L4-L5 anterolisthesis. No acute osseous pathology. Review of the MIP images confirms  the above findings. IMPRESSION: 1. Saddle embolus.  No CT evidence of right heart straining. 2. 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 3. Postsurgical changes of the bowel. No bowel obstruction. 4. No evidence of metastatic disease in the chest, abdomen, or pelvis. 5.  Aortic Atherosclerosis (ICD10-I70.0). These results were called by telephone at the time of interpretation on 11/06/2023 at 5:55 p.m. to provider DAVID YAO , who verbally acknowledged these results. Electronically Signed   By: Angus Bark M.D.   On: 11/06/2023 18:01   DG Chest 2 View Result Date: 11/06/2023 CLINICAL DATA:  Chest pain and shortness of breath.  Colon cancer. EXAM: CHEST - 2 VIEW COMPARISON:  Two-view chest x-ray 07/24/2017.  CT chest 08/02/2023 FINDINGS: Heart size is normal. Lungs are clear. No edema or effusion is present. No focal airspace disease present. Mild degenerative changes are noted in the thoracic spine. Cervical spine surgery noted. IMPRESSION: No acute cardiopulmonary disease. Electronically Signed   By: Audree Leas M.D.   On: 11/06/2023 14:43    Family Communication: Discussed with patient, daughter. They understand and agree. All questions answered.  Disposition: Status is: Inpatient Remains inpatient appropriate because: heparin drip, PT eval.  Planned Discharge Destination: Home with Home Health     MDM level 3 - Patient has saddle embolus, SMV clot need heparin drip, close hemodynamic, neurologic monitoring. She is on high risk medication and is at risk for sudden clinical deterioration.  Author: Aisha Hove, MD 11/07/2023 10:37 AM Secure chat 7am to 7pm For on call review [email or web address].

## 2023-11-07 NOTE — Progress Notes (Signed)
 Mobility Specialist - Progress Note   11/07/23 1158  Mobility  Activity Transferred from bed to chair  Level of Assistance Standby assist, set-up cues, supervision of patient - no hands on  Assistive Device Front wheel walker  Range of Motion/Exercises Active  Activity Response Tolerated well  Mobility Referral Yes  Mobility visit 1 Mobility  Mobility Specialist Start Time (ACUTE ONLY) 1148  Mobility Specialist Stop Time (ACUTE ONLY) 1158  Mobility Specialist Time Calculation (min) (ACUTE ONLY) 10 min   Pt was found in bed and agreeable to transfer to recliner chair, declined ambulation. No complaints and was left on recliner chair with all need met. Call bell in reach.  Lorna Rose Mobility Specialist

## 2023-11-07 NOTE — Progress Notes (Signed)
 PHARMACY - ANTICOAGULATION CONSULT NOTE  Pharmacy Consult for IV UFH Indication: pulmonary embolus  Allergies  Allergen Reactions   Codeine Itching, Rash and Other (See Comments)   Lisinopril Other (See Comments)    Tinnitus   Ezetimibe-Simvastatin  Other (See Comments)    Muscle Pain   Fosamax  [Alendronate Sodium] Other (See Comments)    Heartburn   Sulfa Antibiotics Hives, Itching, Rash and Other (See Comments)    ITCHING,WATERING EYES   Bupropion  Other (See Comments)    vertigo   Doxepin Swelling   Prazosin Other (See Comments)    Low blood pressure   Tetracyclines & Related Other (See Comments) and Nausea And Vomiting    Swelling eruption of the lips    Patient Measurements: Height: 5\' 2"  (157.5 cm) Weight: 77 kg (169 lb 12.1 oz) IBW/kg (Calculated) : 50.1 HEPARIN DW (KG): 66.9  Vital Signs: Temp: 98 F (36.7 C) (05/05 0819) Temp Source: Oral (05/05 0819) BP: 155/80 (05/05 0819) Pulse Rate: 58 (05/05 0819)  Labs: Recent Labs    11/06/23 1404 11/06/23 2139 11/07/23 0326 11/07/23 1101  HGB 11.0*  --  10.1*  --   HCT 33.1*  --  30.1*  --   PLT 133*  --  129*  --   HEPARINUNFRC  --   --  0.62 0.76*  CREATININE 1.56*  --  1.27*  --   TROPONINIHS 8 19*  --   --     Estimated Creatinine Clearance: 33.4 mL/min (A) (by C-G formula based on SCr of 1.27 mg/dL (H)).   Medical History: Past Medical History:  Diagnosis Date   Asthma    COPD (chronic obstructive pulmonary disease) (HCC)    Depression    Hyperlipidemia    Hypertension    PTSD (post-traumatic stress disorder)      Assessment: 83 year old female presented with shortness of breath, imaging with saddle embolus as well as nonocclusive thrombus in the SMV. Pharmacy consulted to start heparin infusion.   Baseline CBC: Hgb 11, Plt 133. Not on anticoagulation prior to admission.  11/07/2023 -HL 0.76 - supratherapeutic on heparin 1000 units/hr -CBC stable -No complications of therapy noted  Goal  of Therapy:  Heparin level 0.3-0.7 units/ml Monitor platelets by anticoagulation protocol: Yes   Plan:  -Decrease heparin infusion to 900 units/hr -Recheck heparin level in 8 hours -Daily CBC and heparin level -Note plan to transition to oral anticoagulation after 24-48 hrs of heparin therapy  Lolita Rise, PharmD, BCPS Clinical Pharmacist 11/07/2023 11:51 AM

## 2023-11-07 NOTE — Plan of Care (Signed)

## 2023-11-07 NOTE — Progress Notes (Signed)
 PHARMACY - ANTICOAGULATION CONSULT NOTE  Pharmacy Consult for IV UFH Indication: pulmonary embolus  Allergies  Allergen Reactions   Codeine Itching, Rash and Other (See Comments)   Lisinopril Other (See Comments)    Tinnitus   Ezetimibe-Simvastatin  Other (See Comments)    Muscle Pain   Fosamax  [Alendronate Sodium] Other (See Comments)    Heartburn   Sulfa Antibiotics Hives, Itching, Rash and Other (See Comments)    ITCHING,WATERING EYES   Bupropion  Other (See Comments)    vertigo   Doxepin Swelling   Prazosin Other (See Comments)    Low blood pressure   Tetracyclines & Related Other (See Comments) and Nausea And Vomiting    Swelling eruption of the lips    Patient Measurements: Height: 5\' 2"  (157.5 cm) Weight: 77 kg (169 lb 12.1 oz) IBW/kg (Calculated) : 50.1 HEPARIN DW (KG): 66.9  Vital Signs: Temp: 98.5 F (36.9 C) (05/05 0132) Temp Source: Oral (05/05 0132) BP: 114/59 (05/05 0132) Pulse Rate: 58 (05/05 0132)  Labs: Recent Labs    11/06/23 1404 11/06/23 2139 11/07/23 0326  HGB 11.0*  --  10.1*  HCT 33.1*  --  30.1*  PLT 133*  --  129*  HEPARINUNFRC  --   --  0.62  CREATININE 1.56*  --  1.27*  TROPONINIHS 8 19*  --     Estimated Creatinine Clearance: 33.4 mL/min (A) (by C-G formula based on SCr of 1.27 mg/dL (H)).   Medical History: Past Medical History:  Diagnosis Date   Asthma    COPD (chronic obstructive pulmonary disease) (HCC)    Depression    Hyperlipidemia    Hypertension    PTSD (post-traumatic stress disorder)     Medications:  Scheduled:  Infusions:   Assessment: Patient presented to ER with shortness of breath now to start IV heparin for new PE. Baseline CBC: Hgb 11, Plt 133. SCr 1.56 CrCl 27. Not on anticoagulation prior to admission  11/07/2023 HL 0.62 therapeutic on 1000 units/hr Hgb 10.1, plts 129, Scr 1.27 No bleeding noted  Goal of Therapy:  Heparin level 0.3-0.7 units/ml Monitor platelets by anticoagulation protocol:  Yes   Plan:  Continue heparin drip at 1000 units/hr Confirmatory heparin level in 8 hours Daily CBC and heparin level  Beau Bound RPh 11/07/2023, 3:56 AM    Fontaine Ice E 11/07/2023,3:55 AM

## 2023-11-07 NOTE — Telephone Encounter (Signed)
 Patient Product/process development scientist completed.    The patient is insured through [employer]. Patient has [employer], may use a copay card, and/or apply for patient assistance if available.    Ran test claim for Eliquis 5 mg and the current 30 day co-pay is $50.00.  Ran test claim for Xarelto 20 mg and the current 30 day co-pay is $50.00.  This test claim was processed through [facility]- copay amounts may vary at other pharmacies due to pharmacy/plan contracts, or as the patient moves through the different stages of their insurance plan.     Morgan Arab, CPHT Pharmacy Technician III Certified Patient Advocate [facility] Patient Advocate Team Direct Number: [phone]  Fax: [phone]

## 2023-11-07 NOTE — Progress Notes (Signed)
   11/07/23 2254  BiPAP/CPAP/SIPAP  BiPAP/CPAP/SIPAP Pt Type Adult  BiPAP/CPAP/SIPAP DREAMSTATIOND  Mask Type Full face mask  Mask Size Medium  Respiratory Rate 18 breaths/min  EPAP 8 cmH2O  Patient Home Machine No  Patient Home Mask No  Patient Home Tubing No  Auto Titrate No  Device Plugged into RED Power Outlet Yes

## 2023-11-08 ENCOUNTER — Encounter (HOSPITAL_COMMUNITY)

## 2023-11-08 ENCOUNTER — Other Ambulatory Visit (HOSPITAL_COMMUNITY)

## 2023-11-08 ENCOUNTER — Inpatient Hospital Stay (HOSPITAL_COMMUNITY)

## 2023-11-08 DIAGNOSIS — C184 Malignant neoplasm of transverse colon: Secondary | ICD-10-CM

## 2023-11-08 DIAGNOSIS — K219 Gastro-esophageal reflux disease without esophagitis: Secondary | ICD-10-CM | POA: Diagnosis not present

## 2023-11-08 DIAGNOSIS — I2609 Other pulmonary embolism with acute cor pulmonale: Secondary | ICD-10-CM | POA: Diagnosis not present

## 2023-11-08 DIAGNOSIS — I10 Essential (primary) hypertension: Secondary | ICD-10-CM | POA: Diagnosis not present

## 2023-11-08 DIAGNOSIS — I2692 Saddle embolus of pulmonary artery without acute cor pulmonale: Secondary | ICD-10-CM | POA: Diagnosis not present

## 2023-11-08 LAB — ECHOCARDIOGRAM COMPLETE
Area-P 1/2: 3.76 cm2
Height: 62 in
S' Lateral: 2.8 cm
Weight: 2716.07 [oz_av]

## 2023-11-08 LAB — BASIC METABOLIC PANEL WITH GFR
Anion gap: 7 (ref 5–15)
BUN: 24 mg/dL — ABNORMAL HIGH (ref 8–23)
CO2: 23 mmol/L (ref 22–32)
Calcium: 9.4 mg/dL (ref 8.9–10.3)
Chloride: 109 mmol/L (ref 98–111)
Creatinine, Ser: 1.3 mg/dL — ABNORMAL HIGH (ref 0.44–1.00)
GFR, Estimated: 41 mL/min — ABNORMAL LOW (ref >60)
Glucose, Bld: 99 mg/dL (ref 70–99)
Potassium: 4.1 mmol/L (ref 3.5–5.1)
Sodium: 139 mmol/L (ref 135–145)

## 2023-11-08 LAB — CBC
HCT: 31 % — ABNORMAL LOW (ref 36.0–46.0)
Hemoglobin: 9.9 g/dL — ABNORMAL LOW (ref 12.0–15.0)
MCH: 30.6 pg (ref 26.0–34.0)
MCHC: 31.9 g/dL (ref 30.0–36.0)
MCV: 95.7 fL (ref 80.0–100.0)
Platelets: 151 10*3/uL (ref 150–400)
RBC: 3.24 MIL/uL — ABNORMAL LOW (ref 3.87–5.11)
RDW: 28.1 % — ABNORMAL HIGH (ref 11.5–15.5)
WBC: 5.5 10*3/uL (ref 4.0–10.5)
nRBC: 0 % (ref 0.0–0.2)

## 2023-11-08 LAB — HEPARIN LEVEL (UNFRACTIONATED): Heparin Unfractionated: 0.7 [IU]/mL (ref 0.30–0.70)

## 2023-11-08 NOTE — Progress Notes (Signed)
 OT Cancellation Note  Patient Details Name: ALOISE SCHOEPFLIN MRN: [id number] DOB: 08/03/1940   Cancelled Treatment:    Reason Eval/Treat Not Completed: Medical issues which prohibited therapy Patient is pending doppler at this time for possible DVT with known saddle embolus. OT to continue to follow and check back as schedule will allow.  Wynette Heckler, MS Acute Rehabilitation Department Office# [phone]  11/08/2023, 12:15 PM

## 2023-11-08 NOTE — Progress Notes (Addendum)
 Adrienne Rogers   DOB:[birth year]   ZO#:[id number]      ASSESSMENT & PLAN:  Pulmonary embolism, saddle embolus - CT angio chest done 11/06/2023 shows saddle embolus. - On IV heparin which is infusing well.  Recommend continue for 3 to 4 days with transition to apixaban. - Pending bilateral lower extremity venous Dopplers, ordered.  Will follow results. - Monitor closely for bleeding, no bleeding noted at this time  Invasive Colon Adenocarcinoma (pT3,pN1b,cM0) - Diagnosed 07/2023.  No evidence of mets per CT chest done 08/02/2023. - Status post colectomy 08/03/2023 -Started adjuvant capecitabine  09/08/2023.  On cycle 3 capecitabine , started 10/20/2023. - Medical oncology/Dr. Scherrie Curt following who will make further evaluation and treatment recommendations.  Anemia, normocytic -Hemoglobin 9.9 - Baseline hemoglobin in the 10-11 range - Transfuse PRBC for Hgb <7 0.0-7.5.  No transfusional intervention warranted at this time - Continue to monitor CBC with differential  Difficulty ambulating - Patient ambulates with walker. - Continue supportive care  Hypertension Hyperlipidemia History of asthma - Continue to monitor blood sugar levels closely - On statin - On CPAP and inhalers as needed, also O2 therapy although patient currently does not have O2 in use. - Continue supportive care   Code Status Full  Subjective:  Patient seen awake alert and oriented x 3 laying in bed.  Adrienne Rogers denies pain.  Has been ambulating to bathroom with assist.  Adrienne Rogers reports her shortness of breath is much better when Adrienne Rogers is walking.  No other acute complaints or acute distress is noted.  Objective:  Vitals:   11/08/23 0540 11/08/23 0754  BP: (!) 152/77   Pulse: (!) 58   Resp: 19   Temp: 98.6 F (37 C)   SpO2: 100% 96%     Intake/Output Summary (Last 24 hours) at 11/08/2023 9811 Last data filed at 11/08/2023 0320 Gross per 24 hour  Intake 195.55 ml  Output --  Net 195.55 ml     PHYSICAL  EXAMINATION: ECOG PERFORMANCE STATUS: 2 - Symptomatic, <50% confined to bed  Vitals:   11/08/23 0540 11/08/23 0754  BP: (!) 152/77   Pulse: (!) 58   Resp: 19   Temp: 98.6 F (37 C)   SpO2: 100% 96%   Filed Weights   11/06/23 1337  Weight: 169 lb 12.1 oz (77 kg)    GENERAL: alert, no distress and comfortable SKIN: skin color, texture, turgor are normal, no rashes or significant lesions EYES: normal, conjunctiva are pink and non-injected, sclera clear OROPHARYNX: no exudate, no erythema and lips, buccal mucosa, and tongue normal  NECK: supple, thyroid normal size, non-tender, without nodularity LYMPH: no palpable lymphadenopathy in the cervical, axillary or inguinal LUNGS: clear to auscultation and percussion with normal breathing effort HEART: regular rate & rhythm and no murmurs and no lower extremity edema ABDOMEN: abdomen soft, non-tender and normal bowel sounds MUSCULOSKELETAL: + Ambulates with walker and assist PSYCH: alert & oriented x 3 with fluent speech NEURO: no focal motor/sensory deficits   All questions were answered. The patient knows to call the clinic with any problems, questions or concerns.   The total time spent in the appointment was 40 minutes encounter with patient including review of chart and various tests results, discussions about plan of care and coordination of care plan  Jacqualin Mate, NP 11/08/2023 9:03 AM    Labs Reviewed:  Lab Results  Component Value Date   WBC 5.5 11/08/2023   HGB 9.9 (L) 11/08/2023   HCT 31.0 (L) 11/08/2023  MCV 95.7 11/08/2023   PLT 151 11/08/2023   Recent Labs    08/31/23 1102 09/27/23 1334 10/19/23 1025 11/06/23 1404 11/07/23 0326 11/08/23 0536  NA 143 141 141 140 141 139  K 4.3 4.1 4.1 4.2 3.7 4.1  CL 109 105 107 108 111 109  CO2 26 27 25 24 23 23   GLUCOSE 113* 101* 146* 126* 115* 99  BUN 17 19 34* 32* 26* 24*  CREATININE 1.32* 1.28* 1.45* 1.56* 1.27* 1.30*  CALCIUM  10.0 9.8 9.4 9.6 9.5 9.4  GFRNONAA  41* 42* 36* 33* 42* 41*  PROT 7.2 7.0 6.6  --   --   --   ALBUMIN 4.7 4.4 4.4  --   --   --   AST 15 14* 13*  --   --   --   ALT 17 13 14   --   --   --   ALKPHOS 78 84 77  --   --   --   BILITOT 0.3 0.4 0.4  --   --   --     Studies Reviewed:  CT Angio Chest PE W and/or Wo Contrast Result Date: 11/06/2023 CLINICAL DATA:  Colon cancer staging. EXAM: CT ANGIOGRAPHY CHEST CT ABDOMEN AND PELVIS WITH CONTRAST TECHNIQUE: Multidetector CT imaging of the chest was performed using the standard protocol during bolus administration of intravenous contrast. Multiplanar CT image reconstructions and MIPs were obtained to evaluate the vascular anatomy. Multidetector CT imaging of the abdomen and pelvis was performed using the standard protocol during bolus administration of intravenous contrast. RADIATION DOSE REDUCTION: This exam was performed according to the departmental dose-optimization program which includes automated exposure control, adjustment of the mA and/or kV according to patient size and/or use of iterative reconstruction technique. CONTRAST:  80mL OMNIPAQUE  IOHEXOL  350 MG/ML SOLN COMPARISON:  Chest radiograph dated 11/06/2023. chest CT dated 08/02/2023 and CT abdomen pelvis dated 07/22/2023. FINDINGS: CTA CHEST FINDINGS Cardiovascular: There is borderline cardiomegaly. Small pericardial effusion measuring 6 mm in thickness. The thoracic aorta is unremarkable. There is a saddle embolus straddling the bifurcation of the pulmonary trunk and extending into the central pulmonary arteries and bilateral upper and lower lobar branches. No CT evidence of right heart straining. Mediastinum/Nodes: No hilar or mediastinal adenopathy. The esophagus and the thyroid gland are grossly unremarkable. No mediastinal fluid collection. Lungs/Pleura: Minimal bibasilar subpleural atelectasis. No focal consolidation, pleural effusion or pneumothorax. The central airways are patent. Musculoskeletal: No acute osseous pathology.  Degenerative changes of the spine. Review of the MIP images confirms the above findings. CT ABDOMEN and PELVIS FINDINGS No intra-abdominal free air or free fluid. Hepatobiliary: The liver is unremarkable. No biliary dilatation. Cholecystectomy. Pancreas: Unremarkable. No pancreatic ductal dilatation or surrounding inflammatory changes. Spleen: Normal in size without focal abnormality. Adrenals/Urinary Tract: The right adrenal gland is unremarkable. Nodular thickening of the lateral limb of the left adrenal gland measuring 9 mm in thickness. Mild bilateral renal parenchyma atrophy. There is no hydronephrosis on either side. There is symmetric enhancement and excretion of contrast by both kidneys. The visualized ureters and urinary bladder appear unremarkable. Stomach/Bowel: There is postsurgical changes of the bowel with anastomotic staple line in the right lower quadrant. There is no bowel obstruction or active inflammation. Appendectomy. Vascular/Lymphatic: Moderate aortoiliac atherosclerotic disease. The IVC is unremarkable. 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 The main portal vein is patent. The left portal vein branch is not visualized and  is likely occluded. The right branch of the portal vein is patent. No portal venous gas. There is no adenopathy. Reproductive: Hysterectomy.  No suspicious adnexal mass. Other: Midline vertical anterior abdominal wall incisional scar. Musculoskeletal: Degenerative changes of the spine. Grade 1 L4-L5 anterolisthesis. No acute osseous pathology. Review of the MIP images confirms the above findings. IMPRESSION: 1. Saddle embolus.  No CT evidence of right heart straining. 2. 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 3. Postsurgical changes of the bowel. No bowel obstruction. 4. No evidence of metastatic disease in the chest,  abdomen, or pelvis. 5.  Aortic Atherosclerosis (ICD10-I70.0). These results were called by telephone at the time of interpretation on 11/06/2023 at 5:55 p.m. to provider DAVID YAO , who verbally acknowledged these results. Electronically Signed   By: Angus Bark M.D.   On: 11/06/2023 18:01   CT ABDOMEN PELVIS W CONTRAST Result Date: 11/06/2023 CLINICAL DATA:  Colon cancer staging. EXAM: CT ANGIOGRAPHY CHEST CT ABDOMEN AND PELVIS WITH CONTRAST TECHNIQUE: Multidetector CT imaging of the chest was performed using the standard protocol during bolus administration of intravenous contrast. Multiplanar CT image reconstructions and MIPs were obtained to evaluate the vascular anatomy. Multidetector CT imaging of the abdomen and pelvis was performed using the standard protocol during bolus administration of intravenous contrast. RADIATION DOSE REDUCTION: This exam was performed according to the departmental dose-optimization program which includes automated exposure control, adjustment of the mA and/or kV according to patient size and/or use of iterative reconstruction technique. CONTRAST:  80mL OMNIPAQUE  IOHEXOL  350 MG/ML SOLN COMPARISON:  Chest radiograph dated 11/06/2023. chest CT dated 08/02/2023 and CT abdomen pelvis dated 07/22/2023. FINDINGS: CTA CHEST FINDINGS Cardiovascular: There is borderline cardiomegaly. Small pericardial effusion measuring 6 mm in thickness. The thoracic aorta is unremarkable. There is a saddle embolus straddling the bifurcation of the pulmonary trunk and extending into the central pulmonary arteries and bilateral upper and lower lobar branches. No CT evidence of right heart straining. Mediastinum/Nodes: No hilar or mediastinal adenopathy. The esophagus and the thyroid gland are grossly unremarkable. No mediastinal fluid collection. Lungs/Pleura: Minimal bibasilar subpleural atelectasis. No focal consolidation, pleural effusion or pneumothorax. The central airways are patent.  Musculoskeletal: No acute osseous pathology. Degenerative changes of the spine. Review of the MIP images confirms the above findings. CT ABDOMEN and PELVIS FINDINGS No intra-abdominal free air or free fluid. Hepatobiliary: The liver is unremarkable. No biliary dilatation. Cholecystectomy. Pancreas: Unremarkable. No pancreatic ductal dilatation or surrounding inflammatory changes. Spleen: Normal in size without focal abnormality. Adrenals/Urinary Tract: The right adrenal gland is unremarkable. Nodular thickening of the lateral limb of the left adrenal gland measuring 9 mm in thickness. Mild bilateral renal parenchyma atrophy. There is no hydronephrosis on either side. There is symmetric enhancement and excretion of contrast by both kidneys. The visualized ureters and urinary bladder appear unremarkable. Stomach/Bowel: There is postsurgical changes of the bowel with anastomotic staple line in the right lower quadrant. There is no bowel obstruction or active inflammation. Appendectomy. Vascular/Lymphatic: Moderate aortoiliac atherosclerotic disease. The IVC is unremarkable. 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 The main portal vein is patent. The left portal vein branch is not visualized and is likely occluded. The right branch of the portal vein is patent. No portal venous gas. There is no adenopathy. Reproductive: Hysterectomy.  No suspicious adnexal mass. Other: Midline vertical anterior  abdominal wall incisional scar. Musculoskeletal: Degenerative changes of the spine. Grade 1 L4-L5 anterolisthesis. No acute osseous pathology. Review of the MIP images confirms the above findings. IMPRESSION: 1. Saddle embolus.  No CT evidence of right heart straining. 2. 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 3. Postsurgical changes of the bowel. No bowel obstruction. 4. No  evidence of metastatic disease in the chest, abdomen, or pelvis. 5.  Aortic Atherosclerosis (ICD10-I70.0). These results were called by telephone at the time of interpretation on 11/06/2023 at 5:55 p.m. to provider DAVID YAO , who verbally acknowledged these results. Electronically Signed   By: Angus Bark M.D.   On: 11/06/2023 18:01   DG Chest 2 View Result Date: 11/06/2023 CLINICAL DATA:  Chest pain and shortness of breath.  Colon cancer. EXAM: CHEST - 2 VIEW COMPARISON:  Two-view chest x-ray 07/24/2017.  CT chest 08/02/2023 FINDINGS: Heart size is normal. Lungs are clear. No edema or effusion is present. No focal airspace disease present. Mild degenerative changes are noted in the thoracic spine. Cervical spine surgery noted. IMPRESSION: No acute cardiopulmonary disease. Electronically Signed   By: Audree Leas M.D.   On: 11/06/2023 14:43   DG ESOPHAGUS W DOUBLE CM (HD) Result Date: 10/18/2023 CLINICAL DATA:  Dysphagia.  Gastroesophageal reflux disease. EXAM: ESOPHAGUS/BARIUM SWALLOW/TABLET STUDY TECHNIQUE: Combined double and single contrast examination was performed using effervescent crystals, high-density barium, and thin liquid barium. FLUOROSCOPY: Radiation Exposure Index (as provided by the fluoroscopic device): 7.8 mGy Kerma COMPARISON:  Esophagram 10/30/2015 FINDINGS: Swallowing: Appears normal. No vestibular penetration or aspiration seen. Pharynx: Unremarkable. Esophagus: Normal appearance. Esophageal motility: Mild esophageal dysmotility. No significant tertiary contractions. Hiatal Hernia: The previously shown small sliding hiatal hernia was not readily apparent during today's examination. Gastroesophageal reflux: None visualized. Ingested 13 mm barium tablet: Passed normally. IMPRESSION: Mild esophageal dysmotility, otherwise unremarkable esophagram. Electronically Signed   By: Aundra Lee M.D.   On: 10/18/2023 14:39   Adrienne Rogers was interviewed and examined.  Adrienne Rogers completed  cycle 3 adjuvant capecitabine  beginning 10/20/2023.  Adrienne Rogers has a history of stage III colon cancer.  Adrienne Rogers was admitted with dyspnea.  Adrienne Rogers reports the dyspnea has been present for several weeks.  Adrienne Rogers was noted to have a saddle pulmonary embolism on a CT chest.  Adrienne Rogers was placed on heparin anticoagulation.  Adrienne Rogers reports no previous history of venous thrombosis.  I recommend continuing heparin anticoagulation for 2 to 3 days and then transition to apixaban.  Her risk factors for venous thrombosis include recent surgery and chemotherapy.  There was no CT evidence for progression of colon cancer, but this is possible.  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  Recommendations: Heparin anticoagulation for 3-4 days Transition to apixaban prior to discharge from the hospital Outpatient follow-up at the Cancer center with the plan to continue adjuvant capecitabine  chemotherapy

## 2023-11-08 NOTE — Progress Notes (Signed)
 Mobility Specialist - Progress Note   11/08/23 1323  Therapy Vitals  BP (!) 93/59  Patient Position (if appropriate) Standing  Mobility  Activity Ambulated with assistance in hallway  Level of Assistance Contact guard assist, steadying assist  Assistive Device Front wheel walker  Distance Ambulated (ft) 20 ft  Activity Response Tolerated fair  Mobility Referral Yes  Mobility visit 1 Mobility  Mobility Specialist Start Time (ACUTE ONLY) 1303  Mobility Specialist Stop Time (ACUTE ONLY) 1300  Mobility Specialist Time Calculation (min) (ACUTE ONLY) 1437 min   Pt received in bed and agreeable to mobility. Once in hallway pt c/o feeling dizzy. Assisted back to room. BP checked standing & sitting. No other complaints during session. RN made aware. Pt to bed after session with all needs met. Bed alarm on.      Standing post-ambulation: 93/58 BP Seated post-mobility: 103/58 BP  Chief Technology Officer

## 2023-11-08 NOTE — Progress Notes (Signed)
 PHARMACY - ANTICOAGULATION CONSULT NOTE  Pharmacy Consult for IV UFH Indication: pulmonary embolus  Allergies  Allergen Reactions   Codeine Itching, Rash and Other (See Comments)   Lisinopril Other (See Comments)    Tinnitus   Ezetimibe-Simvastatin  Other (See Comments)    Muscle Pain   Fosamax  [Alendronate Sodium] Other (See Comments)    Heartburn   Sulfa Antibiotics Hives, Itching, Rash and Other (See Comments)    ITCHING,WATERING EYES   Bupropion  Other (See Comments)    vertigo   Doxepin Swelling   Prazosin Other (See Comments)    Low blood pressure   Tetracyclines & Related Other (See Comments) and Nausea And Vomiting    Swelling eruption of the lips    Patient Measurements: Height: 5\' 2"  (157.5 cm) Weight: 77 kg (169 lb 12.1 oz) IBW/kg (Calculated) : 50.1 HEPARIN DW (KG): 66.9  Vital Signs: Temp: 98.6 F (37 C) (05/06 0540) BP: 152/77 (05/06 0540) Pulse Rate: 58 (05/06 0540)  Labs: Recent Labs    11/06/23 1404 11/06/23 1404 11/06/23 2139 11/07/23 0326 11/07/23 1101 11/07/23 2107 11/08/23 0536  HGB 11.0*  --   --  10.1*  --   --  9.9*  HCT 33.1*  --   --  30.1*  --   --  31.0*  PLT 133*  --   --  129*  --   --  151  HEPARINUNFRC  --    < >  --  0.62 0.76* 0.64 0.70  CREATININE 1.56*  --   --  1.27*  --   --  1.30*  TROPONINIHS 8  --  19*  --   --   --   --    < > = values in this interval not displayed.    Estimated Creatinine Clearance: 32.6 mL/min (A) (by C-G formula based on SCr of 1.3 mg/dL (H)).   Medical History: Past Medical History:  Diagnosis Date   Asthma    COPD (chronic obstructive pulmonary disease) (HCC)    Depression    Hyperlipidemia    Hypertension    PTSD (post-traumatic stress disorder)      Assessment: 83 year old female presented with shortness of breath, imaging with saddle embolus as well as nonocclusive thrombus in the SMV. Pharmacy consulted to start heparin infusion.   Baseline CBC: Hgb 11, Plt 133. Not on  anticoagulation prior to admission.  11/08/2023 -HL 0.7 - therapeutic on heparin 900 units/hr -CBC stable -No complications of therapy noted  Goal of Therapy:  Heparin level 0.3-0.7 units/ml Monitor platelets by anticoagulation protocol: Yes   Plan:  -Continue heparin infusion at 900 units/hr -Daily CBC and heparin level -Note plan to transition to oral anticoagulation after 24-48 hrs of heparin therapy  Lolita Rise, PharmD, BCPS Clinical Pharmacist 11/08/2023 7:13 AM

## 2023-11-08 NOTE — Progress Notes (Signed)
  Echocardiogram 2D Echocardiogram has been performed.  Fain Home RDCS 11/08/2023, 10:30 AM

## 2023-11-08 NOTE — Progress Notes (Signed)
 Progress Note   Patient: Adrienne Rogers ZOX:[id number] DOB: [birth year] DOA: 11/06/2023     2 DOS: the patient was seen and examined on 11/08/2023   Brief hospital course: Patient is a 83 year old with history of HTN, HLD, GERD and colon cancer who was on oral chemotherapeutics who presents with a 2-week history of weakness, shortness of breath, dizziness.  Patient has been on oral chemo and thought her symptoms might be only related to that but her shortness of breath became worse with just minimal exertion and she finally decided to come in and have it checked out. Patient has a history of COPD from remote smoking history and is on multiple inhalers at home. In the ED patient had a negative respiratory panel negative troponins and a CT that showed a saddle embolism.  CT of the abdomen also showed nonocclusive clot in the superior mesenteric vein. The patient was started on heparin drip and we were asked to admit.  Assessment and Plan: * Pulmonary embolism (HCC) Acute with another clot in the SMV. Heparin drip per pharmacy protocol. Monitor PT/ aPTT. Discussed with vascular surgery, no acute intervention needed. If hemodynamic unstable advised to call IR. Check echo, lower extremity doppler. Plan to continue heparin drip for next 2 days before changing to Eliquis.  Hyperlipidemia Continue Zocor .  Thrombocytopenia (HCC) Platelets are 151 better today. Continue to monitor on heparin drip. Watch for bleeding.  Stage 3b chronic kidney disease (HCC) Stable. Creatinine seems stable around 1.3 Hold nephrotoxic agents Monitor renal function.  Anemia of chronic disease. Hb stable. Watch H/H closely due to heparin drip. Discussed about risks of bleeding on heparin drip.  Hypersomnia with sleep apnea Continue CPAP at at bedtime  Gastroesophageal reflux disease Continue PPI daily.  Essential hypertension BP elevated. Continue Coreg , spironolactone   Depressive disorder Continue  Zoloft  and trazodone   COPD (chronic obstructive pulmonary disease) (HCC) Continue preventative inhalers Continue albuterol  for rescue  Colon cancer (HCC) On oral chemo.  Primary oncology consult appreciated.  Obesity Class 1. BMI 31.05- Diet, exercise and weight reduction advised.    Out of bed to chair. Incentive spirometry. Nursing supportive care. Fall, aspiration precautions. Diet:  Diet Orders (From admission, onward)     Start     Ordered   11/06/23 1946  Diet Heart Room service appropriate? Yes; Fluid consistency: Thin  Diet effective now       Question Answer Comment  Room service appropriate? Yes   Fluid consistency: Thin      11/06/23 1946           DVT prophylaxis:   Level of care: Telemetry   Code Status: Limited: Do not attempt resuscitation (DNR) -DNR-LIMITED -Do Not Intubate/DNI   Subjective: Patient is seen and examined today morning. She is lying comfortably. Did not get out of bed. Advised to work with PT.  Physical Exam: Vitals:   11/08/23 0540 11/08/23 0754 11/08/23 1307 11/08/23 1323  BP: (!) 152/77  138/66 (!) 93/59  Pulse: (!) 58  60   Resp: 19  16   Temp: 98.6 F (37 C)  97.9 F (36.6 C)   TempSrc:   Oral   SpO2: 100% 96% 99%   Weight:      Height:        General - Elderly obese African American female, no apparent distress HEENT - PERRLA, EOMI, atraumatic head, non tender sinuses. Lung - Clear, basal rales, no rhonchi, wheezes. Heart - S1, S2 heard, no murmurs, rubs, trace pedal edema.  Abdomen - Soft, non tender, bowel sounds good Neuro - Alert, awake and oriented x 3, non focal exam. Skin - Warm and dry.  Data Reviewed:      Latest Ref Rng & Units 11/08/2023    5:36 AM 11/07/2023    3:26 AM 11/06/2023    2:04 PM  CBC  WBC 4.0 - 10.5 K/uL 5.5  5.8  5.6   Hemoglobin 12.0 - 15.0 g/dL 9.9  16.1  09.6   Hematocrit 36.0 - 46.0 % 31.0  30.1  33.1   Platelets 150 - 400 K/uL 151  129  133       Latest Ref Rng & Units 11/08/2023     5:36 AM 11/07/2023    3:26 AM 11/06/2023    2:04 PM  BMP  Glucose 70 - 99 mg/dL 99  045  409   BUN 8 - 23 mg/dL 24  26  32   Creatinine 0.44 - 1.00 mg/dL 8.11  9.14  7.82   Sodium 135 - 145 mmol/L 139  141  140   Potassium 3.5 - 5.1 mmol/L 4.1  3.7  4.2   Chloride 98 - 111 mmol/L 109  111  108   CO2 22 - 32 mmol/L 23  23  24    Calcium  8.9 - 10.3 mg/dL 9.4  9.5  9.6    ECHOCARDIOGRAM COMPLETE Result Date: 11/08/2023    ECHOCARDIOGRAM REPORT   Patient Name:   Adrienne Rogers Date of Exam: 11/08/2023 Medical Rec #:  [id number]          Height:       62.0 in Accession #:    [id number]         Weight:       169.8 lb Date of Birth:  December 13, 1940         BSA:          1.783 m Patient Age:    82 years           BP:           152/77 mmHg Patient Gender: F                  HR:           63 bpm. Exam Location:  Inpatient Procedure: 2D Echo, Cardiac Doppler and Color Doppler (Both Spectral and Color            Flow Doppler were utilized during procedure). Indications:    Pulmonary Embolus I26.09  History:        Patient has no prior history of Echocardiogram examinations.                 COPD; Risk Factors:Dyslipidemia and Hypertension.  Sonographer:    Hersey Lorenzo Referring Phys: [id number] Aisha Hove IMPRESSIONS  1. Left ventricular ejection fraction, by estimation, is 60 to 65%. The left ventricle has normal function. The left ventricle has no regional wall motion abnormalities. There is mild left ventricular hypertrophy. Left ventricular diastolic parameters are consistent with Grade I diastolic dysfunction (impaired relaxation).  2. Right ventricular systolic function is normal. The right ventricular size is normal.  3. Left atrial size was moderately dilated.  4. The mitral valve is normal in structure. Mild mitral valve regurgitation. No evidence of mitral stenosis.  5. Tricuspid valve regurgitation is moderate.  6. The aortic valve is tricuspid. Aortic valve regurgitation is not visualized. No  aortic stenosis is present.  7. The inferior vena cava  is normal in size with greater than 50% respiratory variability, suggesting right atrial pressure of 3 mmHg. FINDINGS  Left Ventricle: Left ventricular ejection fraction, by estimation, is 60 to 65%. The left ventricle has normal function. The left ventricle has no regional wall motion abnormalities. The left ventricular internal cavity size was normal in size. There is  mild left ventricular hypertrophy. Left ventricular diastolic parameters are consistent with Grade I diastolic dysfunction (impaired relaxation). Right Ventricle: The right ventricular size is normal. No increase in right ventricular wall thickness. Right ventricular systolic function is normal. Left Atrium: Left atrial size was moderately dilated. Right Atrium: Right atrial size was normal in size. Pericardium: There is no evidence of pericardial effusion. Mitral Valve: The mitral valve is normal in structure. Mild mitral valve regurgitation. No evidence of mitral valve stenosis. Tricuspid Valve: The tricuspid valve is normal in structure. Tricuspid valve regurgitation is moderate . No evidence of tricuspid stenosis. Aortic Valve: The aortic valve is tricuspid. Aortic valve regurgitation is not visualized. No aortic stenosis is present. Pulmonic Valve: The pulmonic valve was normal in structure. Pulmonic valve regurgitation is mild to moderate. No evidence of pulmonic stenosis. Aorta: The aortic root is normal in size and structure. Venous: The inferior vena cava is normal in size with greater than 50% respiratory variability, suggesting right atrial pressure of 3 mmHg. IAS/Shunts: No atrial level shunt detected by color flow Doppler.  LEFT VENTRICLE PLAX 2D LVIDd:         4.80 cm   Diastology LVIDs:         2.80 cm   LV e' medial:    5.44 cm/s LV PW:         1.30 cm   LV E/e' medial:  12.4 LV IVS:        1.30 cm   LV e' lateral:   6.64 cm/s LVOT diam:     1.90 cm   LV E/e' lateral: 10.2 LV SV:          72 LV SV Index:   40 LVOT Area:     2.84 cm  RIGHT VENTRICLE            IVC RV S prime:     8.16 cm/s  IVC diam: 1.80 cm TAPSE (M-mode): 2.0 cm LEFT ATRIUM             Index        RIGHT ATRIUM           Index LA diam:        4.00 cm 2.24 cm/m   RA Area:     14.00 cm LA Vol (A2C):   56.4 ml 31.63 ml/m  RA Volume:   32.70 ml  18.34 ml/m LA Vol (A4C):   77.7 ml 43.58 ml/m LA Biplane Vol: 70.4 ml 39.48 ml/m  AORTIC VALVE LVOT Vmax:   113.00 cm/s LVOT Vmean:  74.500 cm/s LVOT VTI:    0.253 m  AORTA Ao Root diam: 3.00 cm Ao Asc diam:  3.00 cm MITRAL VALVE               TRICUSPID VALVE MV Area (PHT): 3.76 cm    TR Peak grad:   39.7 mmHg MV Decel Time: 202 msec    TR Vmax:        315.00 cm/s MV E velocity: 67.70 cm/s MV A velocity: 87.80 cm/s  SHUNTS MV E/A ratio:  0.77        Systemic VTI:  0.25 m                            Systemic Diam: 1.90 cm Dorothye Gathers MD Electronically signed by Dorothye Gathers MD Signature Date/Time: 11/08/2023/1:19:52 PM    Final    CT Angio Chest PE W and/or Wo Contrast Result Date: 11/06/2023 CLINICAL DATA:  Colon cancer staging. EXAM: CT ANGIOGRAPHY CHEST CT ABDOMEN AND PELVIS WITH CONTRAST TECHNIQUE: Multidetector CT imaging of the chest was performed using the standard protocol during bolus administration of intravenous contrast. Multiplanar CT image reconstructions and MIPs were obtained to evaluate the vascular anatomy. Multidetector CT imaging of the abdomen and pelvis was performed using the standard protocol during bolus administration of intravenous contrast. RADIATION DOSE REDUCTION: This exam was performed according to the departmental dose-optimization program which includes automated exposure control, adjustment of the mA and/or kV according to patient size and/or use of iterative reconstruction technique. CONTRAST:  80mL OMNIPAQUE  IOHEXOL  350 MG/ML SOLN COMPARISON:  Chest radiograph dated 11/06/2023. chest CT dated 08/02/2023 and CT abdomen pelvis dated 07/22/2023.  FINDINGS: CTA CHEST FINDINGS Cardiovascular: There is borderline cardiomegaly. Small pericardial effusion measuring 6 mm in thickness. The thoracic aorta is unremarkable. There is a saddle embolus straddling the bifurcation of the pulmonary trunk and extending into the central pulmonary arteries and bilateral upper and lower lobar branches. No CT evidence of right heart straining. Mediastinum/Nodes: No hilar or mediastinal adenopathy. The esophagus and the thyroid gland are grossly unremarkable. No mediastinal fluid collection. Lungs/Pleura: Minimal bibasilar subpleural atelectasis. No focal consolidation, pleural effusion or pneumothorax. The central airways are patent. Musculoskeletal: No acute osseous pathology. Degenerative changes of the spine. Review of the MIP images confirms the above findings. CT ABDOMEN and PELVIS FINDINGS No intra-abdominal free air or free fluid. Hepatobiliary: The liver is unremarkable. No biliary dilatation. Cholecystectomy. Pancreas: Unremarkable. No pancreatic ductal dilatation or surrounding inflammatory changes. Spleen: Normal in size without focal abnormality. Adrenals/Urinary Tract: The right adrenal gland is unremarkable. Nodular thickening of the lateral limb of the left adrenal gland measuring 9 mm in thickness. Mild bilateral renal parenchyma atrophy. There is no hydronephrosis on either side. There is symmetric enhancement and excretion of contrast by both kidneys. The visualized ureters and urinary bladder appear unremarkable. Stomach/Bowel: There is postsurgical changes of the bowel with anastomotic staple line in the right lower quadrant. There is no bowel obstruction or active inflammation. Appendectomy. Vascular/Lymphatic: Moderate aortoiliac atherosclerotic disease. The IVC is unremarkable. 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 The main portal vein is patent. The left portal vein branch is not visualized  and is likely occluded. The right branch of the portal vein is patent. No portal venous gas. There is no adenopathy. Reproductive: Hysterectomy.  No suspicious adnexal mass. Other: Midline vertical anterior abdominal wall incisional scar. Musculoskeletal: Degenerative changes of the spine. Grade 1 L4-L5 anterolisthesis. No acute osseous pathology. Review of the MIP images confirms the above findings. IMPRESSION: 1. Saddle embolus.  No CT evidence of right heart straining. 2. 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 3. Postsurgical changes of the bowel. No bowel obstruction. 4. No evidence of metastatic disease in the chest, abdomen, or pelvis. 5.  Aortic Atherosclerosis (ICD10-I70.0). These results were called by telephone at the time of interpretation on 11/06/2023 at 5:55 p.m. to  provider DAVID YAO , who verbally acknowledged these results. Electronically Signed   By: Angus Bark M.D.   On: 11/06/2023 18:01   CT ABDOMEN PELVIS W CONTRAST Result Date: 11/06/2023 CLINICAL DATA:  Colon cancer staging. EXAM: CT ANGIOGRAPHY CHEST CT ABDOMEN AND PELVIS WITH CONTRAST TECHNIQUE: Multidetector CT imaging of the chest was performed using the standard protocol during bolus administration of intravenous contrast. Multiplanar CT image reconstructions and MIPs were obtained to evaluate the vascular anatomy. Multidetector CT imaging of the abdomen and pelvis was performed using the standard protocol during bolus administration of intravenous contrast. RADIATION DOSE REDUCTION: This exam was performed according to the departmental dose-optimization program which includes automated exposure control, adjustment of the mA and/or kV according to patient size and/or use of iterative reconstruction technique. CONTRAST:  80mL OMNIPAQUE  IOHEXOL  350 MG/ML SOLN COMPARISON:  Chest radiograph dated 11/06/2023. chest CT dated 08/02/2023 and CT  abdomen pelvis dated 07/22/2023. FINDINGS: CTA CHEST FINDINGS Cardiovascular: There is borderline cardiomegaly. Small pericardial effusion measuring 6 mm in thickness. The thoracic aorta is unremarkable. There is a saddle embolus straddling the bifurcation of the pulmonary trunk and extending into the central pulmonary arteries and bilateral upper and lower lobar branches. No CT evidence of right heart straining. Mediastinum/Nodes: No hilar or mediastinal adenopathy. The esophagus and the thyroid gland are grossly unremarkable. No mediastinal fluid collection. Lungs/Pleura: Minimal bibasilar subpleural atelectasis. No focal consolidation, pleural effusion or pneumothorax. The central airways are patent. Musculoskeletal: No acute osseous pathology. Degenerative changes of the spine. Review of the MIP images confirms the above findings. CT ABDOMEN and PELVIS FINDINGS No intra-abdominal free air or free fluid. Hepatobiliary: The liver is unremarkable. No biliary dilatation. Cholecystectomy. Pancreas: Unremarkable. No pancreatic ductal dilatation or surrounding inflammatory changes. Spleen: Normal in size without focal abnormality. Adrenals/Urinary Tract: The right adrenal gland is unremarkable. Nodular thickening of the lateral limb of the left adrenal gland measuring 9 mm in thickness. Mild bilateral renal parenchyma atrophy. There is no hydronephrosis on either side. There is symmetric enhancement and excretion of contrast by both kidneys. The visualized ureters and urinary bladder appear unremarkable. Stomach/Bowel: There is postsurgical changes of the bowel with anastomotic staple line in the right lower quadrant. There is no bowel obstruction or active inflammation. Appendectomy. Vascular/Lymphatic: Moderate aortoiliac atherosclerotic disease. The IVC is unremarkable. 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 The main portal vein is patent. The left  portal vein branch is not visualized and is likely occluded. The right branch of the portal vein is patent. No portal venous gas. There is no adenopathy. Reproductive: Hysterectomy.  No suspicious adnexal mass. Other: Midline vertical anterior abdominal wall incisional scar. Musculoskeletal: Degenerative changes of the spine. Grade 1 L4-L5 anterolisthesis. No acute osseous pathology. Review of the MIP images confirms the above findings. IMPRESSION: 1. Saddle embolus.  No CT evidence of right heart straining. 2. 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 3. Postsurgical changes of the bowel. No bowel obstruction. 4. No evidence of metastatic disease in the chest, abdomen, or pelvis. 5.  Aortic Atherosclerosis (ICD10-I70.0). These results were called by telephone at the time of interpretation on 11/06/2023 at 5:55 p.m. to provider DAVID YAO , who verbally acknowledged these results. Electronically Signed   By: Angus Bark M.D.   On: 11/06/2023 18:01    Family Communication: Discussed with patient,  she understand and agree. All questions answered.  Disposition: Status is: Inpatient Remains inpatient appropriate because: heparin drip, PT eval.  Planned Discharge Destination: Home with Home Health     MDM level 3 - Patient has saddle embolus, SMV clot need heparin drip, close hemodynamic, neurologic monitoring. She is on high risk medication and is at risk for sudden clinical deterioration.  Author: Aisha Hove, MD 11/08/2023 4:08 PM Secure chat 7am to 7pm For on call review [email or web address].

## 2023-11-08 NOTE — Plan of Care (Signed)

## 2023-11-08 NOTE — Plan of Care (Signed)

## 2023-11-09 ENCOUNTER — Inpatient Hospital Stay (HOSPITAL_COMMUNITY)

## 2023-11-09 ENCOUNTER — Other Ambulatory Visit: Payer: Self-pay | Admitting: *Deleted

## 2023-11-09 ENCOUNTER — Encounter (HOSPITAL_COMMUNITY)

## 2023-11-09 ENCOUNTER — Inpatient Hospital Stay: Attending: Oncology | Admitting: Oncology

## 2023-11-09 ENCOUNTER — Inpatient Hospital Stay

## 2023-11-09 DIAGNOSIS — I82512 Chronic embolism and thrombosis of left femoral vein: Secondary | ICD-10-CM | POA: Insufficient documentation

## 2023-11-09 DIAGNOSIS — D649 Anemia, unspecified: Secondary | ICD-10-CM

## 2023-11-09 DIAGNOSIS — E669 Obesity, unspecified: Secondary | ICD-10-CM | POA: Diagnosis not present

## 2023-11-09 DIAGNOSIS — C184 Malignant neoplasm of transverse colon: Secondary | ICD-10-CM | POA: Insufficient documentation

## 2023-11-09 DIAGNOSIS — I82412 Acute embolism and thrombosis of left femoral vein: Secondary | ICD-10-CM | POA: Insufficient documentation

## 2023-11-09 DIAGNOSIS — Z860100 Personal history of colon polyps, unspecified: Secondary | ICD-10-CM | POA: Insufficient documentation

## 2023-11-09 DIAGNOSIS — Z800 Family history of malignant neoplasm of digestive organs: Secondary | ICD-10-CM | POA: Insufficient documentation

## 2023-11-09 DIAGNOSIS — I2692 Saddle embolus of pulmonary artery without acute cor pulmonale: Secondary | ICD-10-CM | POA: Insufficient documentation

## 2023-11-09 DIAGNOSIS — Z86711 Personal history of pulmonary embolism: Secondary | ICD-10-CM | POA: Diagnosis not present

## 2023-11-09 LAB — BASIC METABOLIC PANEL WITH GFR
Anion gap: 8 (ref 5–15)
BUN: 22 mg/dL (ref 8–23)
CO2: 23 mmol/L (ref 22–32)
Calcium: 9.6 mg/dL (ref 8.9–10.3)
Chloride: 106 mmol/L (ref 98–111)
Creatinine, Ser: 1.26 mg/dL — ABNORMAL HIGH (ref 0.44–1.00)
GFR, Estimated: 43 mL/min — ABNORMAL LOW (ref >60)
Glucose, Bld: 97 mg/dL (ref 70–99)
Potassium: 3.9 mmol/L (ref 3.5–5.1)
Sodium: 137 mmol/L (ref 135–145)

## 2023-11-09 LAB — HEPATIC FUNCTION PANEL
ALT: 13 U/L (ref 0–44)
AST: 14 U/L — ABNORMAL LOW (ref 15–41)
Albumin: 3.7 g/dL (ref 3.5–5.0)
Alkaline Phosphatase: 76 U/L (ref 38–126)
Bilirubin, Direct: <0.1 mg/dL (ref 0.0–0.2)
Total Bilirubin: 0.8 mg/dL (ref 0.0–1.2)
Total Protein: 6.5 g/dL (ref 6.5–8.1)

## 2023-11-09 LAB — HEPARIN LEVEL (UNFRACTIONATED)
Heparin Unfractionated: 0.79 [IU]/mL — ABNORMAL HIGH (ref 0.30–0.70)
Heparin Unfractionated: 0.4 [IU]/mL (ref 0.30–0.70)

## 2023-11-09 LAB — CBC
HCT: 33.8 % — ABNORMAL LOW (ref 36.0–46.0)
Hemoglobin: 10.9 g/dL — ABNORMAL LOW (ref 12.0–15.0)
MCH: 30.5 pg (ref 26.0–34.0)
MCHC: 32.2 g/dL (ref 30.0–36.0)
MCV: 94.7 fL (ref 80.0–100.0)
Platelets: 163 10*3/uL (ref 150–400)
RBC: 3.57 MIL/uL — ABNORMAL LOW (ref 3.87–5.11)
RDW: 27.6 % — ABNORMAL HIGH (ref 11.5–15.5)
WBC: 5.3 10*3/uL (ref 4.0–10.5)
nRBC: 0 % (ref 0.0–0.2)

## 2023-11-09 MED ORDER — BISACODYL 10 MG RE SUPP
10.0000 mg | Freq: Once | RECTAL | Status: AC
  Administered 2023-11-09: 10 mg via RECTAL
  Filled 2023-11-09: qty 1

## 2023-11-09 NOTE — Progress Notes (Signed)
 Assumed care of patient from off going nurse. Agree with previous assessment. Patient assisted to restroom. Call light within reach. Bed alarmed activated.

## 2023-11-09 NOTE — Progress Notes (Addendum)
 Progress Note   Patient: Adrienne Rogers:[id number] DOB: [birth year] DOA: 11/06/2023     3 DOS: the patient was seen and examined on 11/09/2023   Brief hospital course: Patient is a 83 year old with history of HTN, HLD, GERD and colon cancer who was on oral chemotherapeutics who presents with a 2-week history of weakness, shortness of breath, dizziness.  Patient has been on oral chemo and thought her symptoms might be only related to that but her shortness of breath became worse with just minimal exertion and she finally decided to come in and have it checked out. Patient has a history of COPD from remote smoking history and is on multiple inhalers at home. In the ED patient had a negative respiratory panel negative troponins and a CT that showed a saddle embolism.  CT of the abdomen also showed nonocclusive clot in the superior mesenteric vein. The patient was started on heparin drip and we were asked to admit.  Assessment and Plan: * Acute saddle pulmonary embolism (HCC) SMV, left portal vein thrombosis. Heparin drip per pharmacy protocol for another day. Monitor PT/ aPTT. Discussed with vascular surgery, no acute intervention needed. If hemodynamic unstable advised to call IR. Echo with EF 65%, grade 1 diastolic dysfunction. Lower extremity doppler pending. Plan to change to Eliquis tomorrow before discharge.  Hyperlipidemia Continue Zocor .  Thrombocytopenia (HCC) Platelets improved. Continue to monitor on heparin drip.  Stage 3b chronic kidney disease (HCC) Stable. Creatinine seems stable around 1.3 Hold nephrotoxic agents. Monitor renal function.  Anemia of chronic disease. Hb stable. Watch H/H closely due to heparin drip. Discussed about risks of bleeding on heparin drip.  Hypersomnia with sleep apnea Continue CPAP at at bedtime  Gastroesophageal reflux disease Continue PPI daily.  Essential hypertension BP elevated. Continue Coreg , spironolactone   Depressive  disorder Continue Zoloft  and trazodone   COPD (chronic obstructive pulmonary disease) (HCC) Continue preventative inhalers Continue albuterol  for rescue  Invasive Colon cancer (HCC) On cycle 3 of chemo since last month.  Dr. Scherrie Curt follow up appreciated.  Obesity Class 1. BMI 31.05- Diet, exercise and weight reduction advised.   PT/ OT eval for discharge needs. Out of bed to chair. Incentive spirometry. Nursing supportive care. Fall, aspiration precautions. Diet:  Diet Orders (From admission, onward)     Start     Ordered   11/06/23 1946  Diet Heart Room service appropriate? Yes; Fluid consistency: Thin  Diet effective now       Question Answer Comment  Room service appropriate? Yes   Fluid consistency: Thin      11/06/23 1946           DVT prophylaxis: heparin drip  Level of care: Telemetry   Code Status: Limited: Do not attempt resuscitation (DNR) -DNR-LIMITED -Do Not Intubate/DNI   Subjective: Patient is seen and examined today morning. She is lying in bed. Denies chest pain, shortness of breath. Advised out of bed.  Physical Exam: Vitals:   11/08/23 2108 11/08/23 2247 11/09/23 0100 11/09/23 0606  BP: (!) 175/76 (!) 167/81  (!) 160/85  Pulse: (!) 55 (!) 56  (!) 57  Resp: 18  19 18   Temp: 98.3 F (36.8 C)   98.6 F (37 C)  TempSrc:    Oral  SpO2: 99%   99%  Weight:      Height:        General - Elderly obese African American female, no apparent distress HEENT - PERRLA, EOMI, atraumatic head, non tender sinuses. Lung - Clear, basal  rales, no rhonchi, wheezes. Heart - S1, S2 heard, no murmurs, rubs, trace pedal edema. Abdomen - Soft, non tender, bowel sounds good Neuro - Alert, awake and oriented x 3, non focal exam. Skin - Warm and dry.  Data Reviewed:      Latest Ref Rng & Units 11/09/2023    5:35 AM 11/08/2023    5:36 AM 11/07/2023    3:26 AM  CBC  WBC 4.0 - 10.5 K/uL 5.3  5.5  5.8   Hemoglobin 12.0 - 15.0 g/dL 21.3  9.9  08.6   Hematocrit  36.0 - 46.0 % 33.8  31.0  30.1   Platelets 150 - 400 K/uL 163  151  129       Latest Ref Rng & Units 11/09/2023    5:35 AM 11/08/2023    5:36 AM 11/07/2023    3:26 AM  BMP  Glucose 70 - 99 mg/dL 97  99  578   BUN 8 - 23 mg/dL 22  24  26    Creatinine 0.44 - 1.00 mg/dL 4.69  6.29  5.28   Sodium 135 - 145 mmol/L 137  139  141   Potassium 3.5 - 5.1 mmol/L 3.9  4.1  3.7   Chloride 98 - 111 mmol/L 106  109  111   CO2 22 - 32 mmol/L 23  23  23    Calcium  8.9 - 10.3 mg/dL 9.6  9.4  9.5    ECHOCARDIOGRAM COMPLETE Result Date: 11/08/2023    ECHOCARDIOGRAM REPORT   Patient Name:   Adrienne Rogers Date of Exam: 11/08/2023 Medical Rec #:  [id number]          Height:       62.0 in Accession #:    [id number]         Weight:       169.8 lb Date of Birth:  1941-02-15         BSA:          1.783 m Patient Age:    82 years           BP:           152/77 mmHg Patient Gender: F                  HR:           63 bpm. Exam Location:  Inpatient Procedure: 2D Echo, Cardiac Doppler and Color Doppler (Both Spectral and Color            Flow Doppler were utilized during procedure). Indications:    Pulmonary Embolus I26.09  History:        Patient has no prior history of Echocardiogram examinations.                 COPD; Risk Factors:Dyslipidemia and Hypertension.  Sonographer:    Hersey Lorenzo Referring Phys: [id number] Aisha Hove IMPRESSIONS  1. Left ventricular ejection fraction, by estimation, is 60 to 65%. The left ventricle has normal function. The left ventricle has no regional wall motion abnormalities. There is mild left ventricular hypertrophy. Left ventricular diastolic parameters are consistent with Grade I diastolic dysfunction (impaired relaxation).  2. Right ventricular systolic function is normal. The right ventricular size is normal.  3. Left atrial size was moderately dilated.  4. The mitral valve is normal in structure. Mild mitral valve regurgitation. No evidence of mitral stenosis.  5. Tricuspid valve  regurgitation is moderate.  6. The aortic valve is tricuspid. Aortic valve  regurgitation is not visualized. No aortic stenosis is present.  7. The inferior vena cava is normal in size with greater than 50% respiratory variability, suggesting right atrial pressure of 3 mmHg. FINDINGS  Left Ventricle: Left ventricular ejection fraction, by estimation, is 60 to 65%. The left ventricle has normal function. The left ventricle has no regional wall motion abnormalities. The left ventricular internal cavity size was normal in size. There is  mild left ventricular hypertrophy. Left ventricular diastolic parameters are consistent with Grade I diastolic dysfunction (impaired relaxation). Right Ventricle: The right ventricular size is normal. No increase in right ventricular wall thickness. Right ventricular systolic function is normal. Left Atrium: Left atrial size was moderately dilated. Right Atrium: Right atrial size was normal in size. Pericardium: There is no evidence of pericardial effusion. Mitral Valve: The mitral valve is normal in structure. Mild mitral valve regurgitation. No evidence of mitral valve stenosis. Tricuspid Valve: The tricuspid valve is normal in structure. Tricuspid valve regurgitation is moderate . No evidence of tricuspid stenosis. Aortic Valve: The aortic valve is tricuspid. Aortic valve regurgitation is not visualized. No aortic stenosis is present. Pulmonic Valve: The pulmonic valve was normal in structure. Pulmonic valve regurgitation is mild to moderate. No evidence of pulmonic stenosis. Aorta: The aortic root is normal in size and structure. Venous: The inferior vena cava is normal in size with greater than 50% respiratory variability, suggesting right atrial pressure of 3 mmHg. IAS/Shunts: No atrial level shunt detected by color flow Doppler.  LEFT VENTRICLE PLAX 2D LVIDd:         4.80 cm   Diastology LVIDs:         2.80 cm   LV e' medial:    5.44 cm/s LV PW:         1.30 cm   LV E/e' medial:   12.4 LV IVS:        1.30 cm   LV e' lateral:   6.64 cm/s LVOT diam:     1.90 cm   LV E/e' lateral: 10.2 LV SV:         72 LV SV Index:   40 LVOT Area:     2.84 cm  RIGHT VENTRICLE            IVC RV S prime:     8.16 cm/s  IVC diam: 1.80 cm TAPSE (M-mode): 2.0 cm LEFT ATRIUM             Index        RIGHT ATRIUM           Index LA diam:        4.00 cm 2.24 cm/m   RA Area:     14.00 cm LA Vol (A2C):   56.4 ml 31.63 ml/m  RA Volume:   32.70 ml  18.34 ml/m LA Vol (A4C):   77.7 ml 43.58 ml/m LA Biplane Vol: 70.4 ml 39.48 ml/m  AORTIC VALVE LVOT Vmax:   113.00 cm/s LVOT Vmean:  74.500 cm/s LVOT VTI:    0.253 m  AORTA Ao Root diam: 3.00 cm Ao Asc diam:  3.00 cm MITRAL VALVE               TRICUSPID VALVE MV Area (PHT): 3.76 cm    TR Peak grad:   39.7 mmHg MV Decel Time: 202 msec    TR Vmax:        315.00 cm/s MV E velocity: 67.70 cm/s MV A velocity: 87.80 cm/s  SHUNTS  MV E/A ratio:  0.77        Systemic VTI:  0.25 m                            Systemic Diam: 1.90 cm Dorothye Gathers MD Electronically signed by Dorothye Gathers MD Signature Date/Time: 11/08/2023/1:19:52 PM    Final     Family Communication: Discussed with patient, she understand and agree. All questions answered.  Disposition: Status is: Inpatient Remains inpatient appropriate because: heparin drip, PT eval.  Planned Discharge Destination: Home with Home Health     Time spent 40 min.  Author: Aisha Hove, MD 11/09/2023 10:46 AM Secure chat 7am to 7pm For on call review [email or web address].

## 2023-11-09 NOTE — Progress Notes (Signed)
 PHARMACY - ANTICOAGULATION CONSULT NOTE  Pharmacy Consult for IV UFH Indication: pulmonary embolus  Allergies  Allergen Reactions   Codeine Itching, Rash and Other (See Comments)   Lisinopril Other (See Comments)    Tinnitus   Ezetimibe-Simvastatin  Other (See Comments)    Muscle Pain   Fosamax  [Alendronate Sodium] Other (See Comments)    Heartburn   Sulfa Antibiotics Hives, Itching, Rash and Other (See Comments)    ITCHING,WATERING EYES   Bupropion  Other (See Comments)    vertigo   Doxepin Swelling   Prazosin Other (See Comments)    Low blood pressure   Tetracyclines & Related Other (See Comments) and Nausea And Vomiting    Swelling eruption of the lips   Patient Measurements: Height: 5\' 2"  (157.5 cm) Weight: 77 kg (169 lb 12.1 oz) IBW/kg (Calculated) : 50.1 HEPARIN DW (KG): 66.9  Vital Signs: Temp: 97.9 F (36.6 C) (05/07 1223) Temp Source: Oral (05/07 1223) BP: 124/68 (05/07 1223) Pulse Rate: 60 (05/07 1223)  Labs: Recent Labs     0000 11/06/23 2139 11/07/23 0326 11/07/23 1101 11/08/23 0536 11/09/23 0535 11/09/23 1436  HGB   < >  --  10.1*  --  9.9* 10.9*  --   HCT  --   --  30.1*  --  31.0* 33.8*  --   PLT  --   --  129*  --  151 163  --   HEPARINUNFRC  --   --  0.62   < > 0.70 0.79* 0.40  CREATININE  --   --  1.27*  --  1.30* 1.26*  --   TROPONINIHS  --  19*  --   --   --   --   --    < > = values in this interval not displayed.    Estimated Creatinine Clearance: 33.7 mL/min (A) (by C-G formula based on SCr of 1.26 mg/dL (H)).   Medical History: Past Medical History:  Diagnosis Date   Asthma    COPD (chronic obstructive pulmonary disease) (HCC)    Depression    Hyperlipidemia    Hypertension    PTSD (post-traumatic stress disorder)     Assessment: 83 year old female presented with shortness of breath, imaging with saddle embolus as well as nonocclusive thrombus in the SMV. Pharmacy consulted to start heparin infusion.   Baseline CBC: Hgb  11, Plt 133. Not on anticoagulation prior to admission.  Today, 11/09/23: -HL 0.40 - therapeutic on heparin 750 units/hr -CBC, Scr stable -No bleeding or infusion complications of therapy reported by RN  Goal of Therapy:  Heparin level 0.3-0.7 units/ml Monitor platelets by anticoagulation protocol: Yes   Plan:  -Continue heparin infusion at 750 units/hr -Daily CBC and heparin level -Possibly transition to Eliquis therapy tomorrow, 5/8   Roselee Cong, PharmD Clinical Pharmacist  5/7/20253:24 PM

## 2023-11-09 NOTE — Progress Notes (Signed)
 Lower extremity venous duplex completed. Please see CV Procedures for preliminary results.  Initial findings reported to [employer], Charity fundraiser.  Estanislao Heimlich, RVT 11/09/23 2:27 PM

## 2023-11-09 NOTE — Progress Notes (Addendum)
 Adrienne Rogers   DOB:01-Dec-1940   ZO#:[id number]      ASSESSMENT & PLAN:  Pulmonary embolism, saddle embolus - CT angio chest done 11/06/2023 shows saddle embolus. - Remains on IV heparin which is infusing well.  May transition to Apixaban tomorrow.   - Pending bilateral lower extremity venous Dopplers, ordered.  Will follow results. - Monitor closely for bleeding, no bleeding noted at this time   Invasive Colon Adenocarcinoma (pT3,pN1b,cM0) - Diagnosed 07/2023.  No evidence of mets per CT chest done 08/02/2023. - Status post colectomy 08/03/2023 -Started adjuvant capecitabine  09/08/2023.  On cycle 3 capecitabine , started 10/20/2023. - Medical oncology/Dr. Scherrie Curt following closely.  Patient to follow up as outpatient with the plan to continue adjuvant capecitabine  chemotherapy.   Anemia, normocytic -Hemoglobin stable and improving 10.9 - Baseline hemoglobin in the 10-11 range - Transfuse PRBC for Hgb <7 0.0-7.5.  No transfusional intervention warranted at this time - Continue to monitor CBC with differential   Difficulty ambulating - Patient ambulates with walker. - Continue supportive care   Hypertension Hyperlipidemia History of asthma - Continue to monitor blood sugar levels closely - On statin - On CPAP and inhalers as needed, also O2 therapy although patient currently does not have O2 in use. 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  Reports that she feels well and has no complaints.  IV heparin is infusing well.  Patient is aware she will be transitioned to Eliquis tomorrow.  No shortness of breath or other acute complaints.  Objective:  Vitals:   11/09/23 0100 11/09/23 0606  BP:  (!) 160/85  Pulse:  (!) 57  Resp: 19 18  Temp:  98.6 F (37 C)  SpO2:  99%     Intake/Output Summary (Last 24 hours)  at 11/09/2023 1022 Last data filed at 11/09/2023 0851 Gross per 24 hour  Intake 596.6 ml  Output --  Net 596.6 ml     PHYSICAL EXAMINATION: ECOG PERFORMANCE STATUS: 2 - Symptomatic, <50% confined to bed  Vitals:   11/09/23 0100 11/09/23 0606  BP:  (!) 160/85  Pulse:  (!) 57  Resp: 19 18  Temp:  98.6 F (37 C)  SpO2:  99%   Filed Weights   11/06/23 1337  Weight: 169 lb 12.1 oz (77 kg)    GENERAL: alert, no distress and comfortable SKIN: skin color, texture, turgor are normal, no rashes or significant lesions +darkened area over her nose EYES: normal, conjunctiva are pink and non-injected, sclera clear OROPHARYNX: no exudate, no erythema and lips, buccal mucosa, and tongue normal  NECK: supple, thyroid normal size, non-tender, without nodularity LYMPH: no palpable lymphadenopathy in the cervical, axillary or inguinal LUNGS: clear to auscultation and percussion with normal breathing effort HEART: regular rate & rhythm and no murmurs and no lower extremity edema ABDOMEN: abdomen soft, non-tender and normal bowel sounds MUSCULOSKELETAL: + Ambulates with assist PSYCH: alert & oriented x 3 with fluent speech NEURO: no focal motor/sensory deficits   All questions were answered. The patient knows to call the clinic with any problems, questions or concerns.   The total time spent in the appointment was 40 minutes encounter with patient including review of chart and various tests results, discussions about plan of care and coordination of care plan  Jacqualin Mate, NP 11/09/2023 10:22 AM    Labs  Reviewed:  Lab Results  Component Value Date   WBC 5.3 11/09/2023   HGB 10.9 (L) 11/09/2023   HCT 33.8 (L) 11/09/2023   MCV 94.7 11/09/2023   PLT 163 11/09/2023   Recent Labs    08/31/23 1102 09/27/23 1334 10/19/23 1025 11/06/23 1404 11/07/23 0326 11/08/23 0536 11/09/23 0535  NA 143 141 141   < > 141 139 137  K 4.3 4.1 4.1   < > 3.7 4.1 3.9  CL 109 105 107   < > 111 109 106   CO2 26 27 25    < > 23 23 23   GLUCOSE 113* 101* 146*   < > 115* 99 97  BUN 17 19 34*   < > 26* 24* 22  CREATININE 1.32* 1.28* 1.45*   < > 1.27* 1.30* 1.26*  CALCIUM  10.0 9.8 9.4   < > 9.5 9.4 9.6  GFRNONAA 41* 42* 36*   < > 42* 41* 43*  PROT 7.2 7.0 6.6  --   --   --   --   ALBUMIN 4.7 4.4 4.4  --   --   --   --   AST 15 14* 13*  --   --   --   --   ALT 17 13 14   --   --   --   --   ALKPHOS 78 84 77  --   --   --   --   BILITOT 0.3 0.4 0.4  --   --   --   --    < > = values in this interval not displayed.    Studies Reviewed:  ECHOCARDIOGRAM COMPLETE Result Date: 11/08/2023    ECHOCARDIOGRAM REPORT   Patient Name:   Adrienne Rogers Date of Exam: 11/08/2023 Medical Rec #:  [id number]          Height:       62.0 in Accession #:    [id number]         Weight:       169.8 lb Date of Birth:  September 03, 1940         BSA:          1.783 m Patient Age:    83 years           BP:           152/77 mmHg Patient Gender: F                  HR:           63 bpm. Exam Location:  Inpatient Procedure: 2D Echo, Cardiac Doppler and Color Doppler (Both Spectral and Color            Flow Doppler were utilized during procedure). Indications:    Pulmonary Embolus I26.09  History:        Patient has no prior history of Echocardiogram examinations.                 COPD; Risk Factors:Dyslipidemia and Hypertension.  Sonographer:    Hersey Lorenzo Referring Phys: [id number] Aisha Hove IMPRESSIONS  1. Left ventricular ejection fraction, by estimation, is 60 to 65%. The left ventricle has normal function. The left ventricle has no regional wall motion abnormalities. There is mild left ventricular hypertrophy. Left ventricular diastolic parameters are consistent with Grade I diastolic dysfunction (impaired relaxation).  2. Right ventricular systolic function is normal. The right ventricular size is normal.  3. Left atrial size was moderately dilated.  4. The mitral valve is normal in structure. Mild mitral valve regurgitation. No  evidence of mitral stenosis.  5. Tricuspid valve regurgitation is moderate.  6. The aortic valve is tricuspid. Aortic valve regurgitation is not visualized. No aortic stenosis is present.  7. The inferior vena cava is normal in size with greater than 50% respiratory variability, suggesting right atrial pressure of 3 mmHg. FINDINGS  Left Ventricle: Left ventricular ejection fraction, by estimation, is 60 to 65%. The left ventricle has normal function. The left ventricle has no regional wall motion abnormalities. The left ventricular internal cavity size was normal in size. There is  mild left ventricular hypertrophy. Left ventricular diastolic parameters are consistent with Grade I diastolic dysfunction (impaired relaxation). Right Ventricle: The right ventricular size is normal. No increase in right ventricular wall thickness. Right ventricular systolic function is normal. Left Atrium: Left atrial size was moderately dilated. Right Atrium: Right atrial size was normal in size. Pericardium: There is no evidence of pericardial effusion. Mitral Valve: The mitral valve is normal in structure. Mild mitral valve regurgitation. No evidence of mitral valve stenosis. Tricuspid Valve: The tricuspid valve is normal in structure. Tricuspid valve regurgitation is moderate . No evidence of tricuspid stenosis. Aortic Valve: The aortic valve is tricuspid. Aortic valve regurgitation is not visualized. No aortic stenosis is present. Pulmonic Valve: The pulmonic valve was normal in structure. Pulmonic valve regurgitation is mild to moderate. No evidence of pulmonic stenosis. Aorta: The aortic root is normal in size and structure. Venous: The inferior vena cava is normal in size with greater than 50% respiratory variability, suggesting right atrial pressure of 3 mmHg. IAS/Shunts: No atrial level shunt detected by color flow Doppler.  LEFT VENTRICLE PLAX 2D LVIDd:         4.80 cm   Diastology LVIDs:         2.80 cm   LV e' medial:     5.44 cm/s LV PW:         1.30 cm   LV E/e' medial:  12.4 LV IVS:        1.30 cm   LV e' lateral:   6.64 cm/s LVOT diam:     1.90 cm   LV E/e' lateral: 10.2 LV SV:         72 LV SV Index:   40 LVOT Area:     2.84 cm  RIGHT VENTRICLE            IVC RV S prime:     8.16 cm/s  IVC diam: 1.80 cm TAPSE (M-mode): 2.0 cm LEFT ATRIUM             Index        RIGHT ATRIUM           Index LA diam:        4.00 cm 2.24 cm/m   RA Area:     14.00 cm LA Vol (A2C):   56.4 ml 31.63 ml/m  RA Volume:   32.70 ml  18.34 ml/m LA Vol (A4C):   77.7 ml 43.58 ml/m LA Biplane Vol: 70.4 ml 39.48 ml/m  AORTIC VALVE LVOT Vmax:   113.00 cm/s LVOT Vmean:  74.500 cm/s LVOT VTI:    0.253 m  AORTA Ao Root diam: 3.00 cm Ao Asc diam:  3.00 cm MITRAL VALVE               TRICUSPID VALVE MV Area (PHT): 3.76 cm    TR Peak grad:  39.7 mmHg MV Decel Time: 202 msec    TR Vmax:        315.00 cm/s MV E velocity: 67.70 cm/s MV A velocity: 87.80 cm/s  SHUNTS MV E/A ratio:  0.77        Systemic VTI:  0.25 m                            Systemic Diam: 1.90 cm Dorothye Gathers MD Electronically signed by Dorothye Gathers MD Signature Date/Time: 11/08/2023/1:19:52 PM    Final    CT Angio Chest PE W and/or Wo Contrast Result Date: 11/06/2023 CLINICAL DATA:  Colon cancer staging. EXAM: CT ANGIOGRAPHY CHEST CT ABDOMEN AND PELVIS WITH CONTRAST TECHNIQUE: Multidetector CT imaging of the chest was performed using the standard protocol during bolus administration of intravenous contrast. Multiplanar CT image reconstructions and MIPs were obtained to evaluate the vascular anatomy. Multidetector CT imaging of the abdomen and pelvis was performed using the standard protocol during bolus administration of intravenous contrast. RADIATION DOSE REDUCTION: This exam was performed according to the departmental dose-optimization program which includes automated exposure control, adjustment of the mA and/or kV according to patient size and/or use of iterative reconstruction technique.  CONTRAST:  80mL OMNIPAQUE  IOHEXOL  350 MG/ML SOLN COMPARISON:  Chest radiograph dated 11/06/2023. chest CT dated 08/02/2023 and CT abdomen pelvis dated 07/22/2023. FINDINGS: CTA CHEST FINDINGS Cardiovascular: There is borderline cardiomegaly. Small pericardial effusion measuring 6 mm in thickness. The thoracic aorta is unremarkable. There is a saddle embolus straddling the bifurcation of the pulmonary trunk and extending into the central pulmonary arteries and bilateral upper and lower lobar branches. No CT evidence of right heart straining. Mediastinum/Nodes: No hilar or mediastinal adenopathy. The esophagus and the thyroid gland are grossly unremarkable. No mediastinal fluid collection. Lungs/Pleura: Minimal bibasilar subpleural atelectasis. No focal consolidation, pleural effusion or pneumothorax. The central airways are patent. Musculoskeletal: No acute osseous pathology. Degenerative changes of the spine. Review of the MIP images confirms the above findings. CT ABDOMEN and PELVIS FINDINGS No intra-abdominal free air or free fluid. Hepatobiliary: The liver is unremarkable. No biliary dilatation. Cholecystectomy. Pancreas: Unremarkable. No pancreatic ductal dilatation or surrounding inflammatory changes. Spleen: Normal in size without focal abnormality. Adrenals/Urinary Tract: The right adrenal gland is unremarkable. Nodular thickening of the lateral limb of the left adrenal gland measuring 9 mm in thickness. Mild bilateral renal parenchyma atrophy. There is no hydronephrosis on either side. There is symmetric enhancement and excretion of contrast by both kidneys. The visualized ureters and urinary bladder appear unremarkable. Stomach/Bowel: There is postsurgical changes of the bowel with anastomotic staple line in the right lower quadrant. There is no bowel obstruction or active inflammation. Appendectomy. Vascular/Lymphatic: Moderate aortoiliac atherosclerotic disease. The IVC is unremarkable. There is a  nonocclusive thrombus in the SMV extending to the junction of the SMV and main portal vein. The main portal vein is patent. The left portal vein branch is not visualized and is likely occluded. The right branch of the portal vein is patent. No portal venous gas. There is no adenopathy. Reproductive: Hysterectomy.  No suspicious adnexal mass. Other: Midline vertical anterior abdominal wall incisional scar. Musculoskeletal: Degenerative changes of the spine. Grade 1 L4-L5 anterolisthesis. No acute osseous pathology. Review of the MIP images confirms the above findings. IMPRESSION: 1. Saddle embolus.  No CT evidence of right heart straining. 2. 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 The  left portal vein appears occluded. 3. Postsurgical changes of the bowel. No bowel obstruction. 4. No evidence of metastatic disease in the chest, abdomen, or pelvis. 5.  Aortic Atherosclerosis (ICD10-I70.0). These results were called by telephone at the time of interpretation on 11/06/2023 at 5:55 p.m. to provider DAVID YAO , who verbally acknowledged these results. Electronically Signed   By: Angus Bark M.D.   On: 11/06/2023 18:01   CT ABDOMEN PELVIS W CONTRAST Result Date: 11/06/2023 CLINICAL DATA:  Colon cancer staging. EXAM: CT ANGIOGRAPHY CHEST CT ABDOMEN AND PELVIS WITH CONTRAST TECHNIQUE: Multidetector CT imaging of the chest was performed using the standard protocol during bolus administration of intravenous contrast. Multiplanar CT image reconstructions and MIPs were obtained to evaluate the vascular anatomy. Multidetector CT imaging of the abdomen and pelvis was performed using the standard protocol during bolus administration of intravenous contrast. RADIATION DOSE REDUCTION: This exam was performed according to the departmental dose-optimization program which includes automated exposure control, adjustment of the mA and/or kV according to patient size and/or use of  iterative reconstruction technique. CONTRAST:  80mL OMNIPAQUE  IOHEXOL  350 MG/ML SOLN COMPARISON:  Chest radiograph dated 11/06/2023. chest CT dated 08/02/2023 and CT abdomen pelvis dated 07/22/2023. FINDINGS: CTA CHEST FINDINGS Cardiovascular: There is borderline cardiomegaly. Small pericardial effusion measuring 6 mm in thickness. The thoracic aorta is unremarkable. There is a saddle embolus straddling the bifurcation of the pulmonary trunk and extending into the central pulmonary arteries and bilateral upper and lower lobar branches. No CT evidence of right heart straining. Mediastinum/Nodes: No hilar or mediastinal adenopathy. The esophagus and the thyroid gland are grossly unremarkable. No mediastinal fluid collection. Lungs/Pleura: Minimal bibasilar subpleural atelectasis. No focal consolidation, pleural effusion or pneumothorax. The central airways are patent. Musculoskeletal: No acute osseous pathology. Degenerative changes of the spine. Review of the MIP images confirms the above findings. CT ABDOMEN and PELVIS FINDINGS No intra-abdominal free air or free fluid. Hepatobiliary: The liver is unremarkable. No biliary dilatation. Cholecystectomy. Pancreas: Unremarkable. No pancreatic ductal dilatation or surrounding inflammatory changes. Spleen: Normal in size without focal abnormality. Adrenals/Urinary Tract: The right adrenal gland is unremarkable. Nodular thickening of the lateral limb of the left adrenal gland measuring 9 mm in thickness. Mild bilateral renal parenchyma atrophy. There is no hydronephrosis on either side. There is symmetric enhancement and excretion of contrast by both kidneys. The visualized ureters and urinary bladder appear unremarkable. Stomach/Bowel: There is postsurgical changes of the bowel with anastomotic staple line in the right lower quadrant. There is no bowel obstruction or active inflammation. Appendectomy. Vascular/Lymphatic: Moderate aortoiliac atherosclerotic disease. The IVC  is unremarkable. 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 The main portal vein is patent. The left portal vein branch is not visualized and is likely occluded. The right branch of the portal vein is patent. No portal venous gas. There is no adenopathy. Reproductive: Hysterectomy.  No suspicious adnexal mass. Other: Midline vertical anterior abdominal wall incisional scar. Musculoskeletal: Degenerative changes of the spine. Grade 1 L4-L5 anterolisthesis. No acute osseous pathology. Review of the MIP images confirms the above findings. IMPRESSION: 1. Saddle embolus.  No CT evidence of right heart straining. 2. 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 3. Postsurgical changes of the bowel. No bowel obstruction. 4. No evidence of metastatic disease in the chest, abdomen, or pelvis. 5.  Aortic Atherosclerosis (ICD10-I70.0).  These results were called by telephone at the time of interpretation on 11/06/2023 at 5:55 p.m. to provider DAVID YAO , who verbally acknowledged these results. Electronically Signed   By: Angus Bark M.D.   On: 11/06/2023 18:01   DG Chest 2 View Result Date: 11/06/2023 CLINICAL DATA:  Chest pain and shortness of breath.  Colon cancer. EXAM: CHEST - 2 VIEW COMPARISON:  Two-view chest x-ray 07/24/2017.  CT chest 08/02/2023 FINDINGS: Heart size is normal. Lungs are clear. No edema or effusion is present. No focal airspace disease present. Mild degenerative changes are noted in the thoracic spine. Cervical spine surgery noted. IMPRESSION: No acute cardiopulmonary disease. Electronically Signed   By: Audree Leas M.D.   On: 11/06/2023 14:43   DG ESOPHAGUS W DOUBLE CM (HD) Result Date: 10/18/2023 CLINICAL DATA:  Dysphagia.  Gastroesophageal reflux disease. EXAM: ESOPHAGUS/BARIUM SWALLOW/TABLET STUDY TECHNIQUE: Combined double and single  contrast examination was performed using effervescent crystals, high-density barium, and thin liquid barium. FLUOROSCOPY: Radiation Exposure Index (as provided by the fluoroscopic device): 7.8 mGy Kerma COMPARISON:  Esophagram 10/30/2015 FINDINGS: Swallowing: Appears normal. No vestibular penetration or aspiration seen. Pharynx: Unremarkable. Esophagus: Normal appearance. Esophageal motility: Mild esophageal dysmotility. No significant tertiary contractions. Hiatal Hernia: The previously shown small sliding hiatal hernia was not readily apparent during today's examination. Gastroesophageal reflux: None visualized. Ingested 13 mm barium tablet: Passed normally. IMPRESSION: Mild esophageal dysmotility, otherwise unremarkable esophagram. Electronically Signed   By: Aundra Lee M.D.   On: 10/18/2023 14:39   Ms. Binkowski reports feeling better.  His daughter was at the bedside when I saw her at approximately 7 AM.  We reviewed the CT findings including the diagnosis of pulmonary embolism and SMV/portal vein thrombosis.  Her risks for venous thromboembolic disease include recent abdominal surgery, chemotherapy, and immobility. I recommend continuing heparin anticoagulation today and then transition to apixaban.  Lower extremity Dopplers have not been completed. She will be scheduled for outpatient follow-up at the Cancer center next week with the plan to resume adjuvant capecitabine  on 11/17/2023.

## 2023-11-09 NOTE — Progress Notes (Signed)
 OT Cancellation Note  Patient Details Name: Adrienne Rogers MRN: [id number] DOB: Dec 26, 1940   Cancelled Treatment:    Reason Eval/Treat Not Completed: Patient at procedure or test/ unavailable Patient is currently having doppler testing completed in room. OT to continue to follow and check back as schedule will allow.  Wynette Heckler, MS Acute Rehabilitation Department Office# [phone]  11/09/2023, 2:10 PM

## 2023-11-09 NOTE — Progress Notes (Signed)
 PHARMACY - ANTICOAGULATION CONSULT NOTE  Pharmacy Consult for IV UFH Indication: pulmonary embolus  Allergies  Allergen Reactions   Codeine Itching, Rash and Other (See Comments)   Lisinopril Other (See Comments)    Tinnitus   Ezetimibe-Simvastatin  Other (See Comments)    Muscle Pain   Fosamax  [Alendronate Sodium] Other (See Comments)    Heartburn   Sulfa Antibiotics Hives, Itching, Rash and Other (See Comments)    ITCHING,WATERING EYES   Bupropion  Other (See Comments)    vertigo   Doxepin Swelling   Prazosin Other (See Comments)    Low blood pressure   Tetracyclines & Related Other (See Comments) and Nausea And Vomiting    Swelling eruption of the lips    Patient Measurements: Height: 5\' 2"  (157.5 cm) Weight: 77 kg (169 lb 12.1 oz) IBW/kg (Calculated) : 50.1 HEPARIN DW (KG): 66.9  Vital Signs: Temp: 98.6 F (37 C) (05/07 0606) Temp Source: Oral (05/07 0606) BP: 160/85 (05/07 0606) Pulse Rate: 57 (05/07 0606)  Labs: Recent Labs    11/06/23 1404 11/06/23 2139 11/07/23 0326 11/07/23 1101 11/07/23 2107 11/08/23 0536 11/09/23 0535  HGB 11.0*  --  10.1*  --   --  9.9* 10.9*  HCT 33.1*  --  30.1*  --   --  31.0* 33.8*  PLT 133*  --  129*  --   --  151 163  HEPARINUNFRC  --   --  0.62   < > 0.64 0.70 0.79*  CREATININE 1.56*  --  1.27*  --   --  1.30* 1.26*  TROPONINIHS 8 19*  --   --   --   --   --    < > = values in this interval not displayed.    Estimated Creatinine Clearance: 33.7 mL/min (A) (by C-G formula based on SCr of 1.26 mg/dL (H)).   Medical History: Past Medical History:  Diagnosis Date   Asthma    COPD (chronic obstructive pulmonary disease) (HCC)    Depression    Hyperlipidemia    Hypertension    PTSD (post-traumatic stress disorder)      Assessment: 83 year old female presented with shortness of breath, imaging with saddle embolus as well as nonocclusive thrombus in the SMV. Pharmacy consulted to start heparin infusion.   Baseline  CBC: Hgb 11, Plt 133. Not on anticoagulation prior to admission.  11/09/2023 -HL 0.79 - supra-therapeutic on heparin 900 units/hr -CBC, Scr stable -No bleeding or infusion complications of therapy reported by RN  Goal of Therapy:  Heparin level 0.3-0.7 units/ml Monitor platelets by anticoagulation protocol: Yes   Plan:  -Decrease heparin infusion to 750 units/hr -Recheck heparin level in 8 hrs  -Daily CBC and heparin level -Note plan to transition to oral anticoagulation after 24-48 hrs of heparin therapy  Arie Kurtz, PharmD, BCPS Clinical Pharmacist 11/09/2023 6:23 AM

## 2023-11-09 NOTE — Progress Notes (Signed)
 Per Dr. Scherrie Curt: being d/c from hospital and needs f/u on 5/15 w/labs. High priority scheduling message sent.

## 2023-11-09 NOTE — Progress Notes (Signed)
   11/09/23 2253  BiPAP/CPAP/SIPAP  BiPAP/CPAP/SIPAP Pt Type Adult  BiPAP/CPAP/SIPAP DREAMSTATIOND  Reason BIPAP/CPAP not in use Non-compliant  Dentures removed? Not applicable

## 2023-11-09 NOTE — Evaluation (Signed)
 Physical Therapy Evaluation Patient Details Name: Adrienne Rogers MRN: [id number] DOB: 08/08/1940 Today's Date: 11/09/2023  History of Present Illness  83 yo female admitted with PE. Hx of ex lap, colectomy, R TKA, anxiety, colon ca, ckd, obesity, vertigo, PTSD  Clinical Impression  On eval, pt was CGA for mobility. She walked ~75 feet with a RW. She c/o some weakness. BP 114/67, HR 66 bpm, O2 97% on RA during session. Encouraged pt to sit up as tolerated. Will recommend HHPT f/u.      If plan is discharge home, recommend the following:     Can travel by private vehicle        Equipment Recommendations None recommended by PT  Recommendations for Other Services       Functional Status Assessment Patient has had a recent decline in their functional status and demonstrates the ability to make significant improvements in function in a reasonable and predictable amount of time.     Precautions / Restrictions Precautions Precautions: Fall Restrictions Weight Bearing Restrictions Per Provider Order: No      Mobility  Bed Mobility Overal bed mobility: Needs Assistance Bed Mobility: Supine to Sit     Supine to sit: Supervision, HOB elevated     General bed mobility comments: supv for lines only    Transfers Overall transfer level: Needs assistance Equipment used: Rolling walker (2 wheels) Transfers: Sit to/from Stand Sit to Stand: Supervision           General transfer comment: cues for safety, hand placement    Ambulation/Gait Ambulation/Gait assistance: Contact guard assist Gait Distance (Feet): 75 Feet (12'x2) Assistive device: Rolling walker (2 wheels) Gait Pattern/deviations: Step-through pattern, Decreased stride length       General Gait Details: Pt walked to//from bathroom-c/o feeling weak. BP ok. After brief rest, pt walked in hallway. Tolerated well  Stairs            Wheelchair Mobility     Tilt Bed    Modified Rankin (Stroke  Patients Only)       Balance Overall balance assessment: Needs assistance         Standing balance support: Bilateral upper extremity supported, During functional activity, Reliant on assistive device for balance Standing balance-Leahy Scale: Fair                               Pertinent Vitals/Pain Pain Assessment Pain Assessment: No/denies pain    Home Living Family/patient expects to be discharged to:: Private residence Living Arrangements: Alone Available Help at Discharge: Personal care attendant;Available PRN/intermittently Type of Home: House Home Access: Level entry       Home Layout: One level Home Equipment: Rollator (4 wheels);Cane - single point      Prior Function Prior Level of Function : Needs assist             Mobility Comments: uses rollator as needed       Extremity/Trunk Assessment   Upper Extremity Assessment Upper Extremity Assessment: Defer to OT evaluation    Lower Extremity Assessment Lower Extremity Assessment: Generalized weakness    Cervical / Trunk Assessment Cervical / Trunk Assessment: Normal  Communication   Communication Communication: No apparent difficulties    Cognition Arousal: Alert Behavior During Therapy: WFL for tasks assessed/performed   PT - Cognitive impairments: No apparent impairments  Following commands: Intact       Cueing Cueing Techniques: Verbal cues     General Comments      Exercises     Assessment/Plan    PT Assessment Patient needs continued PT services  PT Problem List Decreased strength;Decreased activity tolerance;Decreased balance;Decreased mobility;Decreased knowledge of use of DME       PT Treatment Interventions DME instruction;Gait training;Functional mobility training;Therapeutic activities;Therapeutic exercise;Patient/family education;Balance training    PT Goals (Current goals can be found in the Care Plan section)  Acute  Rehab PT Goals Patient Stated Goal: get back home to her dog PT Goal Formulation: With patient Time For Goal Achievement: 11/23/23 Potential to Achieve Goals: Good    Frequency Min 3X/week     Co-evaluation               AM-PAC PT "6 Clicks" Mobility  Outcome Measure Help needed turning from your back to your side while in a flat bed without using bedrails?: None Help needed moving from lying on your back to sitting on the side of a flat bed without using bedrails?: None Help needed moving to and from a bed to a chair (including a wheelchair)?: A Little Help needed standing up from a chair using your arms (e.g., wheelchair or bedside chair)?: A Little Help needed to walk in hospital room?: A Little Help needed climbing 3-5 steps with a railing? : A Lot 6 Click Score: 19    End of Session Equipment Utilized During Treatment: Gait belt Activity Tolerance: Patient tolerated treatment well;Patient limited by fatigue Patient left: in chair;with call bell/phone within reach;with chair alarm set   PT Visit Diagnosis: Difficulty in walking, not elsewhere classified (R26.2);Muscle weakness (generalized) (M62.81)    Time: 1610-9604 PT Time Calculation (min) (ACUTE ONLY): 14 min   Charges:   PT Evaluation $PT Eval Low Complexity: 1 Low   PT General Charges $$ ACUTE PT VISIT: 1 Visit           Tanda Falter, PT Acute Rehabilitation  Office: [phone]

## 2023-11-10 ENCOUNTER — Other Ambulatory Visit (HOSPITAL_COMMUNITY): Payer: Self-pay

## 2023-11-10 DIAGNOSIS — D649 Anemia, unspecified: Secondary | ICD-10-CM | POA: Diagnosis not present

## 2023-11-10 DIAGNOSIS — C184 Malignant neoplasm of transverse colon: Secondary | ICD-10-CM | POA: Diagnosis not present

## 2023-11-10 DIAGNOSIS — E669 Obesity, unspecified: Secondary | ICD-10-CM | POA: Diagnosis not present

## 2023-11-10 DIAGNOSIS — I2692 Saddle embolus of pulmonary artery without acute cor pulmonale: Secondary | ICD-10-CM | POA: Diagnosis not present

## 2023-11-10 DIAGNOSIS — I82512 Chronic embolism and thrombosis of left femoral vein: Secondary | ICD-10-CM

## 2023-11-10 DIAGNOSIS — E782 Mixed hyperlipidemia: Secondary | ICD-10-CM

## 2023-11-10 DIAGNOSIS — G4710 Hypersomnia, unspecified: Secondary | ICD-10-CM | POA: Diagnosis not present

## 2023-11-10 LAB — BASIC METABOLIC PANEL WITH GFR
Anion gap: 10 (ref 5–15)
BUN: 27 mg/dL — ABNORMAL HIGH (ref 8–23)
CO2: 23 mmol/L (ref 22–32)
Calcium: 9.4 mg/dL (ref 8.9–10.3)
Chloride: 106 mmol/L (ref 98–111)
Creatinine, Ser: 1.5 mg/dL — ABNORMAL HIGH (ref 0.44–1.00)
GFR, Estimated: 35 mL/min — ABNORMAL LOW (ref >60)
Glucose, Bld: 95 mg/dL (ref 70–99)
Potassium: 3.9 mmol/L (ref 3.5–5.1)
Sodium: 139 mmol/L (ref 135–145)

## 2023-11-10 LAB — CBC
HCT: 32.8 % — ABNORMAL LOW (ref 36.0–46.0)
Hemoglobin: 10.6 g/dL — ABNORMAL LOW (ref 12.0–15.0)
MCH: 30.5 pg (ref 26.0–34.0)
MCHC: 32.3 g/dL (ref 30.0–36.0)
MCV: 94.3 fL (ref 80.0–100.0)
Platelets: 174 10*3/uL (ref 150–400)
RBC: 3.48 MIL/uL — ABNORMAL LOW (ref 3.87–5.11)
RDW: 27.7 % — ABNORMAL HIGH (ref 11.5–15.5)
WBC: 5 10*3/uL (ref 4.0–10.5)
nRBC: 0 % (ref 0.0–0.2)

## 2023-11-10 LAB — HEPARIN LEVEL (UNFRACTIONATED): Heparin Unfractionated: 0.28 [IU]/mL — ABNORMAL LOW (ref 0.30–0.70)

## 2023-11-10 MED ORDER — APIXABAN (ELIQUIS) VTE STARTER PACK (10MG AND 5MG)
ORAL_TABLET | ORAL | 0 refills | Status: AC
  Filled 2023-11-10: qty 74, 30d supply, fill #0

## 2023-11-10 MED ORDER — APIXABAN 5 MG PO TABS
10.0000 mg | ORAL_TABLET | Freq: Two times a day (BID) | ORAL | Status: DC
  Administered 2023-11-10: 10 mg via ORAL
  Filled 2023-11-10: qty 2

## 2023-11-10 MED ORDER — APIXABAN 5 MG PO TABS: 5.0000 mg | ORAL_TABLET | Freq: Two times a day (BID) | ORAL | Status: DC

## 2023-11-10 NOTE — Progress Notes (Addendum)
 Adrienne Rogers   DOB:Nov 02, 1940   WU#:[id number]      ASSESSMENT & PLAN:  Pulmonary embolism, saddle embolus Hx of chronic DVT LLE - CT angio chest done 11/06/2023 shows saddle embolus. - Status post IV heparin.  Continue Apixaban 10 mg po bid x7 days initiated 11/10/23, then decrease to 5 mg po bid.   - Bil LE venous Dopplers done 11/09/23, neg RLE, chronic DVT LLE. - Monitor closely for bleeding, no bleeding noted at this time   Invasive Colon Adenocarcinoma (pT3,pN1b,cM0) - Diagnosed 07/2023.  No evidence of mets per CT chest done 08/02/2023. - Status post colectomy 08/03/2023 -Started adjuvant capecitabine  09/08/2023.  On cycle 3 capecitabine , started 10/20/2023. - Medical oncology/Dr. Scherrie Curt following closely.  Patient to follow up as outpatient with the plan to continue adjuvant capecitabine  chemotherapy.   Anemia, normocytic - Hemoglobin stable 10.6 - Baseline hemoglobin in the 10-11 range - Transfuse PRBC for Hgb <7 0.0-7.5.  No transfusional intervention warranted at this time - Continue to monitor CBC with differential   Difficulty ambulating - Patient ambulates with walker. - Continue supportive care   Hypertension Hyperlipidemia History of asthma - Continue to monitor blood sugar levels closely - On statin - On CPAP and inhalers as needed, also O2 therapy although patient currently does not have O2 in use. - Continue supportive care   SMV/portal vein thrombosis  -- on CT abdomen 11/06/2023 -  Likely secondary to colon surgery      Code Status DNR-Limited  Subjective:  Patient seen awake and alert laying in bed.  Reports she feels well.  IV heparin has been turned off and patient has transitioned to Eliquis per protocol.  Discussed this with patient and she is agreeable to taking this as prescribed.  She feels well and has no complaints.  No other acute distress is noted.  Objective:  Vitals:   11/10/23 0543 11/10/23 0852  BP: 132/72   Pulse: 65   Resp: 18    Temp: 97.8 F (36.6 C)   SpO2: 100% 100%     Intake/Output Summary (Last 24 hours) at 11/10/2023 0956 Last data filed at 11/09/2023 1313 Gross per 24 hour  Intake 152.63 ml  Output --  Net 152.63 ml     PHYSICAL EXAMINATION: ECOG PERFORMANCE STATUS: 2 - Symptomatic, <50% confined to bed  Vitals:   11/10/23 0543 11/10/23 0852  BP: 132/72   Pulse: 65   Resp: 18   Temp: 97.8 F (36.6 C)   SpO2: 100% 100%   Filed Weights   11/06/23 1337  Weight: 169 lb 12.1 oz (77 kg)    GENERAL: alert, no distress and comfortable SKIN: skin color, texture, turgor are normal, no rashes or significant lesions  +Darkened area over patient's nose EYES: normal, conjunctiva are pink and non-injected, sclera clear OROPHARYNX: no exudate, no erythema and lips, buccal mucosa, and tongue normal  NECK: supple, thyroid normal size, non-tender, without nodularity LYMPH: no palpable lymphadenopathy in the cervical, axillary or inguinal LUNGS: clear to auscultation and percussion with normal breathing effort HEART: regular rate & rhythm and no murmurs and no lower extremity edema ABDOMEN: abdomen soft, non-tender and normal bowel sounds MUSCULOSKELETAL: + Ambulates with assist PSYCH: alert & oriented x 3 with fluent speech NEURO: no focal motor/sensory deficits   All questions were answered. The patient knows to call the clinic with any problems, questions or concerns.   The total time spent in the appointment was 40 minutes encounter with patient including review  of chart and various tests results, discussions about plan of care and coordination of care plan  Jacqualin Mate, NP 11/10/2023 9:56 AM    Labs Reviewed:  Lab Results  Component Value Date   WBC 5.0 11/10/2023   HGB 10.6 (L) 11/10/2023   HCT 32.8 (L) 11/10/2023   MCV 94.3 11/10/2023   PLT 174 11/10/2023   Recent Labs    09/27/23 1334 10/19/23 1025 11/06/23 1404 11/08/23 0536 11/09/23 0535 11/09/23 1436 11/10/23 0520  NA 141  141   < > 139 137  --  139  K 4.1 4.1   < > 4.1 3.9  --  3.9  CL 105 107   < > 109 106  --  106  CO2 27 25   < > 23 23  --  23  GLUCOSE 101* 146*   < > 99 97  --  95  BUN 19 34*   < > 24* 22  --  27*  CREATININE 1.28* 1.45*   < > 1.30* 1.26*  --  1.50*  CALCIUM  9.8 9.4   < > 9.4 9.6  --  9.4  GFRNONAA 42* 36*   < > 41* 43*  --  35*  PROT 7.0 6.6  --   --   --  6.5  --   ALBUMIN 4.4 4.4  --   --   --  3.7  --   AST 14* 13*  --   --   --  14*  --   ALT 13 14  --   --   --  13  --   ALKPHOS 84 77  --   --   --  76  --   BILITOT 0.4 0.4  --   --   --  0.8  --   BILIDIR  --   --   --   --   --  <0.1  --   IBILI  --   --   --   --   --  NOT CALCULATED  --    < > = values in this interval not displayed.    Studies Reviewed:  VAS US  LOWER EXTREMITY VENOUS (DVT) Result Date: 11/09/2023  Lower Venous DVT Study [name]  Date of Exam:   11/09/2023 Medical Rec #: [id number]           Accession #:    [id number] Date of Birth: Aug 16, 1940          Patient Gender: F Patient Age:   86 years Exam Location:  [facility] Procedure:      VAS US  LOWER EXTREMITY VENOUS (DVT) Referring Phys: Aisha Hove --------------------------------------------------------------------------------  Indications: Pulmonary embolism.  Risk Factors: Hx of PE. Comparison Study: Novel thrombus seen since previous exam 10/27/11. Performing Technologist: Estanislao Heimlich  Examination Guidelines: A complete evaluation includes B-mode imaging, spectral Doppler, color Doppler, and power Doppler as needed of all accessible portions of each vessel. Bilateral testing is considered an integral part of a complete examination. Limited examinations for reoccurring indications may be performed as noted. The reflux portion of the exam is performed with the patient in reverse Trendelenburg.  +---------+---------------+---------+-----------+----------+--------------+ RIGHT     CompressibilityPhasicitySpontaneityPropertiesThrombus Aging +---------+---------------+---------+-----------+----------+--------------+ CFV      Full           Yes      Yes                                 +---------+---------------+---------+-----------+----------+--------------+  SFJ      Full                                                        +---------+---------------+---------+-----------+----------+--------------+ FV Prox  Full                                                        +---------+---------------+---------+-----------+----------+--------------+ FV Mid   Full                                                        +---------+---------------+---------+-----------+----------+--------------+ FV DistalFull                                                        +---------+---------------+---------+-----------+----------+--------------+ PFV      Full                                                        +---------+---------------+---------+-----------+----------+--------------+ POP      Full           Yes      Yes                                 +---------+---------------+---------+-----------+----------+--------------+ PTV      Full                    Yes                                 +---------+---------------+---------+-----------+----------+--------------+ PERO     Full                    Yes                                 +---------+---------------+---------+-----------+----------+--------------+   +--------+---------------+---------+-----------+-----------------+-------------+ LEFT    CompressibilityPhasicitySpontaneityProperties       Thrombus                                                                  Aging         +--------+---------------+---------+-----------+-----------------+-------------+ CFV     Full           Yes      Yes                                        +--------+---------------+---------+-----------+-----------------+-------------+  SFJ     Full                                                              +--------+---------------+---------+-----------+-----------------+-------------+ FV Prox Full                                                              +--------+---------------+---------+-----------+-----------------+-------------+ FV Mid  Full                                                              +--------+---------------+---------+-----------+-----------------+-------------+ FV      Partial                 Yes                                       Distal                                                                    +--------+---------------+---------+-----------+-----------------+-------------+ PFV     Full                                                              +--------+---------------+---------+-----------+-----------------+-------------+ POP     None           No       No         spongy           Chronic                                                  w/compression                  +--------+---------------+---------+-----------+-----------------+-------------+ PTV     Full                               softly echogenic Chronic       +--------+---------------+---------+-----------+-----------------+-------------+ PERO    Full                                                              +--------+---------------+---------+-----------+-----------------+-------------+  Summary: RIGHT: - There is no evidence of deep vein thrombosis in the lower extremity.  - No cystic structure found in the popliteal fossa.  LEFT: - Findings consistent with chronic deep vein thrombosis involving the left femoral vein, and left popliteal vein.  - No cystic structure found in the popliteal fossa.  *See table(s) above for measurements and observations. Electronically signed by Genny Kid MD on 11/09/2023 at 5:31:18 PM.    Final    ECHOCARDIOGRAM COMPLETE Result Date: 11/08/2023    ECHOCARDIOGRAM REPORT   Patient Name:   Adrienne Rogers Date of Exam: 11/08/2023 Medical Rec #:  [id number]          Height:       62.0 in Accession #:    [id number]         Weight:       169.8 lb Date of Birth:  1940/07/23         BSA:          1.783 m Patient Age:    82 years           BP:           152/77 mmHg Patient Gender: F                  HR:           63 bpm. Exam Location:  Inpatient Procedure: 2D Echo, Cardiac Doppler and Color Doppler (Both Spectral and Color            Flow Doppler were utilized during procedure). Indications:    Pulmonary Embolus I26.09  History:        Patient has no prior history of Echocardiogram examinations.                 COPD; Risk Factors:Dyslipidemia and Hypertension.  Sonographer:    Hersey Lorenzo Referring Phys: [id number] Aisha Hove IMPRESSIONS  1. Left ventricular ejection fraction, by estimation, is 60 to 65%. The left ventricle has normal function. The left ventricle has no regional wall motion abnormalities. There is mild left ventricular hypertrophy. Left ventricular diastolic parameters are consistent with Grade I diastolic dysfunction (impaired relaxation).  2. Right ventricular systolic function is normal. The right ventricular size is normal.  3. Left atrial size was moderately dilated.  4. The mitral valve is normal in structure. Mild mitral valve regurgitation. No evidence of mitral stenosis.  5. Tricuspid valve regurgitation is moderate.  6. The aortic valve is tricuspid. Aortic valve regurgitation is not visualized. No aortic stenosis is present.  7. The inferior vena cava is normal in size with greater than 50% respiratory variability, suggesting right atrial pressure of 3 mmHg. FINDINGS  Left Ventricle: Left ventricular ejection fraction, by estimation, is 60 to 65%. The left ventricle has normal function. The left ventricle has no regional wall  motion abnormalities. The left ventricular internal cavity size was normal in size. There is  mild left ventricular hypertrophy. Left ventricular diastolic parameters are consistent with Grade I diastolic dysfunction (impaired relaxation). Right Ventricle: The right ventricular size is normal. No increase in right ventricular wall thickness. Right ventricular systolic function is normal. Left Atrium: Left atrial size was moderately dilated. Right Atrium: Right atrial size was normal in size. Pericardium: There is no evidence of pericardial effusion. Mitral Valve: The mitral valve is normal in structure. Mild mitral valve regurgitation. No evidence of mitral valve stenosis. Tricuspid Valve: The tricuspid valve is normal in structure. Tricuspid valve regurgitation  is moderate . No evidence of tricuspid stenosis. Aortic Valve: The aortic valve is tricuspid. Aortic valve regurgitation is not visualized. No aortic stenosis is present. Pulmonic Valve: The pulmonic valve was normal in structure. Pulmonic valve regurgitation is mild to moderate. No evidence of pulmonic stenosis. Aorta: The aortic root is normal in size and structure. Venous: The inferior vena cava is normal in size with greater than 50% respiratory variability, suggesting right atrial pressure of 3 mmHg. IAS/Shunts: No atrial level shunt detected by color flow Doppler.  LEFT VENTRICLE PLAX 2D LVIDd:         4.80 cm   Diastology LVIDs:         2.80 cm   LV e' medial:    5.44 cm/s LV PW:         1.30 cm   LV E/e' medial:  12.4 LV IVS:        1.30 cm   LV e' lateral:   6.64 cm/s LVOT diam:     1.90 cm   LV E/e' lateral: 10.2 LV SV:         72 LV SV Index:   40 LVOT Area:     2.84 cm  RIGHT VENTRICLE            IVC RV S prime:     8.16 cm/s  IVC diam: 1.80 cm TAPSE (M-mode): 2.0 cm LEFT ATRIUM             Index        RIGHT ATRIUM           Index LA diam:        4.00 cm 2.24 cm/m   RA Area:     14.00 cm LA Vol (A2C):   56.4 ml 31.63 ml/m  RA Volume:   32.70  ml  18.34 ml/m LA Vol (A4C):   77.7 ml 43.58 ml/m LA Biplane Vol: 70.4 ml 39.48 ml/m  AORTIC VALVE LVOT Vmax:   113.00 cm/s LVOT Vmean:  74.500 cm/s LVOT VTI:    0.253 m  AORTA Ao Root diam: 3.00 cm Ao Asc diam:  3.00 cm MITRAL VALVE               TRICUSPID VALVE MV Area (PHT): 3.76 cm    TR Peak grad:   39.7 mmHg MV Decel Time: 202 msec    TR Vmax:        315.00 cm/s MV E velocity: 67.70 cm/s MV A velocity: 87.80 cm/s  SHUNTS MV E/A ratio:  0.77        Systemic VTI:  0.25 m                            Systemic Diam: 1.90 cm Dorothye Gathers MD Electronically signed by Dorothye Gathers MD Signature Date/Time: 11/08/2023/1:19:52 PM    Final    CT Angio Chest PE W and/or Wo Contrast Result Date: 11/06/2023 CLINICAL DATA:  Colon cancer staging. EXAM: CT ANGIOGRAPHY CHEST CT ABDOMEN AND PELVIS WITH CONTRAST TECHNIQUE: Multidetector CT imaging of the chest was performed using the standard protocol during bolus administration of intravenous contrast. Multiplanar CT image reconstructions and MIPs were obtained to evaluate the vascular anatomy. Multidetector CT imaging of the abdomen and pelvis was performed using the standard protocol during bolus administration of intravenous contrast. RADIATION DOSE REDUCTION: This exam was performed according to the departmental dose-optimization program which includes automated exposure control, adjustment of the  mA and/or kV according to patient size and/or use of iterative reconstruction technique. CONTRAST:  80mL OMNIPAQUE  IOHEXOL  350 MG/ML SOLN COMPARISON:  Chest radiograph dated 11/06/2023. chest CT dated 08/02/2023 and CT abdomen pelvis dated 07/22/2023. FINDINGS: CTA CHEST FINDINGS Cardiovascular: There is borderline cardiomegaly. Small pericardial effusion measuring 6 mm in thickness. The thoracic aorta is unremarkable. There is a saddle embolus straddling the bifurcation of the pulmonary trunk and extending into the central pulmonary arteries and bilateral upper and lower lobar  branches. No CT evidence of right heart straining. Mediastinum/Nodes: No hilar or mediastinal adenopathy. The esophagus and the thyroid gland are grossly unremarkable. No mediastinal fluid collection. Lungs/Pleura: Minimal bibasilar subpleural atelectasis. No focal consolidation, pleural effusion or pneumothorax. The central airways are patent. Musculoskeletal: No acute osseous pathology. Degenerative changes of the spine. Review of the MIP images confirms the above findings. CT ABDOMEN and PELVIS FINDINGS No intra-abdominal free air or free fluid. Hepatobiliary: The liver is unremarkable. No biliary dilatation. Cholecystectomy. Pancreas: Unremarkable. No pancreatic ductal dilatation or surrounding inflammatory changes. Spleen: Normal in size without focal abnormality. Adrenals/Urinary Tract: The right adrenal gland is unremarkable. Nodular thickening of the lateral limb of the left adrenal gland measuring 9 mm in thickness. Mild bilateral renal parenchyma atrophy. There is no hydronephrosis on either side. There is symmetric enhancement and excretion of contrast by both kidneys. The visualized ureters and urinary bladder appear unremarkable. Stomach/Bowel: There is postsurgical changes of the bowel with anastomotic staple line in the right lower quadrant. There is no bowel obstruction or active inflammation. Appendectomy. Vascular/Lymphatic: Moderate aortoiliac atherosclerotic disease. The IVC is unremarkable. 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 The main portal vein is patent. The left portal vein branch is not visualized and is likely occluded. The right branch of the portal vein is patent. No portal venous gas. There is no adenopathy. Reproductive: Hysterectomy.  No suspicious adnexal mass. Other: Midline vertical anterior abdominal wall incisional scar. Musculoskeletal: Degenerative changes of the spine. Grade 1 L4-L5 anterolisthesis. No acute  osseous pathology. Review of the MIP images confirms the above findings. IMPRESSION: 1. Saddle embolus.  No CT evidence of right heart straining. 2. 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 3. Postsurgical changes of the bowel. No bowel obstruction. 4. No evidence of metastatic disease in the chest, abdomen, or pelvis. 5.  Aortic Atherosclerosis (ICD10-I70.0). These results were called by telephone at the time of interpretation on 11/06/2023 at 5:55 p.m. to provider DAVID YAO , who verbally acknowledged these results. Electronically Signed   By: Angus Bark M.D.   On: 11/06/2023 18:01   CT ABDOMEN PELVIS W CONTRAST Result Date: 11/06/2023 CLINICAL DATA:  Colon cancer staging. EXAM: CT ANGIOGRAPHY CHEST CT ABDOMEN AND PELVIS WITH CONTRAST TECHNIQUE: Multidetector CT imaging of the chest was performed using the standard protocol during bolus administration of intravenous contrast. Multiplanar CT image reconstructions and MIPs were obtained to evaluate the vascular anatomy. Multidetector CT imaging of the abdomen and pelvis was performed using the standard protocol during bolus administration of intravenous contrast. RADIATION DOSE REDUCTION: This exam was performed according to the departmental dose-optimization program which includes automated exposure control, adjustment of the mA and/or kV according to patient size and/or use of iterative reconstruction technique. CONTRAST:  80mL OMNIPAQUE  IOHEXOL  350 MG/ML SOLN COMPARISON:  Chest radiograph dated 11/06/2023. chest CT dated 08/02/2023  and CT abdomen pelvis dated 07/22/2023. FINDINGS: CTA CHEST FINDINGS Cardiovascular: There is borderline cardiomegaly. Small pericardial effusion measuring 6 mm in thickness. The thoracic aorta is unremarkable. There is a saddle embolus straddling the bifurcation of the pulmonary trunk and extending into the central pulmonary arteries and  bilateral upper and lower lobar branches. No CT evidence of right heart straining. Mediastinum/Nodes: No hilar or mediastinal adenopathy. The esophagus and the thyroid gland are grossly unremarkable. No mediastinal fluid collection. Lungs/Pleura: Minimal bibasilar subpleural atelectasis. No focal consolidation, pleural effusion or pneumothorax. The central airways are patent. Musculoskeletal: No acute osseous pathology. Degenerative changes of the spine. Review of the MIP images confirms the above findings. CT ABDOMEN and PELVIS FINDINGS No intra-abdominal free air or free fluid. Hepatobiliary: The liver is unremarkable. No biliary dilatation. Cholecystectomy. Pancreas: Unremarkable. No pancreatic ductal dilatation or surrounding inflammatory changes. Spleen: Normal in size without focal abnormality. Adrenals/Urinary Tract: The right adrenal gland is unremarkable. Nodular thickening of the lateral limb of the left adrenal gland measuring 9 mm in thickness. Mild bilateral renal parenchyma atrophy. There is no hydronephrosis on either side. There is symmetric enhancement and excretion of contrast by both kidneys. The visualized ureters and urinary bladder appear unremarkable. Stomach/Bowel: There is postsurgical changes of the bowel with anastomotic staple line in the right lower quadrant. There is no bowel obstruction or active inflammation. Appendectomy. Vascular/Lymphatic: Moderate aortoiliac atherosclerotic disease. The IVC is unremarkable. 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 The main portal vein is patent. The left portal vein branch is not visualized and is likely occluded. The right branch of the portal vein is patent. No portal venous gas. There is no adenopathy. Reproductive: Hysterectomy.  No suspicious adnexal mass. Other: Midline vertical anterior abdominal wall incisional scar. Musculoskeletal: Degenerative changes of the spine. Grade 1  L4-L5 anterolisthesis. No acute osseous pathology. Review of the MIP images confirms the above findings. IMPRESSION: 1. Saddle embolus.  No CT evidence of right heart straining. 2. 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 3. Postsurgical changes of the bowel. No bowel obstruction. 4. No evidence of metastatic disease in the chest, abdomen, or pelvis. 5.  Aortic Atherosclerosis (ICD10-I70.0). These results were called by telephone at the time of interpretation on 11/06/2023 at 5:55 p.m. to provider DAVID YAO , who verbally acknowledged these results. Electronically Signed   By: Angus Bark M.D.   On: 11/06/2023 18:01   DG Chest 2 View Result Date: 11/06/2023 CLINICAL DATA:  Chest pain and shortness of breath.  Colon cancer. EXAM: CHEST - 2 VIEW COMPARISON:  Two-view chest x-ray 07/24/2017.  CT chest 08/02/2023 FINDINGS: Heart size is normal. Lungs are clear. No edema or effusion is present. No focal airspace disease present. Mild degenerative changes are noted in the thoracic spine. Cervical spine surgery noted. IMPRESSION: No acute cardiopulmonary disease. Electronically Signed   By: Audree Leas M.D.   On: 11/06/2023 14:43   DG ESOPHAGUS W DOUBLE CM (HD) Result Date: 10/18/2023 CLINICAL DATA:  Dysphagia.  Gastroesophageal reflux disease. EXAM: ESOPHAGUS/BARIUM SWALLOW/TABLET STUDY TECHNIQUE: Combined double and single contrast examination was performed using effervescent crystals, high-density barium, and thin liquid barium. FLUOROSCOPY: Radiation Exposure Index (as provided by the fluoroscopic device): 7.8 mGy Kerma COMPARISON:  Esophagram 10/30/2015 FINDINGS: Swallowing: Appears normal. No vestibular penetration or aspiration seen. Pharynx: Unremarkable. Esophagus: Normal appearance. Esophageal  motility: Mild esophageal dysmotility. No significant tertiary contractions. Hiatal Hernia: The previously shown  small sliding hiatal hernia was not readily apparent during today's examination. Gastroesophageal reflux: None visualized. Ingested 13 mm barium tablet: Passed normally. IMPRESSION: Mild esophageal dysmotility, otherwise unremarkable esophagram. Electronically Signed   By: Aundra Lee M.D.   On: 10/18/2023 14:39   Ms. Parr was interviewed and examined.  She reports feeling better.  She will be discharged to home today.  She will transition to apixaban anticoagulation.  Will confirm apixaban dosing with our pharmacy based on her age and creatinine clearance.  She is due to begin the next cycle of capecitabine  on 11/17/2023.  She will be seen for an office visit prior to beginning chemotherapy next week.  Anise Kerns, MD

## 2023-11-10 NOTE — Discharge Instructions (Signed)
 Information on my medicine - ELIQUIS (apixaban)  Why was Eliquis prescribed for you? 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  What do You need to know about Eliquis ? The starting dose is 10 mg (two 5 mg tablets) taken TWICE daily for the FIRST SEVEN (7) DAYS, then on 11/17/23 the dose is reduced to ONE 5 mg tablet taken TWICE daily.  Eliquis may be taken with or without food.   Try to take the dose about the same time in the morning and in the evening. If you have difficulty swallowing the tablet whole please discuss with your pharmacist how to take the medication safely.  Take Eliquis exactly as prescribed and DO NOT stop taking Eliquis without talking to the doctor who prescribed the medication.  Stopping may increase your risk of developing a new blood clot.  Refill your prescription before you run out.  After discharge, you should have regular check-up appointments with your healthcare provider that is prescribing your Eliquis.    What do you do if you miss a dose? If a dose of ELIQUIS is not taken at the scheduled time, take it as soon as possible on the same day and twice-daily administration should be resumed. The dose should not be doubled to make up for a missed dose.  Important Safety Information A possible side effect of Eliquis is bleeding. You should call your healthcare provider right away if you experience any of the following: Bleeding from an injury or your nose that does not stop. Unusual colored urine (red or dark brown) or unusual colored stools (red or black). Unusual bruising for unknown reasons. A serious fall or if you hit your head (even if there is no bleeding).  Some medicines may interact with Eliquis and might increase your risk of bleeding or clotting while on Eliquis. To help avoid this,  consult your healthcare provider or pharmacist prior to using any new prescription or non-prescription medications, including herbals, vitamins, non-steroidal anti-inflammatory drugs (NSAIDs) and supplements.  This website has more information on Eliquis (apixaban): http://[email or web address].[email or web address]

## 2023-11-10 NOTE — Discharge Summary (Addendum)
 Physician Discharge Summary   Patient: Adrienne Rogers MRN: [id number] DOB: 1941/06/28  Admit date:     11/06/2023  Discharge date: 11/10/23  Discharge Physician: Aisha Hove   PCP: Dondra Fuel, MD   Recommendations at discharge:   PCP follow up in 1 week. Oncology follow up with Dr. Scherrie Curt as instructed.  Discharge Diagnoses: Principal Problem:   Pulmonary embolism (HCC) Active Problems:   Colon cancer (HCC)   COPD (chronic obstructive pulmonary disease) (HCC)   Depressive disorder   Essential hypertension   Gastroesophageal reflux disease   Hypersomnia with sleep apnea   Stage 3b chronic kidney disease (HCC)   Thrombocytopenia (HCC)   Hyperlipidemia   Superior mesenteric vein thrombosis (HCC)   Obesity (BMI 30-39.9)   Chronic anemia   Left femoral vein DVT (HCC)  Resolved Problems:   * No resolved hospital problems. [facility] Course: Patient is a 83 year old with history of HTN, HLD, GERD and colon cancer who was on oral chemotherapeutics who presents with a 2-week history of weakness, shortness of breath, dizziness.  Patient has been on oral chemo and thought her symptoms might be only related to that but her shortness of breath became worse with just minimal exertion and she finally decided to come in and have it checked out. Patient has a history of COPD from remote smoking history and is on multiple inhalers at home. In the ED patient had a negative respiratory panel negative troponins and a CT that showed a saddle embolism.  CT of the abdomen also showed nonocclusive clot in the superior mesenteric vein. The patient was started on heparin  drip and we were asked to admit.  Assessment and Plan: * Acute saddle pulmonary embolism (HCC) SMV, left portal vein thrombosis. Left femoral, popliteal chronic DVT Heparin  drip given for 3-4 days per pharmacy protocol and now transitioned to Eliquis  (script sent to pharmacy) Discussed with vascular surgery, no  acute intervention needed. If hemodynamic unstable advised to call IR. Echo with EF 65%, grade 1 diastolic dysfunction. Lower extremity doppler showed left femoral, popliteal chronic DVT. Understands risks of being on anticoagulation. She refused home health services. Advised to follow up with PCP, oncology upon discharge, she understands and agrees.   Hyperlipidemia Continue Zocor .   Thrombocytopenia (HCC) Platelets improved.   Stage 3b chronic kidney disease (HCC) Stable. Creatinine seems stable around 1.3 Hold nephrotoxic agents. Monitor renal function as outpatient.   Anemia of chronic disease. Hb stable. Discussed about risks of bleeding while on Eliquis .   Hypersomnia with sleep apnea Continue CPAP at at bedtime   Gastroesophageal reflux disease Continue PPI daily.   Essential hypertension Continue Coreg , spironolactone    Depressive disorder Continue Zoloft  and trazodone    COPD (chronic obstructive pulmonary disease) (HCC) Continue preventative inhalers Continue albuterol  for rescue   Invasive Colon cancer (HCC) On cycle 3 of chemo since last month.  Dr. Scherrie Curt follow up as outpatient.   Obesity Class 1. BMI 31.05- Diet, exercise and weight reduction advised.       Consultants: Oncology Procedures performed: none  Disposition: Home Diet recommendation:  Discharge Diet Orders (From admission, onward)     Start     Ordered   11/10/23 0000  Diet - low sodium heart healthy        11/10/23 1225           Cardiac diet DISCHARGE MEDICATION: Allergies as of 11/10/2023       Reactions   Codeine Itching, Rash, Other (See Comments)  Lisinopril Other (See Comments)   Tinnitus   Ezetimibe-simvastatin  Other (See Comments)   Muscle Pain   Fosamax  [alendronate Sodium] Other (See Comments)   Heartburn   Sulfa Antibiotics Hives, Itching, Rash, Other (See Comments)   ITCHING,WATERING EYES   Bupropion  Other (See Comments)   vertigo   Doxepin  Swelling   Prazosin Other (See Comments)   Low blood pressure   Tetracyclines & Related Other (See Comments), Nausea And Vomiting   Swelling eruption of the lips        Medication List     TAKE these medications    acetaminophen  500 MG tablet Commonly known as: TYLENOL  Take 2 tablets (1,000 mg total) by mouth every 8 (eight) hours as needed for mild pain (pain score 1-3).   albuterol  108 (90 Base) MCG/ACT inhaler Commonly known as: VENTOLIN  HFA Inhale 2 puffs into the lungs every 6 (six) hours as needed for wheezing or shortness of breath.   capecitabine  500 MG tablet Commonly known as: XELODA  Take 3 tablets (1,500 mg total) by mouth 2 (two) times daily after a meal. Take for 14 days, then hold for 7 days. Repeat every 21 days. Start cycle on 11/10/2023 What changed:  how much to take when to take this additional instructions   carvedilol  25 MG tablet Commonly known as: COREG  Take 0.5 tablets (12.5 mg total) by mouth 2 (two) times daily.   Cholecalciferol  25 MCG (1000 UT) tablet Take 1,000 Units by mouth daily.   docusate sodium  100 MG capsule Commonly known as: COLACE Take 1 capsule (100 mg total) by mouth 2 (two) times daily as needed for mild constipation.   Eliquis  DVT/PE Starter Pack Generic drug: Apixaban  Starter Pack (10mg  and 5mg ) Take as directed on package: start with two-5mg  tablets twice daily for 7 days. On day 8, switch to one-5mg  tablet twice daily.   ferrous sulfate  325 (65 FE) MG tablet Take 325 mg by mouth daily with breakfast.   ketotifen  0.035 % ophthalmic solution Commonly known as: ZADITOR  Place 1 drop into both eyes in the morning and at bedtime.   methocarbamol  500 MG tablet Commonly known as: ROBAXIN  Take 1 tablet (500 mg total) by mouth every 6 (six) hours as needed for muscle spasms.   pantoprazole  40 MG tablet Commonly known as: PROTONIX  Take 40 mg by mouth 2 (two) times daily before a meal.   polyethylene glycol 17 g  packet Commonly known as: MIRALAX  / GLYCOLAX  Take 17 g by mouth 2 (two) times daily as needed. What changed: reasons to take this   sertraline  100 MG tablet Commonly known as: ZOLOFT  Take 200 mg by mouth daily.   simvastatin  40 MG tablet Commonly known as: ZOCOR  Take 40 mg by mouth at bedtime.   spironolactone  25 MG tablet Commonly known as: ALDACTONE  Take 25 mg by mouth daily.   Tiotropium Bromide  Monohydrate 2.5 MCG/ACT Aers Take 2 puffs by mouth daily.   traMADol  50 MG tablet Commonly known as: ULTRAM  Take 1 tablet (50 mg total) by mouth every 6 (six) hours as needed.   traZODone  100 MG tablet Commonly known as: DESYREL  Take 100 mg by mouth at bedtime.   Wixela Inhub 250-50 MCG/ACT Aepb Generic drug: fluticasone -salmeterol Inhale 1 puff into the lungs 2 (two) times daily.        Follow-up Information     Leanna Promise T, MD Follow up in 1 week(s).   Specialty: Internal Medicine Contact information: [phone] [place]  [address] [phone]         Sumner Ends, MD Follow up in 1 week(s).   Specialty: Oncology Contact information: [address] [place] [address] [phone]                Discharge Exam: Cleavon Curls Weights   11/06/23 1337  Weight: 77 kg      11/10/2023    5:43 AM 11/09/2023    8:04 PM 11/09/2023   12:23 PM  Vitals with BMI  Systolic 132 145 956  Diastolic 72 76 68  Pulse 65 58 60   General - Elderly obese African American female, no apparent distress HEENT - PERRLA, EOMI, atraumatic head, non tender sinuses. Lung - Clear, basal rales, no rhonchi, wheezes. Heart - S1, S2 heard, no murmurs, rubs, trace pedal edema. Abdomen - Soft, non tender, bowel sounds good Neuro - Alert, awake and oriented x 3, non focal exam. Skin - Warm and dry.  Condition at discharge: stable  The results of significant diagnostics from this hospitalization (including imaging, microbiology, ancillary and  laboratory) are listed below for reference.   Imaging Studies: VAS US  LOWER EXTREMITY VENOUS (DVT) Result Date: 11/09/2023  Lower Venous DVT Study Patient Name:  CATHLYN SPADACCINI  Date of Exam:   11/09/2023 Medical Rec #: [id number]           Accession #:    [id number] Date of Birth: 03-17-1941          Patient Gender: F Patient Age:   59 years Exam Location:  [facility] Procedure:      VAS US  LOWER EXTREMITY VENOUS (DVT) Referring Phys: Aisha Hove --------------------------------------------------------------------------------  Indications: Pulmonary embolism.  Risk Factors: Hx of PE. Comparison Study: Novel thrombus seen since previous exam 10/27/11. Performing Technologist: Estanislao Heimlich  Examination Guidelines: A complete evaluation includes B-mode imaging, spectral Doppler, color Doppler, and power Doppler as needed of all accessible portions of each vessel. Bilateral testing is considered an integral part of a complete examination. Limited examinations for reoccurring indications may be performed as noted. The reflux portion of the exam is performed with the patient in reverse Trendelenburg.  +---------+---------------+---------+-----------+----------+--------------+ RIGHT    CompressibilityPhasicitySpontaneityPropertiesThrombus Aging +---------+---------------+---------+-----------+----------+--------------+ CFV      Full           Yes      Yes                                 +---------+---------------+---------+-----------+----------+--------------+ SFJ      Full                                                        +---------+---------------+---------+-----------+----------+--------------+ FV Prox  Full                                                        +---------+---------------+---------+-----------+----------+--------------+ FV Mid   Full                                                         +---------+---------------+---------+-----------+----------+--------------+  FV DistalFull                                                        +---------+---------------+---------+-----------+----------+--------------+ PFV      Full                                                        +---------+---------------+---------+-----------+----------+--------------+ POP      Full           Yes      Yes                                 +---------+---------------+---------+-----------+----------+--------------+ PTV      Full                    Yes                                 +---------+---------------+---------+-----------+----------+--------------+ PERO     Full                    Yes                                 +---------+---------------+---------+-----------+----------+--------------+   +--------+---------------+---------+-----------+-----------------+-------------+ LEFT    CompressibilityPhasicitySpontaneityProperties       Thrombus                                                                  Aging         +--------+---------------+---------+-----------+-----------------+-------------+ CFV     Full           Yes      Yes                                       +--------+---------------+---------+-----------+-----------------+-------------+ SFJ     Full                                                              +--------+---------------+---------+-----------+-----------------+-------------+ FV Prox Full                                                              +--------+---------------+---------+-----------+-----------------+-------------+ FV Mid  Full                                                              +--------+---------------+---------+-----------+-----------------+-------------+  FV      Partial                 Yes                                       Distal                                                                     +--------+---------------+---------+-----------+-----------------+-------------+ PFV     Full                                                              +--------+---------------+---------+-----------+-----------------+-------------+ POP     None           No       No         spongy           Chronic                                                  w/compression                  +--------+---------------+---------+-----------+-----------------+-------------+ PTV     Full                               softly echogenic Chronic       +--------+---------------+---------+-----------+-----------------+-------------+ PERO    Full                                                              +--------+---------------+---------+-----------+-----------------+-------------+     Summary: RIGHT: - There is no evidence of deep vein thrombosis in the lower extremity.  - No cystic structure found in the popliteal fossa.  LEFT: - Findings consistent with chronic deep vein thrombosis involving the left femoral vein, and left popliteal vein.  - No cystic structure found in the popliteal fossa.  *See table(s) above for measurements and observations. Electronically signed by Genny Kid MD on 11/09/2023 at 5:31:18 PM.    Final    ECHOCARDIOGRAM COMPLETE Result Date: 11/08/2023    ECHOCARDIOGRAM REPORT   Patient Name:   Adrienne Rogers Date of Exam: 11/08/2023 Medical Rec #:  [id number]          Height:       62.0 in Accession #:    [id number]         Weight:       169.8 lb Date of Birth:  09-15-1940         BSA:  1.783 m Patient Age:    82 years           BP:           152/77 mmHg Patient Gender: F                  HR:           63 bpm. Exam Location:  Inpatient Procedure: 2D Echo, Cardiac Doppler and Color Doppler (Both Spectral and Color            Flow Doppler were utilized during procedure). Indications:    Pulmonary Embolus I26.09  History:        Patient has no  prior history of Echocardiogram examinations.                 COPD; Risk Factors:Dyslipidemia and Hypertension.  Sonographer:    Hersey Lorenzo Referring Phys: [id number] Aisha Hove IMPRESSIONS  1. Left ventricular ejection fraction, by estimation, is 60 to 65%. The left ventricle has normal function. The left ventricle has no regional wall motion abnormalities. There is mild left ventricular hypertrophy. Left ventricular diastolic parameters are consistent with Grade I diastolic dysfunction (impaired relaxation).  2. Right ventricular systolic function is normal. The right ventricular size is normal.  3. Left atrial size was moderately dilated.  4. The mitral valve is normal in structure. Mild mitral valve regurgitation. No evidence of mitral stenosis.  5. Tricuspid valve regurgitation is moderate.  6. The aortic valve is tricuspid. Aortic valve regurgitation is not visualized. No aortic stenosis is present.  7. The inferior vena cava is normal in size with greater than 50% respiratory variability, suggesting right atrial pressure of 3 mmHg. FINDINGS  Left Ventricle: Left ventricular ejection fraction, by estimation, is 60 to 65%. The left ventricle has normal function. The left ventricle has no regional wall motion abnormalities. The left ventricular internal cavity size was normal in size. There is  mild left ventricular hypertrophy. Left ventricular diastolic parameters are consistent with Grade I diastolic dysfunction (impaired relaxation). Right Ventricle: The right ventricular size is normal. No increase in right ventricular wall thickness. Right ventricular systolic function is normal. Left Atrium: Left atrial size was moderately dilated. Right Atrium: Right atrial size was normal in size. Pericardium: There is no evidence of pericardial effusion. Mitral Valve: The mitral valve is normal in structure. Mild mitral valve regurgitation. No evidence of mitral valve stenosis. Tricuspid Valve: The tricuspid  valve is normal in structure. Tricuspid valve regurgitation is moderate . No evidence of tricuspid stenosis. Aortic Valve: The aortic valve is tricuspid. Aortic valve regurgitation is not visualized. No aortic stenosis is present. Pulmonic Valve: The pulmonic valve was normal in structure. Pulmonic valve regurgitation is mild to moderate. No evidence of pulmonic stenosis. Aorta: The aortic root is normal in size and structure. Venous: The inferior vena cava is normal in size with greater than 50% respiratory variability, suggesting right atrial pressure of 3 mmHg. IAS/Shunts: No atrial level shunt detected by color flow Doppler.  LEFT VENTRICLE PLAX 2D LVIDd:         4.80 cm   Diastology LVIDs:         2.80 cm   LV e' medial:    5.44 cm/s LV PW:         1.30 cm   LV E/e' medial:  12.4 LV IVS:        1.30 cm   LV e' lateral:   6.64 cm/s LVOT diam:  1.90 cm   LV E/e' lateral: 10.2 LV SV:         72 LV SV Index:   40 LVOT Area:     2.84 cm  RIGHT VENTRICLE            IVC RV S prime:     8.16 cm/s  IVC diam: 1.80 cm TAPSE (M-mode): 2.0 cm LEFT ATRIUM             Index        RIGHT ATRIUM           Index LA diam:        4.00 cm 2.24 cm/m   RA Area:     14.00 cm LA Vol (A2C):   56.4 ml 31.63 ml/m  RA Volume:   32.70 ml  18.34 ml/m LA Vol (A4C):   77.7 ml 43.58 ml/m LA Biplane Vol: 70.4 ml 39.48 ml/m  AORTIC VALVE LVOT Vmax:   113.00 cm/s LVOT Vmean:  74.500 cm/s LVOT VTI:    0.253 m  AORTA Ao Root diam: 3.00 cm Ao Asc diam:  3.00 cm MITRAL VALVE               TRICUSPID VALVE MV Area (PHT): 3.76 cm    TR Peak grad:   39.7 mmHg MV Decel Time: 202 msec    TR Vmax:        315.00 cm/s MV E velocity: 67.70 cm/s MV A velocity: 87.80 cm/s  SHUNTS MV E/A ratio:  0.77        Systemic VTI:  0.25 m                            Systemic Diam: 1.90 cm Dorothye Gathers MD Electronically signed by Dorothye Gathers MD Signature Date/Time: 11/08/2023/1:19:52 PM    Final    CT Angio Chest PE W and/or Wo Contrast Result Date:  11/06/2023 CLINICAL DATA:  Colon cancer staging. EXAM: CT ANGIOGRAPHY CHEST CT ABDOMEN AND PELVIS WITH CONTRAST TECHNIQUE: Multidetector CT imaging of the chest was performed using the standard protocol during bolus administration of intravenous contrast. Multiplanar CT image reconstructions and MIPs were obtained to evaluate the vascular anatomy. Multidetector CT imaging of the abdomen and pelvis was performed using the standard protocol during bolus administration of intravenous contrast. RADIATION DOSE REDUCTION: This exam was performed according to the departmental dose-optimization program which includes automated exposure control, adjustment of the mA and/or kV according to patient size and/or use of iterative reconstruction technique. CONTRAST:  80mL OMNIPAQUE  IOHEXOL  350 MG/ML SOLN COMPARISON:  Chest radiograph dated 11/06/2023. chest CT dated 08/02/2023 and CT abdomen pelvis dated 07/22/2023. FINDINGS: CTA CHEST FINDINGS Cardiovascular: There is borderline cardiomegaly. Small pericardial effusion measuring 6 mm in thickness. The thoracic aorta is unremarkable. There is a saddle embolus straddling the bifurcation of the pulmonary trunk and extending into the central pulmonary arteries and bilateral upper and lower lobar branches. No CT evidence of right heart straining. Mediastinum/Nodes: No hilar or mediastinal adenopathy. The esophagus and the thyroid gland are grossly unremarkable. No mediastinal fluid collection. Lungs/Pleura: Minimal bibasilar subpleural atelectasis. No focal consolidation, pleural effusion or pneumothorax. The central airways are patent. Musculoskeletal: No acute osseous pathology. Degenerative changes of the spine. Review of the MIP images confirms the above findings. CT ABDOMEN and PELVIS FINDINGS No intra-abdominal free air or free fluid. Hepatobiliary: The liver is unremarkable. No biliary dilatation. Cholecystectomy. Pancreas: Unremarkable. No pancreatic ductal dilatation  or  surrounding inflammatory changes. Spleen: Normal in size without focal abnormality. Adrenals/Urinary Tract: The right adrenal gland is unremarkable. Nodular thickening of the lateral limb of the left adrenal gland measuring 9 mm in thickness. Mild bilateral renal parenchyma atrophy. There is no hydronephrosis on either side. There is symmetric enhancement and excretion of contrast by both kidneys. The visualized ureters and urinary bladder appear unremarkable. Stomach/Bowel: There is postsurgical changes of the bowel with anastomotic staple line in the right lower quadrant. There is no bowel obstruction or active inflammation. Appendectomy. Vascular/Lymphatic: Moderate aortoiliac atherosclerotic disease. The IVC is unremarkable. 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 The main portal vein is patent. The left portal vein branch is not visualized and is likely occluded. The right branch of the portal vein is patent. No portal venous gas. There is no adenopathy. Reproductive: Hysterectomy.  No suspicious adnexal mass. Other: Midline vertical anterior abdominal wall incisional scar. Musculoskeletal: Degenerative changes of the spine. Grade 1 L4-L5 anterolisthesis. No acute osseous pathology. Review of the MIP images confirms the above findings. IMPRESSION: 1. Saddle embolus.  No CT evidence of right heart straining. 2. 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 3. Postsurgical changes of the bowel. No bowel obstruction. 4. No evidence of metastatic disease in the chest, abdomen, or pelvis. 5.  Aortic Atherosclerosis (ICD10-I70.0). These results were called by telephone at the time of interpretation on 11/06/2023 at 5:55 p.m. to provider DAVID YAO , who verbally acknowledged these results. Electronically Signed   By: Angus Bark M.D.   On: 11/06/2023 18:01   CT  ABDOMEN PELVIS W CONTRAST Result Date: 11/06/2023 CLINICAL DATA:  Colon cancer staging. EXAM: CT ANGIOGRAPHY CHEST CT ABDOMEN AND PELVIS WITH CONTRAST TECHNIQUE: Multidetector CT imaging of the chest was performed using the standard protocol during bolus administration of intravenous contrast. Multiplanar CT image reconstructions and MIPs were obtained to evaluate the vascular anatomy. Multidetector CT imaging of the abdomen and pelvis was performed using the standard protocol during bolus administration of intravenous contrast. RADIATION DOSE REDUCTION: This exam was performed according to the departmental dose-optimization program which includes automated exposure control, adjustment of the mA and/or kV according to patient size and/or use of iterative reconstruction technique. CONTRAST:  80mL OMNIPAQUE  IOHEXOL  350 MG/ML SOLN COMPARISON:  Chest radiograph dated 11/06/2023. chest CT dated 08/02/2023 and CT abdomen pelvis dated 07/22/2023. FINDINGS: CTA CHEST FINDINGS Cardiovascular: There is borderline cardiomegaly. Small pericardial effusion measuring 6 mm in thickness. The thoracic aorta is unremarkable. There is a saddle embolus straddling the bifurcation of the pulmonary trunk and extending into the central pulmonary arteries and bilateral upper and lower lobar branches. No CT evidence of right heart straining. Mediastinum/Nodes: No hilar or mediastinal adenopathy. The esophagus and the thyroid gland are grossly unremarkable. No mediastinal fluid collection. Lungs/Pleura: Minimal bibasilar subpleural atelectasis. No focal consolidation, pleural effusion or pneumothorax. The central airways are patent. Musculoskeletal: No acute osseous pathology. Degenerative changes of the spine. Review of the MIP images confirms the above findings. CT ABDOMEN and PELVIS FINDINGS No intra-abdominal free air or free fluid. Hepatobiliary: The liver is unremarkable. No biliary dilatation. Cholecystectomy. Pancreas: Unremarkable.  No pancreatic ductal dilatation or surrounding inflammatory changes. Spleen: Normal in size without focal abnormality. Adrenals/Urinary Tract: The right adrenal gland is unremarkable. Nodular thickening of the lateral limb of the left adrenal gland  measuring 9 mm in thickness. Mild bilateral renal parenchyma atrophy. There is no hydronephrosis on either side. There is symmetric enhancement and excretion of contrast by both kidneys. The visualized ureters and urinary bladder appear unremarkable. Stomach/Bowel: There is postsurgical changes of the bowel with anastomotic staple line in the right lower quadrant. There is no bowel obstruction or active inflammation. Appendectomy. Vascular/Lymphatic: Moderate aortoiliac atherosclerotic disease. The IVC is unremarkable. 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 The main portal vein is patent. The left portal vein branch is not visualized and is likely occluded. The right branch of the portal vein is patent. No portal venous gas. There is no adenopathy. Reproductive: Hysterectomy.  No suspicious adnexal mass. Other: Midline vertical anterior abdominal wall incisional scar. Musculoskeletal: Degenerative changes of the spine. Grade 1 L4-L5 anterolisthesis. No acute osseous pathology. Review of the MIP images confirms the above findings. IMPRESSION: 1. Saddle embolus.  No CT evidence of right heart straining. 2. 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 3. Postsurgical changes of the bowel. No bowel obstruction. 4. No evidence of metastatic disease in the chest, abdomen, or pelvis. 5.  Aortic Atherosclerosis (ICD10-I70.0). These results were called by telephone at the time of interpretation on 11/06/2023 at 5:55 p.m. to provider DAVID YAO , who verbally acknowledged these results. Electronically Signed   By: Angus Bark M.D.    On: 11/06/2023 18:01   DG Chest 2 View Result Date: 11/06/2023 CLINICAL DATA:  Chest pain and shortness of breath.  Colon cancer. EXAM: CHEST - 2 VIEW COMPARISON:  Two-view chest x-ray 07/24/2017.  CT chest 08/02/2023 FINDINGS: Heart size is normal. Lungs are clear. No edema or effusion is present. No focal airspace disease present. Mild degenerative changes are noted in the thoracic spine. Cervical spine surgery noted. IMPRESSION: No acute cardiopulmonary disease. Electronically Signed   By: Audree Leas M.D.   On: 11/06/2023 14:43   DG ESOPHAGUS W DOUBLE CM (HD) Result Date: 10/18/2023 CLINICAL DATA:  Dysphagia.  Gastroesophageal reflux disease. EXAM: ESOPHAGUS/BARIUM SWALLOW/TABLET STUDY TECHNIQUE: Combined double and single contrast examination was performed using effervescent crystals, high-density barium, and thin liquid barium. FLUOROSCOPY: Radiation Exposure Index (as provided by the fluoroscopic device): 7.8 mGy Kerma COMPARISON:  Esophagram 10/30/2015 FINDINGS: Swallowing: Appears normal. No vestibular penetration or aspiration seen. Pharynx: Unremarkable. Esophagus: Normal appearance. Esophageal motility: Mild esophageal dysmotility. No significant tertiary contractions. Hiatal Hernia: The previously shown small sliding hiatal hernia was not readily apparent during today's examination. Gastroesophageal reflux: None visualized. Ingested 13 mm barium tablet: Passed normally. IMPRESSION: Mild esophageal dysmotility, otherwise unremarkable esophagram. Electronically Signed   By: Aundra Lee M.D.   On: 10/18/2023 14:39    Microbiology: Results for orders placed or performed during the hospital encounter of 11/06/23  Resp panel by RT-PCR (RSV, Flu A&B, Covid) Anterior Nasal Swab     Status: None   Collection Time: 11/06/23  3:07 PM   Specimen: Anterior Nasal Swab  Result Value Ref Range Status   SARS Coronavirus 2 by RT PCR NEGATIVE NEGATIVE Final    Comment: (NOTE) SARS-CoV-2 target  nucleic acids are NOT DETECTED.  The SARS-CoV-2 RNA is generally detectable in upper respiratory specimens during the acute phase of infection. The lowest concentration of SARS-CoV-2 viral copies this assay can detect is 138 copies/mL. A negative result does not preclude SARS-Cov-2 infection and  should not be used as the sole basis for treatment or other patient management decisions. A negative result may occur with  improper specimen collection/handling, submission of specimen other than nasopharyngeal swab, presence of viral mutation(s) within the areas targeted by this assay, and inadequate number of viral copies(<138 copies/mL). A negative result must be combined with clinical observations, patient history, and epidemiological information. The expected result is Negative.  Fact Sheet for Patients:  BloggerCourse.com  Fact Sheet for Healthcare Providers:  SeriousBroker.it  This test is no t yet approved or cleared by the [place]  FDA and  has been authorized for detection and/or diagnosis of SARS-CoV-2 by FDA under an Emergency Use Authorization (EUA). This EUA will remain  in effect (meaning this test can be used) for the duration of the COVID-19 declaration under Section 564(b)(1) of the Act, 21 U.S.C.section 360bbb-3(b)(1), unless the authorization is terminated  or revoked sooner.       Influenza A by PCR NEGATIVE NEGATIVE Final   Influenza B by PCR NEGATIVE NEGATIVE Final    Comment: (NOTE) The Xpert Xpress SARS-CoV-2/FLU/RSV plus assay is intended as an aid in the diagnosis of influenza from Nasopharyngeal swab specimens and should not be used as a sole basis for treatment. Nasal washings and aspirates are unacceptable for Xpert Xpress SARS-CoV-2/FLU/RSV testing.  Fact Sheet for Patients: BloggerCourse.com  Fact Sheet for Healthcare  Providers: SeriousBroker.it  This test is not yet approved or cleared by the [place]  FDA and has been authorized for detection and/or diagnosis of SARS-CoV-2 by FDA under an Emergency Use Authorization (EUA). This EUA will remain in effect (meaning this test can be used) for the duration of the COVID-19 declaration under Section 564(b)(1) of the Act, 21 U.S.C. section 360bbb-3(b)(1), unless the authorization is terminated or revoked.     Resp Syncytial Virus by PCR NEGATIVE NEGATIVE Final    Comment: (NOTE) Fact Sheet for Patients: BloggerCourse.com  Fact Sheet for Healthcare Providers: SeriousBroker.it  This test is not yet approved or cleared by the [place]  FDA and has been authorized for detection and/or diagnosis of SARS-CoV-2 by FDA under an Emergency Use Authorization (EUA). This EUA will remain in effect (meaning this test can be used) for the duration of the COVID-19 declaration under Section 564(b)(1) of the Act, 21 U.S.C. section 360bbb-3(b)(1), unless the authorization is terminated or revoked.  Performed at [facility], [address] [place], [place] [address]     Labs: CBC: Recent Labs  Lab 11/06/23 1404 11/07/23 0326 11/08/23 0536 11/09/23 0535 11/10/23 0520  WBC 5.6 5.8 5.5 5.3 5.0  HGB 11.0* 10.1* 9.9* 10.9* 10.6*  HCT 33.1* 30.1* 31.0* 33.8* 32.8*  MCV 91.7 91.5 95.7 94.7 94.3  PLT 133* 129* 151 163 174   Basic Metabolic Panel: Recent Labs  Lab 11/06/23 1404 11/07/23 0326 11/08/23 0536 11/09/23 0535 11/10/23 0520  NA 140 141 139 137 139  K 4.2 3.7 4.1 3.9 3.9  CL 108 111 109 106 106  CO2 24 23 23 23 23   GLUCOSE 126* 115* 99 97 95  BUN 32* 26* 24* 22 27*  CREATININE 1.56* 1.27* 1.30* 1.26* 1.50*  CALCIUM  9.6 9.5 9.4 9.6 9.4   Liver Function Tests: Recent Labs  Lab 11/09/23 1436  AST 14*  ALT 13  ALKPHOS 76  BILITOT 0.8   PROT 6.5  ALBUMIN 3.7   CBG: No results for input(s): "GLUCAP" in the last 168 hours.  Discharge time spent: 36 minutes.  Signed: Sundra Engel  Butch Cashing, MD [facility] 11/11/2023

## 2023-11-10 NOTE — Progress Notes (Signed)
 Assumed care of patient from off going nurse. Agree with previous nurse's assessment. Patient in NAD. Call light within reach. Bed alarm engaged.

## 2023-11-10 NOTE — Progress Notes (Signed)
 Physical Therapy Treatment Patient Details Name: Adrienne Rogers MRN: [id number] DOB: [birth year] Today's Date: 11/10/2023   History of Present Illness 83 yo female admitted with PE. Hx of ex lap, colectomy, R TKA, anxiety, colon ca, ckd, obesity, vertigo, PTSD    PT Comments  Pt agreeable to session, initially able to reach hallway and then reports light headedness, returns to chair BP assessed 111/71, HR 63, likely lower before sitting in chair. Pt reports feeling better and wants to try again. Pt able to amb x124ft with 2WW remains asymptomatic. Upon returning to recliner, BP assessed: 89/58, 64 bpm. Recheck- 104/66, 66 bpm. Nursing notified.     If plan is discharge home, recommend the following: A little help with walking and/or transfers;A little help with bathing/dressing/bathroom;Assistance with cooking/housework;Assist for transportation   Can travel by private vehicle        Equipment Recommendations  None recommended by PT    Recommendations for Other Services       Precautions / Restrictions Precautions Precautions: Fall Recall of Precautions/Restrictions: Intact Restrictions Weight Bearing Restrictions Per Provider Order: No     Mobility  Bed Mobility Overal bed mobility: Modified Independent Bed Mobility: Supine to Sit     Supine to sit: Modified independent (Device/Increase time)          Transfers Overall transfer level: Modified independent Equipment used: Rolling walker (2 wheels) Transfers: Sit to/from Stand Sit to Stand: Supervision, Modified independent (Device/Increase time)                Ambulation/Gait Ambulation/Gait assistance: Supervision Gait Distance (Feet): 165 Feet Assistive device: Rolling walker (2 wheels) Gait Pattern/deviations: Step-through pattern, Decreased stride length Gait velocity: dec     General Gait Details: Pt demos minimal UE reliance on walker, able to navigate hallway and returns to room. Initially pt  reports light headedness but is able to recover after sitting and reattempts without symptoms   Stairs             Wheelchair Mobility     Tilt Bed    Modified Rankin (Stroke Patients Only)       Balance Overall balance assessment: Needs assistance   Sitting balance-Leahy Scale: Good     Standing balance support: During functional activity, No upper extremity supported Standing balance-Leahy Scale: Good                              Communication Communication Communication: No apparent difficulties  Cognition Arousal: Alert Behavior During Therapy: WFL for tasks assessed/performed   PT - Cognitive impairments: No apparent impairments                         Following commands: Intact      Cueing Cueing Techniques: Verbal cues  Exercises      General Comments General comments (skin integrity, edema, etc.): hypotensive      Pertinent Vitals/Pain Pain Assessment Pain Assessment: No/denies pain    Home Living                          Prior Function            PT Goals (current goals can now be found in the care plan section) Acute Rehab PT Goals Patient Stated Goal: get back home to her dog PT Goal Formulation: With patient Time For Goal Achievement: 11/23/23 Potential to Achieve Goals:  Good Progress towards PT goals: Progressing toward goals    Frequency    Min 3X/week      PT Plan      Co-evaluation              AM-PAC PT "6 Clicks" Mobility   Outcome Measure  Help needed turning from your back to your side while in a flat bed without using bedrails?: None Help needed moving from lying on your back to sitting on the side of a flat bed without using bedrails?: None Help needed moving to and from a bed to a chair (including a wheelchair)?: None Help needed standing up from a chair using your arms (e.g., wheelchair or bedside chair)?: None Help needed to walk in hospital room?: A Little Help  needed climbing 3-5 steps with a railing? : A Little 6 Click Score: 22    End of Session Equipment Utilized During Treatment: Gait belt Activity Tolerance: Patient tolerated treatment well Patient left: in chair;with call bell/phone within reach;with chair alarm set Nurse Communication: Mobility status PT Visit Diagnosis: Difficulty in walking, not elsewhere classified (R26.2);Muscle weakness (generalized) (M62.81)     Time: 1610-9604 PT Time Calculation (min) (ACUTE ONLY): 17 min  Charges:    $Gait Training: 8-22 mins PT General Charges $$ ACUTE PT VISIT: 1 Visit                     Darien Eden, PT Acute Rehabilitation Services Office: [phone] 11/10/2023    Serafin Dames 11/10/2023, 12:19 PM

## 2023-11-10 NOTE — Progress Notes (Signed)
 PHARMACY - ANTICOAGULATION CONSULT NOTE  Pharmacy Consult for Eliquis Indication: pulmonary embolus, DVT  Allergies  Allergen Reactions   Codeine Itching, Rash and Other (See Comments)   Lisinopril Other (See Comments)    Tinnitus   Ezetimibe-Simvastatin  Other (See Comments)    Muscle Pain   Fosamax  [Alendronate Sodium] Other (See Comments)    Heartburn   Sulfa Antibiotics Hives, Itching, Rash and Other (See Comments)    ITCHING,WATERING EYES   Bupropion  Other (See Comments)    vertigo   Doxepin Swelling   Prazosin Other (See Comments)    Low blood pressure   Tetracyclines & Related Other (See Comments) and Nausea And Vomiting    Swelling eruption of the lips   Patient Measurements: Height: 5\' 2"  (157.5 cm) Weight: 77 kg (169 lb 12.1 oz) IBW/kg (Calculated) : 50.1 HEPARIN DW (KG): 66.9  Vital Signs: Temp: 97.8 F (36.6 C) (05/08 0543) BP: 132/72 (05/08 0543) Pulse Rate: 65 (05/08 0543)  Labs: Recent Labs    11/08/23 0536 11/09/23 0535 11/09/23 1436 11/10/23 0520  HGB 9.9* 10.9*  --  10.6*  HCT 31.0* 33.8*  --  32.8*  PLT 151 163  --  174  HEPARINUNFRC 0.70 0.79* 0.40 0.28*  CREATININE 1.30* 1.26*  --  1.50*    Estimated Creatinine Clearance: 28.3 mL/min (A) (by C-G formula based on SCr of 1.5 mg/dL (H)).   Medical History: Past Medical History:  Diagnosis Date   Asthma    COPD (chronic obstructive pulmonary disease) (HCC)    Depression    Hyperlipidemia    Hypertension    PTSD (post-traumatic stress disorder)     Assessment: 83 year old female presented with shortness of breath, imaging with saddle embolus as well as nonocclusive thrombus in the SMV. 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 Pharmacy consulted to start heparin infusion, which she was maintained on for approximately 4 days.   Baseline CBC: Hgb 11, Plt 133. Not on anticoagulation prior to admission.  Pharmacy now consulted to transition to Eliquis.   Plan:  -Stop heparin infusion with  first dose of Eliquis -Start Eliquis 10 mg BID x 7 days followed by 5 mg BID -Pharmacy to provide education prior to discharge   Lolita Rise, PharmD, BCPS Clinical Pharmacist 11/10/2023 8:14 AM

## 2023-11-10 NOTE — TOC Transition Note (Signed)
 Transition of Care [facility]) - Discharge Note  Patient Details  Name: Adrienne Rogers MRN: [id number] Date of Birth: 1941/01/03  Transition of Care [facility]) CM/SW Contact:  Zenon Hilda, LCSW Phone Number: 11/10/2023, 1:12 PM  Clinical Narrative: PT evaluation recommended HH. CSW followed up with patient regarding recommendation. Patient politely declined HH at this time. TOC signing off.  Final next level of care: Home/Self Care Barriers to Discharge: Barriers Resolved  Patient Goals and CMS Choice Choice offered to / list presented to : NA  Discharge Plan and Services Additional resources added to the After Visit Summary for       DME Arranged: N/A DME Agency: NA HH Arranged: Refused HH  Social Drivers of Health (SDOH) Interventions SDOH Screenings   Food Insecurity: No Food Insecurity (11/06/2023)  Housing: Low Risk  (11/06/2023)  Transportation Needs: No Transportation Needs (11/06/2023)  Utilities: Not At Risk (11/06/2023)  Depression (PHQ2-9): Medium Risk (08/16/2023)  Financial Resource Strain: Low Risk  (07/21/2021)   Received from [facility], [facility]  Physical Activity: Insufficiently Active (07/21/2021)   Received from [facility], [facility]  Social Connections: Unknown (11/06/2023)  Stress: Stress Concern Present (07/21/2021)   Received from [facility], [facility]  Tobacco Use: Low Risk  (11/06/2023)  Recent Concern: Tobacco Use - Medium Risk (08/31/2023)   Received from [facility]   Readmission Risk Interventions    11/07/2023   10:15 AM 08/05/2023    3:21 PM  Readmission Risk Prevention Plan  Transportation Screening Complete Complete  PCP or Specialist Appt within 5-7 Days  Complete  Home Care Screening  Complete  Medication Review (RN CM)  Complete  HRI or Home Care Consult Complete   Social Work Consult for Recovery Care Planning/Counseling Complete   Palliative Care Screening Not Applicable   Medication Review Oceanographer)  Complete

## 2023-11-11 ENCOUNTER — Telehealth: Payer: Self-pay | Admitting: *Deleted

## 2023-11-11 DIAGNOSIS — I82412 Acute embolism and thrombosis of left femoral vein: Secondary | ICD-10-CM | POA: Insufficient documentation

## 2023-11-11 NOTE — Telephone Encounter (Signed)
 Per Dr. Scherrie Curt: Delay start of Xeloda  till after OV on 5/15. Called patient and left VM with this information as well as the time of her 5/15 appointments. Also made aware via MyChart message.

## 2023-11-17 ENCOUNTER — Inpatient Hospital Stay (HOSPITAL_BASED_OUTPATIENT_CLINIC_OR_DEPARTMENT_OTHER): Admitting: Nurse Practitioner

## 2023-11-17 ENCOUNTER — Encounter: Payer: Self-pay | Admitting: Nurse Practitioner

## 2023-11-17 ENCOUNTER — Inpatient Hospital Stay

## 2023-11-17 VITALS — BP 104/82 | HR 69 | Temp 97.8°F | Resp 18 | Ht 62.0 in | Wt 169.0 lb

## 2023-11-17 DIAGNOSIS — C184 Malignant neoplasm of transverse colon: Secondary | ICD-10-CM | POA: Diagnosis not present

## 2023-11-17 DIAGNOSIS — I82512 Chronic embolism and thrombosis of left femoral vein: Secondary | ICD-10-CM

## 2023-11-17 DIAGNOSIS — I2692 Saddle embolus of pulmonary artery without acute cor pulmonale: Secondary | ICD-10-CM

## 2023-11-17 DIAGNOSIS — Z800 Family history of malignant neoplasm of digestive organs: Secondary | ICD-10-CM | POA: Diagnosis not present

## 2023-11-17 DIAGNOSIS — Z860100 Personal history of colon polyps, unspecified: Secondary | ICD-10-CM | POA: Diagnosis not present

## 2023-11-17 DIAGNOSIS — I82412 Acute embolism and thrombosis of left femoral vein: Secondary | ICD-10-CM | POA: Diagnosis not present

## 2023-11-17 DIAGNOSIS — I82532 Chronic embolism and thrombosis of left popliteal vein: Secondary | ICD-10-CM | POA: Diagnosis not present

## 2023-11-17 DIAGNOSIS — Z7901 Long term (current) use of anticoagulants: Secondary | ICD-10-CM

## 2023-11-17 LAB — CEA (ACCESS): CEA (CHCC): 3.39 ng/mL (ref 0.00–5.00)

## 2023-11-17 LAB — CBC WITH DIFFERENTIAL (CANCER CENTER ONLY)
Abs Immature Granulocytes: 0.01 10*3/uL (ref 0.00–0.07)
Basophils Absolute: 0 10*3/uL (ref 0.0–0.1)
Basophils Relative: 1 %
Eosinophils Absolute: 0.1 10*3/uL (ref 0.0–0.5)
Eosinophils Relative: 3 %
HCT: 35.2 % — ABNORMAL LOW (ref 36.0–46.0)
Hemoglobin: 11.5 g/dL — ABNORMAL LOW (ref 12.0–15.0)
Immature Granulocytes: 0 %
Lymphocytes Relative: 29 %
Lymphs Abs: 1.3 10*3/uL (ref 0.7–4.0)
MCH: 30.7 pg (ref 26.0–34.0)
MCHC: 32.7 g/dL (ref 30.0–36.0)
MCV: 94.1 fL (ref 80.0–100.0)
Monocytes Absolute: 0.5 10*3/uL (ref 0.1–1.0)
Monocytes Relative: 12 %
Neutro Abs: 2.4 10*3/uL (ref 1.7–7.7)
Neutrophils Relative %: 55 %
Platelet Count: 201 10*3/uL (ref 150–400)
RBC: 3.74 MIL/uL — ABNORMAL LOW (ref 3.87–5.11)
RDW: 25.6 % — ABNORMAL HIGH (ref 11.5–15.5)
WBC Count: 4.4 10*3/uL (ref 4.0–10.5)
nRBC: 0 % (ref 0.0–0.2)

## 2023-11-17 LAB — CMP (CANCER CENTER ONLY)
ALT: 17 U/L (ref 0–44)
AST: 20 U/L (ref 15–41)
Albumin: 4.6 g/dL (ref 3.5–5.0)
Alkaline Phosphatase: 103 U/L (ref 38–126)
Anion gap: 14 (ref 5–15)
BUN: 30 mg/dL — ABNORMAL HIGH (ref 8–23)
CO2: 22 mmol/L (ref 22–32)
Calcium: 10.3 mg/dL (ref 8.9–10.3)
Chloride: 105 mmol/L (ref 98–111)
Creatinine: 1.6 mg/dL — ABNORMAL HIGH (ref 0.44–1.00)
GFR, Estimated: 32 mL/min — ABNORMAL LOW (ref >60)
Glucose, Bld: 110 mg/dL — ABNORMAL HIGH (ref 70–99)
Potassium: 4.4 mmol/L (ref 3.5–5.1)
Sodium: 140 mmol/L (ref 135–145)
Total Bilirubin: 0.4 mg/dL (ref 0.0–1.2)
Total Protein: 7.3 g/dL (ref 6.5–8.1)

## 2023-11-17 NOTE — Progress Notes (Signed)
 [facility] OFFICE PROGRESS NOTE   Diagnosis: Colon cancer  INTERVAL HISTORY:   Adrienne Rogers returns for follow-up.  She completed cycle 3 capecitabine  beginning 10/20/2023.  Dose was reduced to 1000 mg twice daily per patient request.  She was hospitalized 11/06/2023 with dyspnea, found to have a saddle pulmonary embolism on CT chest.  Initially anticoagulated with heparin  then transitioned to apixaban .  She was discharged home 11/10/2023.  The dyspnea is better.  She denies any bleeding.  She did not experience nausea/vomiting, mouth sores or diarrhea following the most recent chemotherapy.  No hand or foot pain or redness.  No blisters.  She notes hands are dry.  She had less dizziness with the decreased dose of Xeloda   Objective:  Vital signs in last 24 hours:  Blood pressure 104/82, pulse 69, temperature 97.8 F (36.6 C), temperature source Temporal, resp. rate 18, height 5\' 2"  (1.575 m), weight 169 lb (76.7 kg), SpO2 98%.    HEENT: No thrush or ulcers. Resp: Lungs clear bilaterally. Cardio: Regular rate and rhythm. GI: No hepatosplenomegaly. Vascular: No leg edema. Skin: Dorsal aspect of feet are dry.  Soles nontender without erythema.  Palms dry appearing, skin thickening.   Lab Results:  Lab Results  Component Value Date   WBC 4.4 11/17/2023   HGB 11.5 (L) 11/17/2023   HCT 35.2 (L) 11/17/2023   MCV 94.1 11/17/2023   PLT 201 11/17/2023   NEUTROABS 2.4 11/17/2023    Imaging:  No results found.  Medications: I have reviewed the patient's current medications.  Assessment/Plan:  Transverse colon cancer, stage IIIb (pT3,pN1b,cM0), status post a segmental transverse colectomy 08/03/2023 CT abdomen/pelvis 07/22/2023: Proximal colonic distention with transition point in the transverse colon, 4 cm lesion of the distal transverse colon with evidence of transmural extension/serosal component, small lymph nodes in the transverse mesocolon, indeterminate 1.9 cm  left adrenal nodule Colonoscopy 07/22/2023: Mass in the ascending colon biopsy-invasive moderately differentiated adenocarcinoma CT chest 08/02/2023: No evidence of metastatic disease, indeterminate left adrenal nodule Segmental transverse colectomy and colocolonic anastomosis 08/03/2023-moderately differentiated adenocarcinoma of the transverse colon, tumor extends into pericolonic adipose tissue, lymphovascular invasion present, 2/13 nodes Mismatch repair protein expression intact, MSI stable Elevated preoperative CEA 08/02/2023 (42.7) 08/31/2023 CEA normal (4.56) Cycle 1 capecitabine  09/08/2023 Cycle 2 capecitabine  09/29/2023 Cycle 3 capecitabine  10/20/2023, capecitabine  dose reduced to 1000 mg twice daily Cycle 4 capecitabine  11/18/2023, capecitabine  dose reduced to 1000 mg twice daily Indeterminate left adrenal nodule on CT abdomen/pelvis 07/22/2023 History of colonic polyps Hypertension For lipidemia Asthma History of H. 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  She has completed 3 cycles of adjuvant capecitabine .  Capecitabine  was dose reduced with cycle 3 per her request.  She feels the dizziness was less.  She will begin cycle 4 on 11/18/2023, continue reduced dose.  CBC and chemistry panel reviewed.  Labs adequate to proceed with treatment as above.  She was hospitalized 11/06/2023 through 11/10/2023 with dyspnea, found to have a saddle pulmonary embolism.  She is now on apixaban .  She will return for lab and follow-up in 3 weeks.  We are available to see her sooner if needed.  Adrienne Rogers ANP/GNP-BC   11/17/2023  11:21 AM

## 2023-11-22 ENCOUNTER — Other Ambulatory Visit: Payer: Self-pay

## 2023-11-23 ENCOUNTER — Other Ambulatory Visit: Payer: Self-pay

## 2023-12-07 ENCOUNTER — Encounter: Payer: Self-pay | Admitting: Nurse Practitioner

## 2023-12-07 ENCOUNTER — Inpatient Hospital Stay

## 2023-12-07 ENCOUNTER — Inpatient Hospital Stay: Attending: Oncology

## 2023-12-07 ENCOUNTER — Inpatient Hospital Stay (HOSPITAL_BASED_OUTPATIENT_CLINIC_OR_DEPARTMENT_OTHER): Admitting: Nurse Practitioner

## 2023-12-07 VITALS — BP 99/74 | HR 71 | Temp 98.0°F | Resp 18 | Ht 62.0 in | Wt 173.1 lb

## 2023-12-07 VITALS — BP 126/85 | HR 61

## 2023-12-07 DIAGNOSIS — I82512 Chronic embolism and thrombosis of left femoral vein: Secondary | ICD-10-CM | POA: Insufficient documentation

## 2023-12-07 DIAGNOSIS — R42 Dizziness and giddiness: Secondary | ICD-10-CM | POA: Diagnosis not present

## 2023-12-07 DIAGNOSIS — E860 Dehydration: Secondary | ICD-10-CM

## 2023-12-07 DIAGNOSIS — C184 Malignant neoplasm of transverse colon: Secondary | ICD-10-CM | POA: Insufficient documentation

## 2023-12-07 DIAGNOSIS — Z860100 Personal history of colon polyps, unspecified: Secondary | ICD-10-CM | POA: Insufficient documentation

## 2023-12-07 DIAGNOSIS — Z8619 Personal history of other infectious and parasitic diseases: Secondary | ICD-10-CM | POA: Insufficient documentation

## 2023-12-07 DIAGNOSIS — Z800 Family history of malignant neoplasm of digestive organs: Secondary | ICD-10-CM | POA: Diagnosis not present

## 2023-12-07 DIAGNOSIS — I2692 Saddle embolus of pulmonary artery without acute cor pulmonale: Secondary | ICD-10-CM | POA: Insufficient documentation

## 2023-12-07 LAB — CBC WITH DIFFERENTIAL (CANCER CENTER ONLY)
Abs Immature Granulocytes: 0.01 10*3/uL (ref 0.00–0.07)
Basophils Absolute: 0 10*3/uL (ref 0.0–0.1)
Basophils Relative: 1 %
Eosinophils Absolute: 0.3 10*3/uL (ref 0.0–0.5)
Eosinophils Relative: 8 %
HCT: 35 % — ABNORMAL LOW (ref 36.0–46.0)
Hemoglobin: 11.5 g/dL — ABNORMAL LOW (ref 12.0–15.0)
Immature Granulocytes: 0 %
Lymphocytes Relative: 29 %
Lymphs Abs: 1.2 10*3/uL (ref 0.7–4.0)
MCH: 32 pg (ref 26.0–34.0)
MCHC: 32.9 g/dL (ref 30.0–36.0)
MCV: 97.5 fL (ref 80.0–100.0)
Monocytes Absolute: 0.4 10*3/uL (ref 0.1–1.0)
Monocytes Relative: 10 %
Neutro Abs: 2.1 10*3/uL (ref 1.7–7.7)
Neutrophils Relative %: 52 %
Platelet Count: 185 10*3/uL (ref 150–400)
RBC: 3.59 MIL/uL — ABNORMAL LOW (ref 3.87–5.11)
RDW: 22.7 % — ABNORMAL HIGH (ref 11.5–15.5)
WBC Count: 4 10*3/uL (ref 4.0–10.5)
nRBC: 0 % (ref 0.0–0.2)

## 2023-12-07 LAB — CMP (CANCER CENTER ONLY)
ALT: 15 U/L (ref 0–44)
AST: 19 U/L (ref 15–41)
Albumin: 4.3 g/dL (ref 3.5–5.0)
Alkaline Phosphatase: 107 U/L (ref 38–126)
Anion gap: 14 (ref 5–15)
BUN: 25 mg/dL — ABNORMAL HIGH (ref 8–23)
CO2: 23 mmol/L (ref 22–32)
Calcium: 10.5 mg/dL — ABNORMAL HIGH (ref 8.9–10.3)
Chloride: 104 mmol/L (ref 98–111)
Creatinine: 1.46 mg/dL — ABNORMAL HIGH (ref 0.44–1.00)
GFR, Estimated: 36 mL/min — ABNORMAL LOW (ref >60)
Glucose, Bld: 166 mg/dL — ABNORMAL HIGH (ref 70–99)
Potassium: 4.2 mmol/L (ref 3.5–5.1)
Sodium: 141 mmol/L (ref 135–145)
Total Bilirubin: 0.3 mg/dL (ref 0.0–1.2)
Total Protein: 7.3 g/dL (ref 6.5–8.1)

## 2023-12-07 LAB — CEA (ACCESS): CEA (CHCC): 3.78 ng/mL (ref 0.00–5.00)

## 2023-12-07 MED ORDER — SODIUM CHLORIDE 0.9 % IV SOLN: INTRAVENOUS | Status: AC

## 2023-12-07 NOTE — Progress Notes (Signed)
 [facility] OFFICE PROGRESS NOTE   Diagnosis: Colon cancer  INTERVAL HISTORY:   Ms. Hockman returns as scheduled.  She completed cycle 4 capecitabine  beginning 11/18/2023.  She denies nausea/vomiting.  No mouth sores.  No diarrhea.  No hand or foot pain or redness.  She reports having "daily vertigo" while on Xeloda .  Symptoms persisted for a few days after she completed the 14-day course.  She was unable to "walk or fix food" during this timeframe.  Her daughter came from out of state to assist with her care.  She has had no dizziness over the past 4 days.  She reports seeing her PCP last week.  She was given instructions to hold her blood pressure medication if certain parameters were not met.  She took her blood pressure medication this morning but forgot to check her blood pressure.  She denies dizziness at present.  She feels fluid intake is suboptimal and wonders if she may be dehydrated.  Objective:  Vital signs in last 24 hours:  Blood pressure 99/74, pulse 71, temperature 98 F (36.7 C), temperature source Temporal, resp. rate 18, height 5\' 2"  (1.575 m), weight 173 lb 1.6 oz (78.5 kg), SpO2 99%.    HEENT: No thrush or ulcers. Resp: Lungs clear bilaterally. Cardio: Regular rate and rhythm. GI: No hepatosplenomegaly. Vascular: No leg edema. Neuro: Alert and oriented.  Gait normal. Skin: Palms and soles without erythema.  Feet are dry appearing.   Lab Results:  Lab Results  Component Value Date   WBC 4.0 12/07/2023   HGB 11.5 (L) 12/07/2023   HCT 35.0 (L) 12/07/2023   MCV 97.5 12/07/2023   PLT 185 12/07/2023   NEUTROABS 2.1 12/07/2023    Imaging:  No results found.  Medications: I have reviewed the patient's current medications.  Assessment/Plan: Transverse colon cancer, stage IIIb (pT3,pN1b,cM0), status post a segmental transverse colectomy 08/03/2023 CT abdomen/pelvis 07/22/2023: Proximal colonic distention with transition point in the transverse  colon, 4 cm lesion of the distal transverse colon with evidence of transmural extension/serosal component, small lymph nodes in the transverse mesocolon, indeterminate 1.9 cm left adrenal nodule Colonoscopy 07/22/2023: Mass in the ascending colon biopsy-invasive moderately differentiated adenocarcinoma CT chest 08/02/2023: No evidence of metastatic disease, indeterminate left adrenal nodule Segmental transverse colectomy and colocolonic anastomosis 08/03/2023-moderately differentiated adenocarcinoma of the transverse colon, tumor extends into pericolonic adipose tissue, lymphovascular invasion present, 2/13 nodes Mismatch repair protein expression intact, MSI stable Elevated preoperative CEA 08/02/2023 (42.7) 08/31/2023 CEA normal (4.56) Cycle 1 capecitabine  09/08/2023 Cycle 2 capecitabine  09/29/2023 Cycle 3 capecitabine  10/20/2023, capecitabine  dose reduced to 1000 mg twice daily Cycle 4 capecitabine  11/18/2023, capecitabine  dose reduced to 1000 mg twice daily Indeterminate left adrenal nodule on CT abdomen/pelvis 07/22/2023 History of colonic polyps Hypertension For lipidemia Asthma History of H. 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 Lindenberger appears stable.  She has completed 4 cycles of adjuvant capecitabine .  She developed significant dizziness that persisted throughout cycle 4.  She was unable to take care of herself.  It is unclear if the dizziness was related to capecitabine .  We decided to place capecitabine  on hold, see how the dizziness does over the next week, then reevaluate.  She agrees with this plan.  She is no longer experiencing dizziness but is orthostatic in the  office today.  Question component of dehydration.  She will receive 500 cc of normal saline with repeat vital signs.  We will contact her PCP with current blood pressure readings.  She may need medication adjustment.  She will return for lab and follow-up in 1 week.  We are available to see her sooner if needed.    Diana Forster ANP/GNP-BC   12/07/2023  12:50 PM

## 2023-12-08 ENCOUNTER — Telehealth: Payer: Self-pay

## 2023-12-08 NOTE — Telephone Encounter (Signed)
 [phone] and spoke with Normand Beckwith, RN, regarding the patient's recent visit. I informed her that the patient is experiencing issues with blood pressure, including orthostatic symptoms and dizziness. I recommend that the provider review and adjust the patient's blood pressure medication as needed and return the call to follow up.

## 2023-12-09 ENCOUNTER — Other Ambulatory Visit: Payer: Self-pay

## 2023-12-13 ENCOUNTER — Other Ambulatory Visit: Payer: Self-pay

## 2023-12-15 ENCOUNTER — Inpatient Hospital Stay

## 2023-12-15 ENCOUNTER — Encounter: Payer: Self-pay | Admitting: Nurse Practitioner

## 2023-12-15 ENCOUNTER — Inpatient Hospital Stay: Admitting: Nurse Practitioner

## 2023-12-15 ENCOUNTER — Telehealth: Payer: Self-pay

## 2023-12-15 VITALS — BP 92/71 | HR 96 | Temp 97.9°F | Resp 18 | Ht 62.0 in | Wt 172.4 lb

## 2023-12-15 DIAGNOSIS — Z86711 Personal history of pulmonary embolism: Secondary | ICD-10-CM

## 2023-12-15 DIAGNOSIS — C184 Malignant neoplasm of transverse colon: Secondary | ICD-10-CM | POA: Diagnosis not present

## 2023-12-15 DIAGNOSIS — Z800 Family history of malignant neoplasm of digestive organs: Secondary | ICD-10-CM

## 2023-12-15 DIAGNOSIS — Z7901 Long term (current) use of anticoagulants: Secondary | ICD-10-CM | POA: Diagnosis not present

## 2023-12-15 LAB — CBC WITH DIFFERENTIAL (CANCER CENTER ONLY)
Abs Immature Granulocytes: 0.02 10*3/uL (ref 0.00–0.07)
Basophils Absolute: 0.1 10*3/uL (ref 0.0–0.1)
Basophils Relative: 1 %
Eosinophils Absolute: 0.3 10*3/uL (ref 0.0–0.5)
Eosinophils Relative: 6 %
HCT: 34.4 % — ABNORMAL LOW (ref 36.0–46.0)
Hemoglobin: 11.5 g/dL — ABNORMAL LOW (ref 12.0–15.0)
Immature Granulocytes: 0 %
Lymphocytes Relative: 22 %
Lymphs Abs: 1.1 10*3/uL (ref 0.7–4.0)
MCH: 32.1 pg (ref 26.0–34.0)
MCHC: 33.4 g/dL (ref 30.0–36.0)
MCV: 96.1 fL (ref 80.0–100.0)
Monocytes Absolute: 0.7 10*3/uL (ref 0.1–1.0)
Monocytes Relative: 14 %
Neutro Abs: 2.8 10*3/uL (ref 1.7–7.7)
Neutrophils Relative %: 57 %
Platelet Count: 208 10*3/uL (ref 150–400)
RBC: 3.58 MIL/uL — ABNORMAL LOW (ref 3.87–5.11)
RDW: 20.4 % — ABNORMAL HIGH (ref 11.5–15.5)
WBC Count: 4.9 10*3/uL (ref 4.0–10.5)
nRBC: 0 % (ref 0.0–0.2)

## 2023-12-15 LAB — CMP (CANCER CENTER ONLY)
ALT: 16 U/L (ref 0–44)
AST: 21 U/L (ref 15–41)
Albumin: 4.4 g/dL (ref 3.5–5.0)
Alkaline Phosphatase: 111 U/L (ref 38–126)
Anion gap: 13 (ref 5–15)
BUN: 24 mg/dL — ABNORMAL HIGH (ref 8–23)
CO2: 22 mmol/L (ref 22–32)
Calcium: 10 mg/dL (ref 8.9–10.3)
Chloride: 106 mmol/L (ref 98–111)
Creatinine: 1.35 mg/dL — ABNORMAL HIGH (ref 0.44–1.00)
GFR, Estimated: 39 mL/min — ABNORMAL LOW (ref >60)
Glucose, Bld: 109 mg/dL — ABNORMAL HIGH (ref 70–99)
Potassium: 4.4 mmol/L (ref 3.5–5.1)
Sodium: 141 mmol/L (ref 135–145)
Total Bilirubin: 0.3 mg/dL (ref 0.0–1.2)
Total Protein: 7.3 g/dL (ref 6.5–8.1)

## 2023-12-15 NOTE — Telephone Encounter (Signed)
 Reached out to the [place], spoke with Janine/ Monique from the clinical contact center in regards to making patients PCP aware of recent visit today. Stated no direct line to speak with a nurse, leave contact information and a nurse/ triage nurse will call back today or tomorrow. 44Leola Raisin RN, covering for PCP  notified of patients BP continuing to drop, orthostatics- 122/75 supine, 119/74 sitting, 92/71 standing, no dizziness in clinic today. Patient did report having continuous dizziness when taking Xeloda . NP already aware. Recommended PCP review/ make adjustments to BP medication. Contact if any further questions needed and a return phone call of any changes in BP medication. Understood.

## 2023-12-15 NOTE — Progress Notes (Signed)
 [facility] OFFICE PROGRESS NOTE   Diagnosis: Colon cancer  INTERVAL HISTORY:   Adrienne Rogers returns as scheduled.  She has completed 4 cycles of adjuvant capecitabine .  At her last office visit 12/07/2023 she reported developing dizziness that persisted throughout cycle 4.  She was unable to care for herself during this time.  The dizziness resolved during the 7-day break.  She was orthostatic.  She received IV fluids with improvement.  We contacted her PCPs office to review/adjust antihypertensive regimen.  She has had no further dizziness.  She reports carvedilol  was decreased to 12.5 mg once a day.  No nausea or vomiting.  No diarrhea.  No hand or foot pain or redness.  She reports good fluid intake.  Objective:  Vital signs in last 24 hours:  Blood pressure 125/81, pulse 73, temperature 97.9 F (36.6 C), temperature source Temporal, resp. rate 18, height 5' 2 (1.575 m), weight 172 lb 6.4 oz (78.2 kg), SpO2 98%.    HEENT: No thrush or ulcers. Resp: Lungs clear bilaterally. Cardio: Regular rate and rhythm. GI: Abdomen soft and nontender.  No hepatosplenomegaly. Vascular: No leg edema. Neuro: Alert and oriented. Skin: Hands and feet with hyperpigmentation.  No erythema or skin breakdown.   Lab Results:  Lab Results  Component Value Date   WBC 4.9 12/15/2023   HGB 11.5 (L) 12/15/2023   HCT 34.4 (L) 12/15/2023   MCV 96.1 12/15/2023   PLT 208 12/15/2023   NEUTROABS 2.8 12/15/2023    Imaging:  No results found.  Medications: I have reviewed the patient's current medications.  Assessment/Plan: Transverse colon cancer, stage IIIb (pT3,pN1b,cM0), status post a segmental transverse colectomy 08/03/2023 CT abdomen/pelvis 07/22/2023: Proximal colonic distention with transition point in the transverse colon, 4 cm lesion of the distal transverse colon with evidence of transmural extension/serosal component, small lymph nodes in the transverse mesocolon,  indeterminate 1.9 cm left adrenal nodule Colonoscopy 07/22/2023: Mass in the ascending colon biopsy-invasive moderately differentiated adenocarcinoma CT chest 08/02/2023: No evidence of metastatic disease, indeterminate left adrenal nodule Segmental transverse colectomy and colocolonic anastomosis 08/03/2023-moderately differentiated adenocarcinoma of the transverse colon, tumor extends into pericolonic adipose tissue, lymphovascular invasion present, 2/13 nodes Mismatch repair protein expression intact, MSI stable Elevated preoperative CEA 08/02/2023 (42.7) 08/31/2023 CEA normal (4.56) Cycle 1 capecitabine  09/08/2023 Cycle 2 capecitabine  09/29/2023 Cycle 3 capecitabine  10/20/2023, capecitabine  dose reduced to 1000 mg twice daily Cycle 4 capecitabine  11/18/2023, capecitabine  dose reduced to 1000 mg twice daily Cycle 5 capecitabine  12/16/2023, capecitabine  dose reduced to 1000 mg twice daily Indeterminate left adrenal nodule on CT abdomen/pelvis 07/22/2023 History of colonic polyps Hypertension For lipidemia Asthma History of H. 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  She has completed 4 cycles of adjuvant capecitabine .  Cycle 5 was held last week due to dizziness.  She was noted to be orthostatic. We gave IV fluids and notified her PCP.  Blood pressure medication has been adjusted.  She has had no further dizziness.    We discussed proceeding with another cycle of capecitabine .  She understands dizziness is not a common side effect of capecitabine  but it is possible it is somehow related.  Orthostasis  could be contributing to the dizziness.  She agrees to  proceed with another cycle and plans to begin on 12/16/2023.  She will discontinue capecitabine  if the dizziness recurs.  CBC and chemistry panel reviewed.  Labs adequate for treatment.  She will return for lab and follow-up in approximately 3 weeks.  We are available to see her sooner if needed.  Plan reviewed with Dr. Scherrie Curt.   Diana Forster ANP/GNP-BC   12/15/2023  2:50 PM

## 2023-12-20 ENCOUNTER — Other Ambulatory Visit: Payer: Self-pay

## 2023-12-20 NOTE — Progress Notes (Signed)
 Specialty Pharmacy Ongoing Clinical Assessment Note  Adrienne Rogers is a 83 y.o. female who is being followed by the specialty pharmacy service for RxSp Oncology   Patient's specialty medication(s) reviewed today: Capecitabine  (XELODA )   Missed doses in the last 4 weeks: More than 5   Patient/Caregiver did not have any additional questions or concerns.   Therapeutic benefit summary: Patient is achieving benefit   Adverse events/side effects summary: No adverse events/side effects (Pt restarted med on 6/12 after holding due to dizziness)   Patient's therapy is appropriate to: Continue    Goals Addressed             This Visit's Progress    Achieve a cure   On track    Patient is on track. Patient will maintain adherence. Patient's CEA on 11/17/23 was 3.78 ng/mL         Follow up: 3 months  [facility] Specialty Pharmacist

## 2023-12-28 ENCOUNTER — Other Ambulatory Visit: Payer: Self-pay | Admitting: Oncology

## 2023-12-28 ENCOUNTER — Other Ambulatory Visit (HOSPITAL_COMMUNITY): Payer: Self-pay

## 2023-12-28 ENCOUNTER — Telehealth: Payer: Self-pay | Admitting: *Deleted

## 2023-12-28 DIAGNOSIS — C184 Malignant neoplasm of transverse colon: Secondary | ICD-10-CM

## 2023-12-28 NOTE — Telephone Encounter (Signed)
 Received refill request from pharmacy for Xeloda . Called Adrienne Rogers and she informed nurse that she resumed the medication on 6/13 and took for 8 days and then got dizzy again and stopped it.

## 2023-12-28 NOTE — Telephone Encounter (Signed)
 Hold off till 6/30 visit. Stopped last cycle after 8 days (will have 6 day supply on hand)

## 2023-12-30 ENCOUNTER — Other Ambulatory Visit: Payer: Self-pay

## 2023-12-30 ENCOUNTER — Other Ambulatory Visit: Payer: Self-pay | Admitting: Oncology

## 2023-12-30 DIAGNOSIS — C184 Malignant neoplasm of transverse colon: Secondary | ICD-10-CM

## 2024-01-02 ENCOUNTER — Inpatient Hospital Stay (HOSPITAL_BASED_OUTPATIENT_CLINIC_OR_DEPARTMENT_OTHER): Admitting: Nurse Practitioner

## 2024-01-02 ENCOUNTER — Inpatient Hospital Stay

## 2024-01-02 VITALS — BP 115/64 | HR 83 | Temp 97.8°F | Resp 18 | Ht 62.0 in | Wt 173.0 lb

## 2024-01-02 DIAGNOSIS — I2692 Saddle embolus of pulmonary artery without acute cor pulmonale: Secondary | ICD-10-CM | POA: Diagnosis not present

## 2024-01-02 DIAGNOSIS — C184 Malignant neoplasm of transverse colon: Secondary | ICD-10-CM

## 2024-01-02 DIAGNOSIS — I82412 Acute embolism and thrombosis of left femoral vein: Secondary | ICD-10-CM

## 2024-01-02 DIAGNOSIS — K55059 Acute (reversible) ischemia of intestine, part and extent unspecified: Secondary | ICD-10-CM

## 2024-01-02 DIAGNOSIS — Z7901 Long term (current) use of anticoagulants: Secondary | ICD-10-CM

## 2024-01-02 DIAGNOSIS — I82432 Acute embolism and thrombosis of left popliteal vein: Secondary | ICD-10-CM

## 2024-01-02 DIAGNOSIS — Z800 Family history of malignant neoplasm of digestive organs: Secondary | ICD-10-CM

## 2024-01-02 DIAGNOSIS — Z860100 Personal history of colon polyps, unspecified: Secondary | ICD-10-CM

## 2024-01-02 DIAGNOSIS — E278 Other specified disorders of adrenal gland: Secondary | ICD-10-CM | POA: Diagnosis not present

## 2024-01-02 DIAGNOSIS — Z8619 Personal history of other infectious and parasitic diseases: Secondary | ICD-10-CM

## 2024-01-02 LAB — CBC WITH DIFFERENTIAL (CANCER CENTER ONLY)
Abs Immature Granulocytes: 0.02 10*3/uL (ref 0.00–0.07)
Basophils Absolute: 0.1 10*3/uL (ref 0.0–0.1)
Basophils Relative: 1 %
Eosinophils Absolute: 0.2 10*3/uL (ref 0.0–0.5)
Eosinophils Relative: 5 %
HCT: 33.9 % — ABNORMAL LOW (ref 36.0–46.0)
Hemoglobin: 11.5 g/dL — ABNORMAL LOW (ref 12.0–15.0)
Immature Granulocytes: 0 %
Lymphocytes Relative: 23 %
Lymphs Abs: 1 10*3/uL (ref 0.7–4.0)
MCH: 32.7 pg (ref 26.0–34.0)
MCHC: 33.9 g/dL (ref 30.0–36.0)
MCV: 96.3 fL (ref 80.0–100.0)
Monocytes Absolute: 0.5 10*3/uL (ref 0.1–1.0)
Monocytes Relative: 11 %
Neutro Abs: 2.7 10*3/uL (ref 1.7–7.7)
Neutrophils Relative %: 60 %
Platelet Count: 208 10*3/uL (ref 150–400)
RBC: 3.52 MIL/uL — ABNORMAL LOW (ref 3.87–5.11)
RDW: 17.6 % — ABNORMAL HIGH (ref 11.5–15.5)
WBC Count: 4.5 10*3/uL (ref 4.0–10.5)
nRBC: 0 % (ref 0.0–0.2)

## 2024-01-02 LAB — CMP (CANCER CENTER ONLY)
ALT: 17 U/L (ref 0–44)
AST: 21 U/L (ref 15–41)
Albumin: 4.2 g/dL (ref 3.5–5.0)
Alkaline Phosphatase: 120 U/L (ref 38–126)
Anion gap: 12 (ref 5–15)
BUN: 23 mg/dL (ref 8–23)
CO2: 24 mmol/L (ref 22–32)
Calcium: 10.3 mg/dL (ref 8.9–10.3)
Chloride: 105 mmol/L (ref 98–111)
Creatinine: 1.63 mg/dL — ABNORMAL HIGH (ref 0.44–1.00)
GFR, Estimated: 31 mL/min — ABNORMAL LOW (ref >60)
Glucose, Bld: 165 mg/dL — ABNORMAL HIGH (ref 70–99)
Potassium: 4.1 mmol/L (ref 3.5–5.1)
Sodium: 141 mmol/L (ref 135–145)
Total Bilirubin: 0.4 mg/dL (ref 0.0–1.2)
Total Protein: 7.5 g/dL (ref 6.5–8.1)

## 2024-01-02 NOTE — Progress Notes (Signed)
 Referral for CSW placed per patient request for counseling and advanced directive assistance

## 2024-01-02 NOTE — Progress Notes (Signed)
 [facility] OFFICE PROGRESS NOTE   Diagnosis: Colon cancer  INTERVAL HISTORY:   Ms. Moura returns as scheduled.  She began cycle 5 adjuvant capecitabine  12/16/2023.  She began experiencing dizziness day 8 and discontinued capecitabine .  The dizziness resolved over about 3 to 4 days.  No nausea or vomiting.  No mouth sores.  No diarrhea.  No hand or foot pain or redness  Objective:  Vital signs in last 24 hours:  Blood pressure 115/64, pulse 83, temperature 97.8 F (36.6 C), resp. rate 18, height 5' 2 (1.575 m), weight 173 lb (78.5 kg), SpO2 92%.    HEENT: No thrush or ulcers. Resp: Lungs clear bilaterally. Cardio: Regular rate and rhythm. GI: No hepatosplenomegaly. Vascular: No leg edema. Skin: Palms without erythema or skin breakdown.   Lab Results:  Lab Results  Component Value Date   WBC 4.5 01/02/2024   HGB 11.5 (L) 01/02/2024   HCT 33.9 (L) 01/02/2024   MCV 96.3 01/02/2024   PLT 208 01/02/2024   NEUTROABS 2.7 01/02/2024    Imaging:  No results found.  Medications: I have reviewed the patient's current medications.  Assessment/Plan: Transverse colon cancer, stage IIIb (pT3,pN1b,cM0), status post a segmental transverse colectomy 08/03/2023 CT abdomen/pelvis 07/22/2023: Proximal colonic distention with transition point in the transverse colon, 4 cm lesion of the distal transverse colon with evidence of transmural extension/serosal component, small lymph nodes in the transverse mesocolon, indeterminate 1.9 cm left adrenal nodule Colonoscopy 07/22/2023: Mass in the ascending colon biopsy-invasive moderately differentiated adenocarcinoma CT chest 08/02/2023: No evidence of metastatic disease, indeterminate left adrenal nodule Segmental transverse colectomy and colocolonic anastomosis 08/03/2023-moderately differentiated adenocarcinoma of the transverse colon, tumor extends into pericolonic adipose tissue, lymphovascular invasion present, 2/13  nodes Mismatch repair protein expression intact, MSI stable Elevated preoperative CEA 08/02/2023 (42.7) 08/31/2023 CEA normal (4.56) Cycle 1 capecitabine  09/08/2023 Cycle 2 capecitabine  09/29/2023 Cycle 3 capecitabine  10/20/2023, capecitabine  dose reduced to 1000 mg twice daily Cycle 4 capecitabine  11/18/2023, capecitabine  dose reduced to 1000 mg twice daily Cycle 5 capecitabine  12/16/2023, capecitabine  dose reduced to 1000 mg twice daily, discontinued day 8 due to recurrent dizziness Indeterminate left adrenal nodule on CT abdomen/pelvis 07/22/2023 History of colonic polyps Hypertension For lipidemia Asthma History of H. 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 Zell appears stable.  She has completed 4+ cycles of adjuvant capecitabine .  She develops increased dizziness when she takes capecitabine .  We discussed discontinuation of adjuvant therapy versus switching to weekly 5-FU/leucovorin per the [place] regimen to complete approximately 6 months of therapy.  We reviewed potential side effects.  She will discuss this with her daughter and let us  know the final decision.  She does not have adequate peripheral venous access for chemotherapy administration.  She understands a PICC line or Port-A-Cath will be needed.  We have scheduled her to begin treatment in approximately 1 week but will adjust accordingly once she has had time to review this with her daughter.  Patient seen with Dr. Cloretta.   Olam Ned ANP/GNP-BC   01/02/2024  1:30 PM   This was a shared visit with Olam Ned.  Ms. Boys again developed dizziness during the most recent cycle of  capecitabine .  She describes vertigo symptoms and dizziness with position change.  I think it is unlikely her symptoms are related to capecitabine , but this is possible.  We discussed discontinuing chemotherapy, continuing capecitabine , and changing to 5-FU/leucovorin.  She prefers 5-FU/leucovorin.  I recommend 6 weeks of weekly 5-FU/leucovorin per the [place] regimen.  We reviewed potential toxicities associated with this regimen including the chance of hematologic toxicity, diarrhea, mucositis, and cardiac toxicity.  She understands the dizziness could occur while on 5-fluorouracil/leucovorin.  She agrees to proceed.   A treatment plan was entered today.  I was present for greater than 50% of today's visit.  I performed medical decision making.  Arvella Hof, MD

## 2024-01-03 ENCOUNTER — Inpatient Hospital Stay: Attending: Oncology

## 2024-01-03 ENCOUNTER — Other Ambulatory Visit: Payer: Self-pay

## 2024-01-03 ENCOUNTER — Other Ambulatory Visit: Payer: Self-pay | Admitting: Oncology

## 2024-01-03 ENCOUNTER — Other Ambulatory Visit (HOSPITAL_COMMUNITY): Payer: Self-pay

## 2024-01-03 DIAGNOSIS — E785 Hyperlipidemia, unspecified: Secondary | ICD-10-CM | POA: Insufficient documentation

## 2024-01-03 DIAGNOSIS — R42 Dizziness and giddiness: Secondary | ICD-10-CM | POA: Insufficient documentation

## 2024-01-03 DIAGNOSIS — I2692 Saddle embolus of pulmonary artery without acute cor pulmonale: Secondary | ICD-10-CM | POA: Insufficient documentation

## 2024-01-03 DIAGNOSIS — I10 Essential (primary) hypertension: Secondary | ICD-10-CM | POA: Insufficient documentation

## 2024-01-03 DIAGNOSIS — I82532 Chronic embolism and thrombosis of left popliteal vein: Secondary | ICD-10-CM | POA: Insufficient documentation

## 2024-01-03 DIAGNOSIS — Z7901 Long term (current) use of anticoagulants: Secondary | ICD-10-CM | POA: Insufficient documentation

## 2024-01-03 DIAGNOSIS — Z860100 Personal history of colon polyps, unspecified: Secondary | ICD-10-CM | POA: Insufficient documentation

## 2024-01-03 DIAGNOSIS — I82512 Chronic embolism and thrombosis of left femoral vein: Secondary | ICD-10-CM | POA: Insufficient documentation

## 2024-01-03 DIAGNOSIS — Z8619 Personal history of other infectious and parasitic diseases: Secondary | ICD-10-CM | POA: Insufficient documentation

## 2024-01-03 DIAGNOSIS — D709 Neutropenia, unspecified: Secondary | ICD-10-CM | POA: Insufficient documentation

## 2024-01-03 DIAGNOSIS — Z5111 Encounter for antineoplastic chemotherapy: Secondary | ICD-10-CM | POA: Insufficient documentation

## 2024-01-03 DIAGNOSIS — C184 Malignant neoplasm of transverse colon: Secondary | ICD-10-CM

## 2024-01-03 DIAGNOSIS — Z800 Family history of malignant neoplasm of digestive organs: Secondary | ICD-10-CM | POA: Insufficient documentation

## 2024-01-03 NOTE — Progress Notes (Signed)
 [facility] Clinical Social Work  Clinical Social Work was referred by Engineer, civil (consulting) for emotional support and Merchant navy officer.  Clinical Social Worker contacted patient by phone to offer support and assess for needs. Patient confirmed additional emotional support as a need.  Interventions: CSW assessed patient's emotional and social needs, patient has preference for peer support.  Cancer Center does not have support group at this time. Community Resources were needed to meet need.  CSW sent message to Cancer Services to see if peer mentors are available, patient unable to drive to [facility] for support groups. Referred patient to community resources: [facility] (Completed referral on this date via electronic form)  Provided education and assistance to client regarding Advanced Directives.  Provided education on support services available through cancer Center. Patient understands all supportive services (Clinical Social Work and Spiritual Care) are not billed to insurance.   Follow Up Plan:  Patient has direct contact if needed. CSW remains available to assist patient if needed.   Lizbeth Sprague, LCSW  Clinical Social Worker [facility]

## 2024-01-04 ENCOUNTER — Telehealth: Payer: Self-pay | Admitting: *Deleted

## 2024-01-04 DIAGNOSIS — C184 Malignant neoplasm of transverse colon: Secondary | ICD-10-CM

## 2024-01-04 NOTE — Telephone Encounter (Signed)
 Ms. Kirn called to report she wants a port-a-cath for her chemotherapy. Called [facility] and inquired if Dr. Mitzie Freund would be able to place port one day next week? Jon will send message to Dr. Donneta nurse. Referral placed and faxed. Ms. Munford made aware that referral sent to surgeon and she is not able to do it, we will reach out to IR.

## 2024-01-09 ENCOUNTER — Other Ambulatory Visit: Payer: Self-pay | Admitting: *Deleted

## 2024-01-09 ENCOUNTER — Other Ambulatory Visit: Payer: Self-pay | Admitting: Nurse Practitioner

## 2024-01-09 ENCOUNTER — Other Ambulatory Visit (HOSPITAL_COMMUNITY): Payer: Self-pay | Admitting: Surgery

## 2024-01-09 DIAGNOSIS — C184 Malignant neoplasm of transverse colon: Secondary | ICD-10-CM

## 2024-01-09 DIAGNOSIS — C186 Malignant neoplasm of descending colon: Secondary | ICD-10-CM

## 2024-01-09 NOTE — Progress Notes (Signed)
 PAC being placed on 7/08. Needs lab/flush/OV/chemo on 7/10 per Adrienne Ned, NP. High priority scheduling message sent.

## 2024-01-10 ENCOUNTER — Other Ambulatory Visit (HOSPITAL_COMMUNITY): Payer: Self-pay | Admitting: Student

## 2024-01-10 ENCOUNTER — Other Ambulatory Visit: Payer: Self-pay

## 2024-01-11 ENCOUNTER — Ambulatory Visit (HOSPITAL_COMMUNITY)
Admission: RE | Admit: 2024-01-11 | Discharge: 2024-01-11 | Disposition: A | Discharge status: Home / Self Care | Source: Ambulatory Visit | Attending: Surgery | Admitting: Surgery

## 2024-01-11 ENCOUNTER — Other Ambulatory Visit: Payer: Self-pay

## 2024-01-11 DIAGNOSIS — C186 Malignant neoplasm of descending colon: Secondary | ICD-10-CM | POA: Diagnosis present

## 2024-01-11 MED ORDER — MIDAZOLAM HCL 2 MG/2ML IJ SOLN
INTRAMUSCULAR | Status: AC
  Filled 2024-01-11: qty 2

## 2024-01-11 MED ORDER — SODIUM CHLORIDE 0.9 % IV SOLN: INTRAVENOUS | Status: DC

## 2024-01-11 MED ORDER — LIDOCAINE-EPINEPHRINE 1 %-1:100000 IJ SOLN
20.0000 mL | Freq: Once | INTRAMUSCULAR | Status: AC
  Administered 2024-01-11: 20 mL

## 2024-01-11 MED ORDER — HEPARIN SOD (PORK) LOCK FLUSH 100 UNIT/ML IV SOLN
500.0000 [IU] | Freq: Once | INTRAVENOUS | Status: AC
  Administered 2024-01-11: 500 [IU] via INTRAVENOUS

## 2024-01-11 MED ORDER — HEPARIN SOD (PORK) LOCK FLUSH 100 UNIT/ML IV SOLN
INTRAVENOUS | Status: AC
Start: 2024-01-11 — End: 2024-01-11
  Filled 2024-01-11: qty 5

## 2024-01-11 MED ORDER — LIDOCAINE-EPINEPHRINE 1 %-1:100000 IJ SOLN
INTRAMUSCULAR | Status: AC
  Filled 2024-01-11: qty 1

## 2024-01-11 MED ORDER — MIDAZOLAM HCL 2 MG/2ML IJ SOLN
INTRAMUSCULAR | Status: AC | PRN
  Administered 2024-01-11: 1 mg via INTRAVENOUS
  Administered 2024-01-11: .5 mg via INTRAVENOUS

## 2024-01-11 MED ORDER — FENTANYL CITRATE (PF) 100 MCG/2ML IJ SOLN
INTRAMUSCULAR | Status: AC
  Filled 2024-01-11: qty 2

## 2024-01-11 MED ORDER — FENTANYL CITRATE (PF) 100 MCG/2ML IJ SOLN
INTRAMUSCULAR | Status: AC | PRN
  Administered 2024-01-11: 25 ug via INTRAVENOUS
  Administered 2024-01-11: 50 ug via INTRAVENOUS

## 2024-01-11 NOTE — H&P (Signed)
 Chief Complaint: Patient was seen in consultation today for colon cancer   Procedure: Port-a-catheter placement  Referring Physician(s): Connor,Chelsea A / Olam MARLA Ned, NP (Heme/Onc)  Supervising Physician: Jennefer Rover  Patient Status: [facility], [place] - Out-pt  History of Present Illness: Adrienne Rogers is a 83 y.o. female diagnosed with colon cancer earlier this year after presenting to the ED with several months of abdominal pain. Colonoscopy revealed likely malignant obstructing tumor of the mid ascending colon and patient underwent segmental transverse colectomy. She has been following with Heme/Onc for treatment management and recently completed her 4th cycle of adjuvant therapy. She will need continued chemotherapy, but has poor peripheral access. Patient was subsequently referred to IR for port-a-catheter placement for continued treatments.  Patient resting in bed in no acute distress. States that she has been experiencing extreme fatigue, nausea/vomiting, constipation, intermittent vertigo that she attributes to her chemotherapy, as well as occasional abdominal pain and shortness of breath. She denies any fevers/chills or chest pain. NPO since midnight. All questions and concerns answered at the bedside.    Code Status: Full Code  Past Medical History:  Diagnosis Date   Asthma    COPD (chronic obstructive pulmonary disease) (HCC)    Depression    Hyperlipidemia    Hypertension    PTSD (post-traumatic stress disorder)     Past Surgical History:  Procedure Laterality Date   ABDOMINAL HYSTERECTOMY     1981   BACK SURGERY     ACDF 2005   BIOPSY  11/10/2021   Procedure: BIOPSY;  Surgeon: Rollin Dover, MD;  Location: [facility];  Service: Gastroenterology;;   CHOLECYSTECTOMY     ESOPHAGOGASTRODUODENOSCOPY N/A 11/10/2021   Procedure: ESOPHAGOGASTRODUODENOSCOPY (EGD);  Surgeon: Rollin Dover, MD;  Location: [facility];  Service: Gastroenterology;  Laterality: N/A;  IDA,  Melena   PARTIAL COLECTOMY N/A 08/03/2023   Procedure: OPEN PARTIAL COLECTOMY;  Surgeon: Signe Mitzie LABOR, MD;  Location: [facility];  Service: General;  Laterality: N/A;   right knee arthroscopy     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 Authorizing Provider  acetaminophen  (TYLENOL ) 500 MG tablet Take 2 tablets (1,000 mg total) by mouth every 8 (eight) hours as needed for mild pain (pain score 1-3). 08/09/23   Maczis, Michael M, PA-C  albuterol  (VENTOLIN  HFA) 108 (90 Base) MCG/ACT inhaler Inhale 2 puffs into the lungs every 6 (six) hours as needed for wheezing or shortness of breath. 08/09/23   Maczis, Michael M, PA-C  APIXABAN  (ELIQUIS ) VTE STARTER PACK (10MG  AND 5MG ) Take as directed on package: start with two-5mg  tablets twice daily for 7 days. On day 8, switch to one-5mg  tablet twice daily. Patient taking differently: Take 5 mg by mouth 2 (two) times daily. Take as directed on package: start with two-5mg  tablets twice daily for 7 days. On day 8, switch to one-5mg  tablet twice daily. 11/10/23   Darci Pore, MD  capecitabine  (XELODA ) 500 MG tablet Take 3 tablets (1,500 mg total) by mouth 2 (two) times daily after a meal. Take for 14 days, then hold for 7 days. Repeat every 21 days. Start cycle on 11/10/2023 11/01/23  Cloretta Arley NOVAK, MD  carvedilol  (COREG ) 25 MG tablet Take 0.5 tablets (12.5 mg total) by mouth 2 (two) times daily. Patient taking differently: Take 12.5 mg by mouth 2 (two) times daily. Taking 12.5 mg PO a day due to BP being low 11/10/21   Cindy Garnette POUR,  MD  Cholecalciferol  25 MCG (1000 UT) tablet Take 1,000 Units by mouth daily. 01/10/21   [provider]  docusate sodium  (COLACE) 100 MG capsule Take 1 capsule (100 mg total) by mouth 2 (two) times daily as needed for mild constipation. 08/09/23   Maczis, Michael M, PA-C  ferrous sulfate  325 (65 FE) MG tablet Take 325 mg by mouth daily with breakfast.    [provider]  fluticasone  (FLONASE) 50 MCG/ACT nasal spray Place 2 sprays into the nose daily. 12/02/23   [provider]  ketotifen  (ZADITOR ) 0.035 % ophthalmic solution Place 1 drop into both eyes in the morning and at bedtime.    [provider]  Meclizine HCl 25 MG CHEW Chew 25 mg by mouth as needed. Patient not taking: Reported on 01/02/2024 12/02/23   [provider]  methocarbamol  (ROBAXIN ) 500 MG tablet Take 1 tablet (500 mg total) by mouth every 6 (six) hours as needed for muscle spasms. Patient not taking: Reported on 01/02/2024 08/09/23   Maczis, Michael M, PA-C  pantoprazole  (PROTONIX ) 40 MG tablet Take 40 mg by mouth 2 (two) times daily before a meal.    [provider]  polyethylene glycol (MIRALAX  / GLYCOLAX ) 17 g packet Take 17 g by mouth 2 (two) times daily as needed. 08/09/23   Maczis, Michael M, PA-C  sertraline  (ZOLOFT ) 100 MG tablet Take 200 mg by mouth daily. 10/02/21   [provider]  simvastatin  (ZOCOR ) 40 MG tablet Take 40 mg by mouth at bedtime.     [provider]  spironolactone  (ALDACTONE ) 25 MG tablet Take 25 mg by mouth daily. 09/20/21   [provider]  Tiotropium Bromide  Monohydrate 2.5 MCG/ACT AERS Take 2 puffs by mouth daily. 08/13/22   [provider]  traZODone  (DESYREL ) 100 MG tablet Take 100 mg by mouth at bedtime.    [provider]  NAPOLEON INHUB 250-50 MCG/ACT AEPB Inhale 1 puff into the lungs 2 (two) times daily. 09/19/21   [provider]     No family history on file.  Social History   Socioeconomic  History   Marital status: Divorced    Spouse name: Not on file   Number of children: Not on file   Years of education: Not on file   Highest education level: Not on file  Occupational History   Not on file  Tobacco Use   Smoking status: Never   Smokeless tobacco: Never  Vaping Use   Vaping status: Never Used  Substance and Sexual Activity   Alcohol use: Not Currently   Drug use: Not Currently   Sexual activity: Not on file  Other Topics Concern   Not on file  Social History Narrative   Not on file   Social Drivers of Health   Financial Resource Strain: Low Risk  (07/21/2021)   Received from [facility]   Overall Financial Resource Strain (CARDIA)    Difficulty of Paying Living Expenses: Not very hard  Food Insecurity: No Food Insecurity (11/06/2023)   Hunger Vital Sign    Worried About Running Out of Food in the Last Year: Never true    Ran Out of Food in the Last  Year: Never true  Transportation Needs: No Transportation Needs (11/06/2023)   PRAPARE - Administrator, Civil Service (Medical): No    Lack of Transportation (Non-Medical): No  Physical Activity: Insufficiently Active (07/21/2021)   Received from [facility]   Exercise Vital Sign    On average, how many days per week do you engage in moderate to strenuous exercise (like a brisk walk)?: 1 day    On average, how many minutes do you engage in exercise at this level?: 10 min  Stress: Stress Concern Present (07/21/2021)   Received from [facility] of Occupational [facility] - Occupational Stress Questionnaire    Feeling of Stress : To some extent  Social Connections: Unknown (11/06/2023)   Social Connection and Isolation Panel    Frequency of Communication with Friends and Family: Three times a week    Frequency of Social Gatherings with Friends and Family: Three times a week    Attends Religious Services: More than 4 times per year    Active Member of Clubs or Organizations: No     Attends Banker Meetings: Never    Marital Status: Patient declined    Review of Systems  Constitutional:  Positive for fatigue.  Respiratory:  Positive for shortness of breath.   Gastrointestinal:  Positive for abdominal pain (intermittent), constipation, nausea and vomiting.  Denies any chest pain or fevers/chills. All other ROS negative.  Vital Signs: BP (!) 141/83   Pulse 64   Temp 98.6 F (37 C)   Resp 18   Ht 5' 5 (1.651 m)   Wt 172 lb (78 kg)   SpO2 96%   BMI 28.62 kg/m    Physical Exam Vitals reviewed.  Constitutional:      Appearance: Normal appearance.  HENT:     Head: Normocephalic and atraumatic.     Mouth/Throat:     Mouth: Mucous membranes are moist.     Pharynx: Oropharynx is clear.  Cardiovascular:     Rate and Rhythm: Normal rate and regular rhythm.     Heart sounds: Normal heart sounds.  Pulmonary:     Effort: Pulmonary effort is normal.     Breath sounds: Normal breath sounds.  Abdominal:     General: Abdomen is flat.     Palpations: Abdomen is soft.     Tenderness: There is no abdominal tenderness.  Musculoskeletal:        General: Normal range of motion.     Cervical back: Normal range of motion.  Skin:    General: Skin is warm and dry.  Neurological:     General: No focal deficit present.     Mental Status: She is alert and oriented to person, place, and time. Mental status is at baseline.  Psychiatric:        Mood and Affect: Mood normal.        Behavior: Behavior normal.        Judgment: Judgment normal.     Imaging: No results found.  Labs:  CBC: Recent Labs    11/17/23 1032 12/07/23 1124 12/15/23 1329 01/02/24 1309  WBC 4.4 4.0 4.9 4.5  HGB 11.5* 11.5* 11.5* 11.5*  HCT 35.2* 35.0* 34.4* 33.9*  PLT 201 185 208 208    COAGS: No results for input(s): INR, APTT in the last 8760 hours.  BMP: Recent Labs    11/17/23 1032 12/07/23 1124 12/15/23 1329 01/02/24 1309  NA 140 141 141 141  K 4.4  4.2  4.4 4.1  CL 105 104 106 105  CO2 22 23 22 24   GLUCOSE 110* 166* 109* 165*  BUN 30* 25* 24* 23  CALCIUM  10.3 10.5* 10.0 10.3  CREATININE 1.60* 1.46* 1.35* 1.63*  GFRNONAA 32* 36* 39* 31*    LIVER FUNCTION TESTS: Recent Labs    11/17/23 1032 12/07/23 1124 12/15/23 1329 01/02/24 1309  BILITOT 0.4 0.3 0.3 0.4  AST 20 19 21 21   ALT 17 15 16 17   ALKPHOS 103 107 111 120  PROT 7.3 7.3 7.3 7.5  ALBUMIN 4.6 4.3 4.4 4.2    TUMOR MARKERS: Recent Labs    08/31/23 1102 10/19/23 1025 11/17/23 1032 12/07/23 1124  CEA 4.56 5.26* 3.39 3.78    Assessment and Plan:  Stage IIIb Transverse Colon Cancer: Adrienne Rogers is a 83 y.o. female with a history of colon cancer who is currently undergoing chemotherapy. Patient noted to have poor peripheral access and referred to [facility] IR for an image-guided port-a-catheter placement. Procedure to be performed under moderate sedation.  Risks and benefits of image guided port-a-catheter placement was discussed with the patient including, but not limited to bleeding, infection, pneumothorax, or fibrin sheath development and need for additional procedures.  All of the patient's questions were answered, patient is agreeable to proceed. Consent signed and in chart.   Thank you for this interesting consult. I greatly enjoyed meeting Adrienne Rogers and look forward to participating in their care. A copy of this report was sent to the requesting provider on this date.  Electronically Signed: Glennon CHRISTELLA Bal, PA-C 01/11/2024, 11:36 AM   I spent a total of  15 Minutes in face to face clinical consultation, greater than 50% of which was counseling/coordinating care for port-a-catheter placement.

## 2024-01-11 NOTE — Progress Notes (Signed)
 Pharmacist Chemotherapy Monitoring - Initial Assessment    Anticipated start date: 01/12/24   The following has been reviewed per standard work regarding the patient's treatment regimen: The patient's diagnosis, treatment plan and drug doses, and organ/hematologic function Lab orders and baseline tests specific to treatment regimen  The treatment plan start date, drug sequencing, and pre-medications Prior authorization status  Patient's documented medication list, including drug-drug interaction screen and prescriptions for anti-emetics and supportive care specific to the treatment regimen The drug concentrations, fluid compatibility, administration routes, and timing of the medications to be used The patient's access for treatment and lifetime cumulative dose history, if applicable  The patient's medication allergies and previous infusion related reactions, if applicable   Changes made to treatment plan:  N/A  Follow up needed:  N/A   Adrienne Rogers, RPH, 01/11/2024  1:43 PM

## 2024-01-12 ENCOUNTER — Inpatient Hospital Stay

## 2024-01-12 ENCOUNTER — Ambulatory Visit

## 2024-01-12 ENCOUNTER — Encounter: Payer: Self-pay | Admitting: Nurse Practitioner

## 2024-01-12 ENCOUNTER — Inpatient Hospital Stay (HOSPITAL_BASED_OUTPATIENT_CLINIC_OR_DEPARTMENT_OTHER): Admitting: Nurse Practitioner

## 2024-01-12 ENCOUNTER — Other Ambulatory Visit: Payer: Self-pay | Admitting: *Deleted

## 2024-01-12 VITALS — BP 131/87 | HR 70 | Temp 97.6°F | Resp 18 | Ht 65.0 in | Wt 175.6 lb

## 2024-01-12 DIAGNOSIS — Z8619 Personal history of other infectious and parasitic diseases: Secondary | ICD-10-CM | POA: Diagnosis not present

## 2024-01-12 DIAGNOSIS — C184 Malignant neoplasm of transverse colon: Secondary | ICD-10-CM

## 2024-01-12 DIAGNOSIS — Z95828 Presence of other vascular implants and grafts: Secondary | ICD-10-CM

## 2024-01-12 DIAGNOSIS — Z5111 Encounter for antineoplastic chemotherapy: Secondary | ICD-10-CM | POA: Diagnosis present

## 2024-01-12 DIAGNOSIS — I2692 Saddle embolus of pulmonary artery without acute cor pulmonale: Secondary | ICD-10-CM | POA: Diagnosis not present

## 2024-01-12 DIAGNOSIS — I82532 Chronic embolism and thrombosis of left popliteal vein: Secondary | ICD-10-CM | POA: Diagnosis not present

## 2024-01-12 DIAGNOSIS — D709 Neutropenia, unspecified: Secondary | ICD-10-CM | POA: Diagnosis not present

## 2024-01-12 DIAGNOSIS — I82512 Chronic embolism and thrombosis of left femoral vein: Secondary | ICD-10-CM | POA: Diagnosis not present

## 2024-01-12 DIAGNOSIS — R42 Dizziness and giddiness: Secondary | ICD-10-CM | POA: Diagnosis not present

## 2024-01-12 DIAGNOSIS — Z800 Family history of malignant neoplasm of digestive organs: Secondary | ICD-10-CM | POA: Diagnosis not present

## 2024-01-12 DIAGNOSIS — Z7901 Long term (current) use of anticoagulants: Secondary | ICD-10-CM | POA: Diagnosis not present

## 2024-01-12 DIAGNOSIS — E785 Hyperlipidemia, unspecified: Secondary | ICD-10-CM | POA: Diagnosis not present

## 2024-01-12 DIAGNOSIS — Z860100 Personal history of colon polyps, unspecified: Secondary | ICD-10-CM | POA: Diagnosis not present

## 2024-01-12 DIAGNOSIS — I10 Essential (primary) hypertension: Secondary | ICD-10-CM | POA: Diagnosis not present

## 2024-01-12 LAB — CBC WITH DIFFERENTIAL (CANCER CENTER ONLY)
Abs Immature Granulocytes: 0.02 K/uL (ref 0.00–0.07)
Basophils Absolute: 0 K/uL (ref 0.0–0.1)
Basophils Relative: 1 %
Eosinophils Absolute: 0.2 K/uL (ref 0.0–0.5)
Eosinophils Relative: 4 %
HCT: 33.9 % — ABNORMAL LOW (ref 36.0–46.0)
Hemoglobin: 11.1 g/dL — ABNORMAL LOW (ref 12.0–15.0)
Immature Granulocytes: 0 %
Lymphocytes Relative: 27 %
Lymphs Abs: 1.4 K/uL (ref 0.7–4.0)
MCH: 32 pg (ref 26.0–34.0)
MCHC: 32.7 g/dL (ref 30.0–36.0)
MCV: 97.7 fL (ref 80.0–100.0)
Monocytes Absolute: 0.5 K/uL (ref 0.1–1.0)
Monocytes Relative: 9 %
Neutro Abs: 3.1 K/uL (ref 1.7–7.7)
Neutrophils Relative %: 59 %
Platelet Count: 210 K/uL (ref 150–400)
RBC: 3.47 MIL/uL — ABNORMAL LOW (ref 3.87–5.11)
RDW: 16.4 % — ABNORMAL HIGH (ref 11.5–15.5)
WBC Count: 5.2 K/uL (ref 4.0–10.5)
nRBC: 0 % (ref 0.0–0.2)

## 2024-01-12 LAB — CMP (CANCER CENTER ONLY)
ALT: 19 U/L (ref 0–44)
AST: 21 U/L (ref 15–41)
Albumin: 4.2 g/dL (ref 3.5–5.0)
Alkaline Phosphatase: 113 U/L (ref 38–126)
Anion gap: 12 (ref 5–15)
BUN: 26 mg/dL — ABNORMAL HIGH (ref 8–23)
CO2: 22 mmol/L (ref 22–32)
Calcium: 9.8 mg/dL (ref 8.9–10.3)
Chloride: 106 mmol/L (ref 98–111)
Creatinine: 1.28 mg/dL — ABNORMAL HIGH (ref 0.44–1.00)
GFR, Estimated: 42 mL/min — ABNORMAL LOW (ref >60)
Glucose, Bld: 114 mg/dL — ABNORMAL HIGH (ref 70–99)
Potassium: 4.1 mmol/L (ref 3.5–5.1)
Sodium: 140 mmol/L (ref 135–145)
Total Bilirubin: 0.4 mg/dL (ref 0.0–1.2)
Total Protein: 7.1 g/dL (ref 6.5–8.1)

## 2024-01-12 MED ORDER — PROCHLORPERAZINE MALEATE 10 MG PO TABS
5.0000 mg | ORAL_TABLET | Freq: Once | ORAL | Status: AC
  Administered 2024-01-12: 5 mg via ORAL
  Filled 2024-01-12: qty 1

## 2024-01-12 MED ORDER — SODIUM CHLORIDE 0.9% FLUSH
10.0000 mL | Freq: Once | INTRAVENOUS | Status: AC
  Administered 2024-01-12: 10 mL via INTRAVENOUS

## 2024-01-12 MED ORDER — SODIUM CHLORIDE 0.9 % IV SOLN: INTRAVENOUS | Status: DC

## 2024-01-12 MED ORDER — SODIUM CHLORIDE 0.9% FLUSH
10.0000 mL | INTRAVENOUS | Status: DC | PRN
  Administered 2024-01-12: 10 mL

## 2024-01-12 MED ORDER — HEPARIN SOD (PORK) LOCK FLUSH 100 UNIT/ML IV SOLN
500.0000 [IU] | Freq: Once | INTRAVENOUS | Status: AC | PRN
  Administered 2024-01-12: 500 [IU]

## 2024-01-12 MED ORDER — FLUOROURACIL CHEMO INJECTION 2.5 GM/50ML
500.0000 mg/m2 | Freq: Once | INTRAVENOUS | Status: AC
  Administered 2024-01-12: 1000 mg via INTRAVENOUS
  Filled 2024-01-12: qty 20

## 2024-01-12 MED ORDER — LEUCOVORIN CALCIUM INJECTION 350 MG
400.0000 mg/m2 | Freq: Once | INTRAVENOUS | Status: AC
  Administered 2024-01-12: 740 mg via INTRAVENOUS
  Filled 2024-01-12: qty 37

## 2024-01-12 NOTE — Patient Instructions (Signed)
 [facility] - A DEPT OF [facility]  Discharge Instructions: Thank you for choosing [facility] to provide your oncology and hematology care.   If you have a lab appointment with the Cancer Center, please go directly to the Cancer Center and check in at the registration area.   Wear comfortable clothing and clothing appropriate for easy access to any Portacath or PICC line.   We strive to give you quality time with your provider. You may need to reschedule your appointment if you arrive late (15 or more minutes).  Arriving late affects you and other patients whose appointments are after yours.  Also, if you miss three or more appointments without notifying the office, you may be dismissed from the clinic at the provider's discretion.      For prescription refill requests, have your pharmacy contact our office and allow 72 hours for refills to be completed.    Today you received the following chemotherapy and/or immunotherapy agents: Fluorouracil , leucovorin       To help prevent nausea and vomiting after your treatment, we encourage you to take your nausea medication as directed.  BELOW ARE SYMPTOMS THAT SHOULD BE REPORTED IMMEDIATELY: *FEVER GREATER THAN 100.4 F (38 C) OR HIGHER *CHILLS OR SWEATING *NAUSEA AND VOMITING THAT IS NOT CONTROLLED WITH YOUR NAUSEA MEDICATION *UNUSUAL SHORTNESS OF BREATH *UNUSUAL BRUISING OR BLEEDING *URINARY PROBLEMS (pain or burning when urinating, or frequent urination) *BOWEL PROBLEMS (unusual diarrhea, constipation, pain near the anus) TENDERNESS IN MOUTH AND THROAT WITH OR WITHOUT PRESENCE OF ULCERS (sore throat, sores in mouth, or a toothache) UNUSUAL RASH, SWELLING OR PAIN  UNUSUAL VAGINAL DISCHARGE OR ITCHING   Items with * indicate a potential emergency and should be followed up as soon as possible or go to the Emergency Department if any problems should occur.  Please show the CHEMOTHERAPY ALERT CARD or  IMMUNOTHERAPY ALERT CARD at check-in to the Emergency Department and triage nurse.  Should you have questions after your visit or need to cancel or reschedule your appointment, please contact [facility] - A DEPT OF [facility]  Dept: [phone]  and follow the prompts.  Office hours are 8:00 a.m. to 4:30 p.m. Monday - Friday. Please note that voicemails left after 4:00 p.m. may not be returned until the following business day.  We are closed weekends and major holidays. You have access to a nurse at all times for urgent questions. Please call the main number to the clinic Dept: [phone] and follow the prompts.   For any non-urgent questions, you may also contact your provider using MyChart. We now offer e-Visits for anyone 31 and older to request care online for non-urgent symptoms. For details visit [email or web address].   Also download the MyChart app! Go to the app store, search MyChart, open the app, select [place], and log in with your MyChart username and password.  _________________________________________________________  Fluorouracil  Injection What is this medication? FLUOROURACIL  (flure oh YOOR a sil) treats some types of cancer. It works by slowing down the growth of cancer cells. This medicine may be used for other purposes; ask your health care provider or pharmacist if you have questions. COMMON BRAND NAME(S): Adrucil  What should I tell my care team before I take this medication? They need to know if you have any of these conditions: Blood disorders Dihydropyrimidine dehydrogenase (DPD) deficiency Infection, such as chickenpox, cold sores, herpes Kidney disease Liver disease Poor nutrition Recent  or ongoing radiation therapy An unusual or allergic reaction to fluorouracil , other medications, foods, dyes, or preservatives If you or your partner are pregnant or trying to get pregnant Breast-feeding How should I use this  medication? This medication is injected into a vein. It is administered by your care team in a hospital or clinic setting. Talk to your care team about the use of this medication in children. Special care may be needed. Overdosage: If you think you have taken too much of this medicine contact a poison control center or emergency room at once. NOTE: This medicine is only for you. Do not share this medicine with others. What if I miss a dose? Keep appointments for follow-up doses. It is important not to miss your dose. Call your care team if you are unable to keep an appointment. What may interact with this medication? Do not take this medication with any of the following: Live virus vaccines This medication may also interact with the following: Medications that treat or prevent blood clots, such as warfarin, enoxaparin , dalteparin This list may not describe all possible interactions. Give your health care provider a list of all the medicines, herbs, non-prescription drugs, or dietary supplements you use. Also tell them if you smoke, drink alcohol, or use illegal drugs. Some items may interact with your medicine. What should I watch for while using this medication? Your condition will be monitored carefully while you are receiving this medication. This medication may make you feel generally unwell. This is not uncommon as chemotherapy can affect healthy cells as well as cancer cells. Report any side effects. Continue your course of treatment even though you feel ill unless your care team tells you to stop. In some cases, you may be given additional medications to help with side effects. Follow all directions for their use. This medication may increase your risk of getting an infection. Call your care team for advice if you get a fever, chills, sore throat, or other symptoms of a cold or flu. Do not treat yourself. Try to avoid being around people who are sick. This medication may increase your risk  to bruise or bleed. Call your care team if you notice any unusual bleeding. Be careful brushing or flossing your teeth or using a toothpick because you may get an infection or bleed more easily. If you have any dental work done, tell your dentist you are receiving this medication. Avoid taking medications that contain aspirin , acetaminophen , ibuprofen, naproxen, or ketoprofen unless instructed by your care team. These medications may hide a fever. Do not treat diarrhea with over the counter products. Contact your care team if you have diarrhea that lasts more than 2 days or if it is severe and watery. This medication can make you more sensitive to the sun. Keep out of the sun. If you cannot avoid being in the sun, wear protective clothing and sunscreen. Do not use sun lamps, tanning beds, or tanning booths. Talk to your care team if you or your partner wish to become pregnant or think you might be pregnant. This medication can cause serious birth defects if taken during pregnancy and for 3 months after the last dose. A reliable form of contraception is recommended while taking this medication and for 3 months after the last dose. Talk to your care team about effective forms of contraception. Do not father a child while taking this medication and for 3 months after the last dose. Use a condom while having sex during this time  period. Do not breastfeed while taking this medication. This medication may cause infertility. Talk to your care team if you are concerned about your fertility. What side effects may I notice from receiving this medication? Side effects that you should report to your care team as soon as possible: Allergic reactions--skin rash, itching, hives, swelling of the face, lips, tongue, or throat Heart attack--pain or tightness in the chest, shoulders, arms, or jaw, nausea, shortness of breath, cold or clammy skin, feeling faint or lightheaded Heart failure--shortness of breath, swelling of  the ankles, feet, or hands, sudden weight gain, unusual weakness or fatigue Heart rhythm changes--fast or irregular heartbeat, dizziness, feeling faint or lightheaded, chest pain, trouble breathing High ammonia level--unusual weakness or fatigue, confusion, loss of appetite, nausea, vomiting, seizures Infection--fever, chills, cough, sore throat, wounds that don't heal, pain or trouble when passing urine, general feeling of discomfort or being unwell Low red blood cell level--unusual weakness or fatigue, dizziness, headache, trouble breathing Pain, tingling, or numbness in the hands or feet, muscle weakness, change in vision, confusion or trouble speaking, loss of balance or coordination, trouble walking, seizures Redness, swelling, and blistering of the skin over hands and feet Severe or prolonged diarrhea Unusual bruising or bleeding Side effects that usually do not require medical attention (report to your care team if they continue or are bothersome): Dry skin Headache Increased tears Nausea Pain, redness, or swelling with sores inside the mouth or throat Sensitivity to light Vomiting This list may not describe all possible side effects. Call your doctor for medical advice about side effects. You may report side effects to FDA at 1-800-FDA-1088. Where should I keep my medication? This medication is given in a hospital or clinic. It will not be stored at home. NOTE: This sheet is a summary. It may not cover all possible information. If you have questions about this medicine, talk to your doctor, pharmacist, or health care provider.  2024 Elsevier/Gold Standard (2021-10-27 00:00:00) _______________________________________________  Leucovorin  Injection What is this medication? LEUCOVORIN  (loo koe VOR in) prevents side effects from certain medications, such as methotrexate. It works by increasing folate levels. This helps protect healthy cells in your body. It may also be used to treat  anemia caused by low levels of folate. It can also be used with fluorouracil , a type of chemotherapy, to treat colorectal cancer. It works by increasing the effects of fluorouracil  in the body. This medicine may be used for other purposes; ask your health care provider or pharmacist if you have questions. What should I tell my care team before I take this medication? They need to know if you have any of these conditions: Anemia from low levels of vitamin B12 in the blood An unusual or allergic reaction to leucovorin , folic acid, other medications, foods, dyes, or preservatives Pregnant or trying to get pregnant Breastfeeding How should I use this medication? This medication is injected into a vein or a muscle. It is given by your care team in a hospital or clinic setting. Talk to your care team about the use of this medication in children. Special care may be needed. Overdosage: If you think you have taken too much of this medicine contact a poison control center or emergency room at once. NOTE: This medicine is only for you. Do not share this medicine with others. What if I miss a dose? Keep appointments for follow-up doses. It is important not to miss your dose. Call your care team if you are unable to keep an  appointment. What may interact with this medication? Capecitabine  Fluorouracil  Phenobarbital Phenytoin Primidone Trimethoprim;sulfamethoxazole This list may not describe all possible interactions. Give your health care provider a list of all the medicines, herbs, non-prescription drugs, or dietary supplements you use. Also tell them if you smoke, drink alcohol, or use illegal drugs. Some items may interact with your medicine. What should I watch for while using this medication? Your condition will be monitored carefully while you are receiving this medication. This medication may increase the side effects of 5-fluorouracil . Tell your care team if you have diarrhea or mouth sores that  do not get better or that get worse. What side effects may I notice from receiving this medication? Side effects that you should report to your care team as soon as possible: Allergic reactions--skin rash, itching, hives, swelling of the face, lips, tongue, or throat This list may not describe all possible side effects. Call your doctor for medical advice about side effects. You may report side effects to FDA at 1-800-FDA-1088. Where should I keep my medication? This medication is given in a hospital or clinic. It will not be stored at home. NOTE: This sheet is a summary. It may not cover all possible information. If you have questions about this medicine, talk to your doctor, pharmacist, or health care provider.  2024 Elsevier/Gold Standard (2021-11-24 00:00:00)

## 2024-01-12 NOTE — Progress Notes (Signed)
 Patient seen by Lonna Cobb NP today  Vitals are within treatment parameters:Yes   Labs are within treatment parameters: Yes   Treatment plan has been signed: Yes   Per physician team, Patient is ready for treatment and there are NO modifications to the treatment plan.

## 2024-01-12 NOTE — Progress Notes (Signed)
 [facility] OFFICE PROGRESS NOTE   Diagnosis: Colon cancer  INTERVAL HISTORY:   Adrienne Rogers returns as scheduled.  She is scheduled to begin a cycle of 5-FU/leucovorin  per the [facility] regimen today.  A Port-A-Cath was placed yesterday.  She has very occasional mild dizziness.  No nausea or vomiting.  No mouth sores.  No diarrhea.  No hand or foot pain or redness.  Objective:  Vital signs in last 24 hours:  Blood pressure 131/87, pulse 70, temperature 97.6 F (36.4 C), temperature source Temporal, resp. rate 18, height 5' 5 (1.651 m), weight 175 lb 9.6 oz (79.7 kg), SpO2 98%.    HEENT: No thrush or ulcers. Resp: Lungs clear bilaterally. Cardio: Regular rate and rhythm. GI: Abdomen soft and nontender.  No hepatosplenomegaly. Vascular: No leg edema. Skin: Palms without erythema. Port-A-Cath without erythema.  Lab Results:  Lab Results  Component Value Date   WBC 5.2 01/12/2024   HGB 11.1 (L) 01/12/2024   HCT 33.9 (L) 01/12/2024   MCV 97.7 01/12/2024   PLT 210 01/12/2024   NEUTROABS 3.1 01/12/2024    Imaging:  IR IMAGING GUIDED PORT INSERTION Result Date: 01/11/2024 INDICATION: Colon cancer EXAM: IMPLANTED PORT A CATH PLACEMENT WITH ULTRASOUND AND FLUOROSCOPIC GUIDANCE MEDICATIONS: The antibiotic was administered within an appropriate time interval prior to skin puncture. ANESTHESIA/SEDATION: Versed  1.5 mg IV; Fentanyl  50 mcg IV; Moderate Sedation Time:  25 The patient was continuously monitored during the procedure by the interventional radiology nurse under my direct supervision. FLUOROSCOPY: Six seconds (1 mGy) COMPLICATIONS: None immediate. PROCEDURE: The right neck and chest was prepped with chlorhexidine , and draped in the usual sterile fashion using maximum barrier technique (cap and mask, sterile gown, sterile gloves, large sterile sheet, hand hygiene and cutaneous antiseptic). Local anesthesia was attained by infiltration with 1% lidocaine . Ultrasound  demonstrated patency of the right internal jugular vein, and this was documented with an image. Under real-time ultrasound guidance, this vein was accessed with a 21 gauge micropuncture needle and image documentation was performed. A small dermatotomy was made at the access site with an 11 scalpel. A 0.018 wire was advanced into the SVC and the access needle exchanged for a 77F micropuncture vascular sheath. The 0.018 wire was then removed and a 0.035 wire advanced into the IVC. An appropriate location for the subcutaneous reservoir was selected below the clavicle and an incision was made through the skin and underlying soft tissues. The subcutaneous tissues were then dissected using a combination of blunt and sharp surgical technique and a pocket was formed. A single lumen power injectable portacatheter was then tunneled through the subcutaneous tissues from the pocket to the dermatotomy and the port reservoir placed within the subcutaneous pocket. The venous access site was then serially dilated and a peel away vascular sheath placed over the wire. The wire was removed and the port catheter advanced into position under fluoroscopic guidance. The catheter tip is positioned in the superior cavoatrial junction. This was documented with a spot image. The portacatheter was then tested and found to flush and aspirate well. The port was flushed with saline followed by 100 units/mL heparinized saline. The pocket was then closed in two layers using first subdermal inverted interrupted absorbable sutures followed by a running subcuticular suture. The epidermis was then sealed with Dermabond. The dermatotomy at the venous access site was also closed with Dermabond. IMPRESSION: Successful placement of a right IJ approach Power Port with ultrasound and fluoroscopic guidance. The catheter is ready for  use. Electronically Signed   By: Maude Naegeli M.D.   On: 01/11/2024 12:54    Medications: I have reviewed the patient's  current medications.  Assessment/Plan: Transverse colon cancer, stage IIIb (pT3,pN1b,cM0), status post a segmental transverse colectomy 08/03/2023 CT abdomen/pelvis 07/22/2023: Proximal colonic distention with transition point in the transverse colon, 4 cm lesion of the distal transverse colon with evidence of transmural extension/serosal component, small lymph nodes in the transverse mesocolon, indeterminate 1.9 cm left adrenal nodule Colonoscopy 07/22/2023: Mass in the ascending colon biopsy-invasive moderately differentiated adenocarcinoma CT chest 08/02/2023: No evidence of metastatic disease, indeterminate left adrenal nodule Segmental transverse colectomy and colocolonic anastomosis 08/03/2023-moderately differentiated adenocarcinoma of the transverse colon, tumor extends into pericolonic adipose tissue, lymphovascular invasion present, 2/13 nodes Mismatch repair protein expression intact, MSI stable Elevated preoperative CEA 08/02/2023 (42.7) 08/31/2023 CEA normal (4.56) Cycle 1 capecitabine  09/08/2023 Cycle 2 capecitabine  09/29/2023 Cycle 3 capecitabine  10/20/2023, capecitabine  dose reduced to 1000 mg twice daily Cycle 4 capecitabine  11/18/2023, capecitabine  dose reduced to 1000 mg twice daily Cycle 5 capecitabine  12/16/2023, capecitabine  dose reduced to 1000 mg twice daily, discontinued day 8 due to recurrent dizziness Week one 5-FU/leucovorin  per the [place] regimen 01/12/2024 Indeterminate left adrenal nodule on CT abdomen/pelvis 07/22/2023 History of colonic polyps Hypertension For lipidemia Asthma History of H. 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  She has completed 4+ cycles of adjuvant capecitabine .  Capecitabine  was discontinued during cycle 5 due to recurrent dizziness.  She would like to proceed with 6 weeks of weekly 5-FU/leucovorin  per the [place] regimen.  We again reviewed potential toxicities.  She understands the dizziness could recur with 5-FU/leucovorin .  She agrees to proceed.  CBC and chemistry panel reviewed.  Labs adequate for treatment.  She will return for follow-up and week two 5-FU/leucovorin  in 1 week.  We are available to see her sooner if needed.    Olam Ned ANP/GNP-BC   01/12/2024  11:55 AM

## 2024-01-13 ENCOUNTER — Telehealth: Payer: Self-pay

## 2024-01-13 ENCOUNTER — Other Ambulatory Visit: Payer: Self-pay

## 2024-01-13 NOTE — Telephone Encounter (Signed)
 First chemo callback  [facility] asking her to return my call if she has any questions or concerns (can reach the answering service after hours). If she is doing well, no need to return the call. Provided clinic phone number.

## 2024-01-14 ENCOUNTER — Other Ambulatory Visit: Payer: Self-pay

## 2024-01-15 ENCOUNTER — Other Ambulatory Visit: Payer: Self-pay | Admitting: Oncology

## 2024-01-16 ENCOUNTER — Other Ambulatory Visit

## 2024-01-16 ENCOUNTER — Other Ambulatory Visit: Payer: Self-pay

## 2024-01-16 ENCOUNTER — Ambulatory Visit: Admitting: Nurse Practitioner

## 2024-01-18 ENCOUNTER — Inpatient Hospital Stay

## 2024-01-18 ENCOUNTER — Encounter: Payer: Self-pay | Admitting: Nurse Practitioner

## 2024-01-18 ENCOUNTER — Other Ambulatory Visit: Payer: Self-pay | Admitting: Nurse Practitioner

## 2024-01-18 ENCOUNTER — Inpatient Hospital Stay (HOSPITAL_BASED_OUTPATIENT_CLINIC_OR_DEPARTMENT_OTHER): Admitting: Nurse Practitioner

## 2024-01-18 VITALS — BP 131/77 | HR 80 | Temp 98.0°F | Resp 16 | Ht 65.0 in | Wt 179.6 lb

## 2024-01-18 DIAGNOSIS — C184 Malignant neoplasm of transverse colon: Secondary | ICD-10-CM

## 2024-01-18 DIAGNOSIS — Z95828 Presence of other vascular implants and grafts: Secondary | ICD-10-CM

## 2024-01-18 DIAGNOSIS — Z5111 Encounter for antineoplastic chemotherapy: Secondary | ICD-10-CM | POA: Diagnosis not present

## 2024-01-18 LAB — CBC WITH DIFFERENTIAL (CANCER CENTER ONLY)
Abs Immature Granulocytes: 0.01 K/uL (ref 0.00–0.07)
Basophils Absolute: 0.1 K/uL (ref 0.0–0.1)
Basophils Relative: 1 %
Eosinophils Absolute: 0.1 K/uL (ref 0.0–0.5)
Eosinophils Relative: 3 %
HCT: 31.1 % — ABNORMAL LOW (ref 36.0–46.0)
Hemoglobin: 10.2 g/dL — ABNORMAL LOW (ref 12.0–15.0)
Immature Granulocytes: 0 %
Lymphocytes Relative: 40 %
Lymphs Abs: 1.7 K/uL (ref 0.7–4.0)
MCH: 31.9 pg (ref 26.0–34.0)
MCHC: 32.8 g/dL (ref 30.0–36.0)
MCV: 97.2 fL (ref 80.0–100.0)
Monocytes Absolute: 0.2 K/uL (ref 0.1–1.0)
Monocytes Relative: 6 %
Neutro Abs: 2.1 K/uL (ref 1.7–7.7)
Neutrophils Relative %: 50 %
Platelet Count: 189 K/uL (ref 150–400)
RBC: 3.2 MIL/uL — ABNORMAL LOW (ref 3.87–5.11)
RDW: 15.5 % (ref 11.5–15.5)
WBC Count: 4.2 K/uL (ref 4.0–10.5)
nRBC: 0 % (ref 0.0–0.2)

## 2024-01-18 LAB — CMP (CANCER CENTER ONLY)
ALT: 19 U/L (ref 0–44)
AST: 19 U/L (ref 15–41)
Albumin: 4.1 g/dL (ref 3.5–5.0)
Alkaline Phosphatase: 93 U/L (ref 38–126)
Anion gap: 11 (ref 5–15)
BUN: 27 mg/dL — ABNORMAL HIGH (ref 8–23)
CO2: 22 mmol/L (ref 22–32)
Calcium: 9.5 mg/dL (ref 8.9–10.3)
Chloride: 108 mmol/L (ref 98–111)
Creatinine: 1.4 mg/dL — ABNORMAL HIGH (ref 0.44–1.00)
GFR, Estimated: 38 mL/min — ABNORMAL LOW (ref >60)
Glucose, Bld: 131 mg/dL — ABNORMAL HIGH (ref 70–99)
Potassium: 4.3 mmol/L (ref 3.5–5.1)
Sodium: 140 mmol/L (ref 135–145)
Total Bilirubin: 0.3 mg/dL (ref 0.0–1.2)
Total Protein: 6.8 g/dL (ref 6.5–8.1)

## 2024-01-18 MED ORDER — HEPARIN SOD (PORK) LOCK FLUSH 100 UNIT/ML IV SOLN
500.0000 [IU] | INTRAVENOUS | Status: AC | PRN
  Administered 2024-01-18: 500 [IU]

## 2024-01-18 MED ORDER — SODIUM CHLORIDE 0.9% FLUSH
10.0000 mL | INTRAVENOUS | Status: AC | PRN
  Administered 2024-01-18: 10 mL

## 2024-01-18 NOTE — Progress Notes (Signed)
  [facility] OFFICE PROGRESS NOTE   Diagnosis: Colon cancer  INTERVAL HISTORY:   Adrienne Rogers returns as scheduled.  She completed week one 5-FU/leucovorin  per the [place] regimen 01/12/2024.  She denies nausea/vomiting.  No mouth sores.  No diarrhea.  No hand or foot pain or redness.  No dizziness.  Objective:  Vital signs in last 24 hours:  Blood pressure 131/77, pulse 80, temperature 98 F (36.7 C), temperature source Temporal, resp. rate 16, height 5' 5 (1.651 m), weight 179 lb 9.6 oz (81.5 kg), SpO2 100%.    HEENT: No thrush or ulcers.   Resp: Lungs clear bilaterally. Cardio: Regular rate and rhythm. GI: No hepatosplenomegaly. Vascular: No leg edema. Skin: Palms without erythema. Port-A-Cath without erythema.  Lab Results:  Lab Results  Component Value Date   WBC 4.2 01/18/2024   HGB 10.2 (L) 01/18/2024   HCT 31.1 (L) 01/18/2024   MCV 97.2 01/18/2024   PLT 189 01/18/2024   NEUTROABS 2.1 01/18/2024    Imaging:  No results found.  Medications: I have reviewed the patient's current medications.  Assessment/Plan: Transverse colon cancer, stage IIIb (pT3,pN1b,cM0), status post a segmental transverse colectomy 08/03/2023 CT abdomen/pelvis 07/22/2023: Proximal colonic distention with transition point in the transverse colon, 4 cm lesion of the distal transverse colon with evidence of transmural extension/serosal component, small lymph nodes in the transverse mesocolon, indeterminate 1.9 cm left adrenal nodule Colonoscopy 07/22/2023: Mass in the ascending colon biopsy-invasive moderately differentiated adenocarcinoma CT chest 08/02/2023: No evidence of metastatic disease, indeterminate left adrenal nodule Segmental transverse colectomy and colocolonic anastomosis 08/03/2023-moderately differentiated adenocarcinoma of the transverse colon, tumor extends into pericolonic adipose tissue, lymphovascular invasion present, 2/13 nodes Mismatch repair protein  expression intact, MSI stable Elevated preoperative CEA 08/02/2023 (42.7) 08/31/2023 CEA normal (4.56) Cycle 1 capecitabine  09/08/2023 Cycle 2 capecitabine  09/29/2023 Cycle 3 capecitabine  10/20/2023, capecitabine  dose reduced to 1000 mg twice daily Cycle 4 capecitabine  11/18/2023, capecitabine  dose reduced to 1000 mg twice daily Cycle 5 capecitabine  12/16/2023, capecitabine  dose reduced to 1000 mg twice daily, discontinued day 8 due to recurrent dizziness Week one 5-FU/leucovorin  per the [place] regimen 01/12/2024 Week two 5-FU/leucovorin  per the [place] regimen 01/19/2024 Indeterminate left adrenal nodule on CT abdomen/pelvis 07/22/2023 History of colonic polyps Hypertension For lipidemia Asthma History of H. 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  She has completed 1 week of 5-FU/leucovorin .  She tolerated well.  No recurrence of dizziness.  Plan to proceed with week 2 as scheduled 01/19/2024.  CBC and chemistry panel reviewed.  Labs adequate for treatment as above.  Will return for follow-up in week three 5-FU/leucovorin  in 1 week.  We are available to see her sooner if needed.    Olam Ned ANP/GNP-BC   01/18/2024  3:18 PM

## 2024-01-19 ENCOUNTER — Ambulatory Visit: Admitting: Oncology

## 2024-01-19 ENCOUNTER — Other Ambulatory Visit

## 2024-01-19 ENCOUNTER — Ambulatory Visit

## 2024-01-19 VITALS — BP 125/77 | HR 83 | Temp 97.9°F | Resp 18

## 2024-01-19 DIAGNOSIS — C184 Malignant neoplasm of transverse colon: Secondary | ICD-10-CM

## 2024-01-19 DIAGNOSIS — Z5111 Encounter for antineoplastic chemotherapy: Secondary | ICD-10-CM | POA: Diagnosis not present

## 2024-01-19 MED ORDER — HEPARIN SOD (PORK) LOCK FLUSH 100 UNIT/ML IV SOLN
500.0000 [IU] | Freq: Once | INTRAVENOUS | Status: AC | PRN
  Administered 2024-01-19: 500 [IU]

## 2024-01-19 MED ORDER — SODIUM CHLORIDE 0.9 % IV SOLN
INTRAVENOUS | Status: DC
Start: 2024-01-19 — End: 2024-01-19

## 2024-01-19 MED ORDER — FLUOROURACIL CHEMO INJECTION 2.5 GM/50ML
500.0000 mg/m2 | Freq: Once | INTRAVENOUS | Status: AC
  Administered 2024-01-19: 1000 mg via INTRAVENOUS
  Filled 2024-01-19: qty 20

## 2024-01-19 MED ORDER — PROCHLORPERAZINE MALEATE 10 MG PO TABS
5.0000 mg | ORAL_TABLET | Freq: Once | ORAL | Status: AC
  Administered 2024-01-19: 5 mg via ORAL
  Filled 2024-01-19: qty 1

## 2024-01-19 MED ORDER — SODIUM CHLORIDE 0.9% FLUSH
10.0000 mL | INTRAVENOUS | Status: DC | PRN
  Administered 2024-01-19: 10 mL

## 2024-01-19 MED ORDER — SODIUM CHLORIDE 0.9 % IV SOLN
400.0000 mg/m2 | Freq: Once | INTRAVENOUS | Status: AC
  Administered 2024-01-19: 740 mg via INTRAVENOUS
  Filled 2024-01-19: qty 37

## 2024-01-19 NOTE — Patient Instructions (Signed)
 [facility] - A DEPT OF [facility]  Discharge Instructions: Thank you for choosing [facility] to provide your oncology and hematology care.   If you have a lab appointment with the Cancer Center, please go directly to the Cancer Center and check in at the registration area.   Wear comfortable clothing and clothing appropriate for easy access to any Portacath or PICC line.   We strive to give you quality time with your provider. You may need to reschedule your appointment if you arrive late (15 or more minutes).  Arriving late affects you and other patients whose appointments are after yours.  Also, if you miss three or more appointments without notifying the office, you may be dismissed from the clinic at the provider's discretion.      For prescription refill requests, have your pharmacy contact our office and allow 72 hours for refills to be completed.    Today you received the following chemotherapy and/or immunotherapy agents: Leucovorin  and Fluorouracil  (5-FU)      To help prevent nausea and vomiting after your treatment, we encourage you to take your nausea medication as directed.  BELOW ARE SYMPTOMS THAT SHOULD BE REPORTED IMMEDIATELY: *FEVER GREATER THAN 100.4 F (38 C) OR HIGHER *CHILLS OR SWEATING *NAUSEA AND VOMITING THAT IS NOT CONTROLLED WITH YOUR NAUSEA MEDICATION *UNUSUAL SHORTNESS OF BREATH *UNUSUAL BRUISING OR BLEEDING *URINARY PROBLEMS (pain or burning when urinating, or frequent urination) *BOWEL PROBLEMS (unusual diarrhea, constipation, pain near the anus) TENDERNESS IN MOUTH AND THROAT WITH OR WITHOUT PRESENCE OF ULCERS (sore throat, sores in mouth, or a toothache) UNUSUAL RASH, SWELLING OR PAIN  UNUSUAL VAGINAL DISCHARGE OR ITCHING   Items with * indicate a potential emergency and should be followed up as soon as possible or go to the Emergency Department if any problems should occur.  Please show the CHEMOTHERAPY  ALERT CARD or IMMUNOTHERAPY ALERT CARD at check-in to the Emergency Department and triage nurse.  Should you have questions after your visit or need to cancel or reschedule your appointment, please contact [facility] - A DEPT OF [facility]  Dept: [phone]  and follow the prompts.  Office hours are 8:00 a.m. to 4:30 p.m. Monday - Friday. Please note that voicemails left after 4:00 p.m. may not be returned until the following business day.  We are closed weekends and major holidays. You have access to a nurse at all times for urgent questions. Please call the main number to the clinic Dept: [phone] and follow the prompts.   For any non-urgent questions, you may also contact your provider using MyChart. We now offer e-Visits for anyone 57 and older to request care online for non-urgent symptoms. For details visit [email or web address].   Also download the MyChart app! Go to the app store, search MyChart, open the app, select [place], and log in with your MyChart username and password.

## 2024-01-21 ENCOUNTER — Encounter

## 2024-01-21 ENCOUNTER — Other Ambulatory Visit: Payer: Self-pay | Admitting: Oncology

## 2024-01-26 ENCOUNTER — Inpatient Hospital Stay

## 2024-01-26 ENCOUNTER — Encounter: Payer: Self-pay | Admitting: Nurse Practitioner

## 2024-01-26 ENCOUNTER — Inpatient Hospital Stay (HOSPITAL_BASED_OUTPATIENT_CLINIC_OR_DEPARTMENT_OTHER): Admitting: Nurse Practitioner

## 2024-01-26 VITALS — BP 112/86 | HR 71 | Temp 97.9°F | Resp 15 | Ht 65.0 in | Wt 177.0 lb

## 2024-01-26 DIAGNOSIS — Z5111 Encounter for antineoplastic chemotherapy: Secondary | ICD-10-CM | POA: Diagnosis not present

## 2024-01-26 DIAGNOSIS — Z95828 Presence of other vascular implants and grafts: Secondary | ICD-10-CM

## 2024-01-26 DIAGNOSIS — C184 Malignant neoplasm of transverse colon: Secondary | ICD-10-CM

## 2024-01-26 DIAGNOSIS — Z86711 Personal history of pulmonary embolism: Secondary | ICD-10-CM

## 2024-01-26 DIAGNOSIS — E279 Disorder of adrenal gland, unspecified: Secondary | ICD-10-CM

## 2024-01-26 DIAGNOSIS — Z800 Family history of malignant neoplasm of digestive organs: Secondary | ICD-10-CM

## 2024-01-26 DIAGNOSIS — Z860100 Personal history of colon polyps, unspecified: Secondary | ICD-10-CM

## 2024-01-26 DIAGNOSIS — Z8619 Personal history of other infectious and parasitic diseases: Secondary | ICD-10-CM

## 2024-01-26 DIAGNOSIS — Z7901 Long term (current) use of anticoagulants: Secondary | ICD-10-CM

## 2024-01-26 DIAGNOSIS — Z86718 Personal history of other venous thrombosis and embolism: Secondary | ICD-10-CM

## 2024-01-26 LAB — CMP (CANCER CENTER ONLY)
ALT: 15 U/L (ref 0–44)
AST: 19 U/L (ref 15–41)
Albumin: 4.3 g/dL (ref 3.5–5.0)
Alkaline Phosphatase: 89 U/L (ref 38–126)
Anion gap: 13 (ref 5–15)
BUN: 27 mg/dL — ABNORMAL HIGH (ref 8–23)
CO2: 21 mmol/L — ABNORMAL LOW (ref 22–32)
Calcium: 9.6 mg/dL (ref 8.9–10.3)
Chloride: 107 mmol/L (ref 98–111)
Creatinine: 1.51 mg/dL — ABNORMAL HIGH (ref 0.44–1.00)
GFR, Estimated: 34 mL/min — ABNORMAL LOW (ref >60)
Glucose, Bld: 138 mg/dL — ABNORMAL HIGH (ref 70–99)
Potassium: 4.2 mmol/L (ref 3.5–5.1)
Sodium: 141 mmol/L (ref 135–145)
Total Bilirubin: 0.3 mg/dL (ref 0.0–1.2)
Total Protein: 7 g/dL (ref 6.5–8.1)

## 2024-01-26 LAB — CBC WITH DIFFERENTIAL (CANCER CENTER ONLY)
Abs Immature Granulocytes: 0.01 K/uL (ref 0.00–0.07)
Basophils Absolute: 0 K/uL (ref 0.0–0.1)
Basophils Relative: 2 %
Eosinophils Absolute: 0.3 K/uL (ref 0.0–0.5)
Eosinophils Relative: 12 %
HCT: 31 % — ABNORMAL LOW (ref 36.0–46.0)
Hemoglobin: 10.2 g/dL — ABNORMAL LOW (ref 12.0–15.0)
Immature Granulocytes: 0 %
Lymphocytes Relative: 42 %
Lymphs Abs: 1.1 K/uL (ref 0.7–4.0)
MCH: 31.8 pg (ref 26.0–34.0)
MCHC: 32.9 g/dL (ref 30.0–36.0)
MCV: 96.6 fL (ref 80.0–100.0)
Monocytes Absolute: 0.1 K/uL (ref 0.1–1.0)
Monocytes Relative: 5 %
Neutro Abs: 1.1 K/uL — ABNORMAL LOW (ref 1.7–7.7)
Neutrophils Relative %: 39 %
Platelet Count: 184 K/uL (ref 150–400)
RBC: 3.21 MIL/uL — ABNORMAL LOW (ref 3.87–5.11)
RDW: 15.2 % (ref 11.5–15.5)
WBC Count: 2.7 K/uL — ABNORMAL LOW (ref 4.0–10.5)
nRBC: 0 % (ref 0.0–0.2)

## 2024-01-26 MED ORDER — SODIUM CHLORIDE 0.9% FLUSH
10.0000 mL | INTRAVENOUS | Status: AC | PRN
  Administered 2024-01-26: 10 mL

## 2024-01-26 MED ORDER — SODIUM CHLORIDE 0.9 % IV SOLN
400.0000 mg/m2 | Freq: Once | INTRAVENOUS | Status: AC
  Administered 2024-01-26: 740 mg via INTRAVENOUS
  Filled 2024-01-26 (×2): qty 37

## 2024-01-26 MED ORDER — LIDOCAINE-PRILOCAINE 2.5-2.5 % EX CREA: 1.0000 | TOPICAL_CREAM | CUTANEOUS | 3 refills | Status: AC | PRN

## 2024-01-26 MED ORDER — SODIUM CHLORIDE 0.9 % IV SOLN: INTRAVENOUS | Status: DC

## 2024-01-26 MED ORDER — FLUOROURACIL CHEMO INJECTION 2.5 GM/50ML
350.0000 mg/m2 | Freq: Once | INTRAVENOUS | Status: AC
  Administered 2024-01-26: 650 mg via INTRAVENOUS
  Filled 2024-01-26: qty 13

## 2024-01-26 MED ORDER — PROCHLORPERAZINE MALEATE 10 MG PO TABS
5.0000 mg | ORAL_TABLET | Freq: Once | ORAL | Status: AC
  Administered 2024-01-26: 5 mg via ORAL
  Filled 2024-01-26: qty 1

## 2024-01-26 MED ORDER — HEPARIN SOD (PORK) LOCK FLUSH 100 UNIT/ML IV SOLN
500.0000 [IU] | Freq: Once | INTRAVENOUS | Status: AC | PRN
Start: 2024-01-26 — End: 2024-01-26
  Administered 2024-01-26: 500 [IU]

## 2024-01-26 MED ORDER — SODIUM CHLORIDE 0.9% FLUSH
10.0000 mL | INTRAVENOUS | Status: DC | PRN
Start: 2024-01-26 — End: 2024-01-26
  Administered 2024-01-26: 10 mL

## 2024-01-26 NOTE — Patient Instructions (Signed)
 [facility] - A DEPT OF [facility]  Discharge Instructions: Thank you for choosing [facility] to provide your oncology and hematology care.   If you have a lab appointment with the Cancer Center, please go directly to the Cancer Center and check in at the registration area.   Wear comfortable clothing and clothing appropriate for easy access to any Portacath or PICC line.   We strive to give you quality time with your provider. You may need to reschedule your appointment if you arrive late (15 or more minutes).  Arriving late affects you and other patients whose appointments are after yours.  Also, if you miss three or more appointments without notifying the office, you may be dismissed from the clinic at the provider's discretion.      For prescription refill requests, have your pharmacy contact our office and allow 72 hours for refills to be completed.    Today you received the following chemotherapy and/or immunotherapy agents: Leucovorin  and Fluorouracil  (5-FU)      To help prevent nausea and vomiting after your treatment, we encourage you to take your nausea medication as directed.  BELOW ARE SYMPTOMS THAT SHOULD BE REPORTED IMMEDIATELY: *FEVER GREATER THAN 100.4 F (38 C) OR HIGHER *CHILLS OR SWEATING *NAUSEA AND VOMITING THAT IS NOT CONTROLLED WITH YOUR NAUSEA MEDICATION *UNUSUAL SHORTNESS OF BREATH *UNUSUAL BRUISING OR BLEEDING *URINARY PROBLEMS (pain or burning when urinating, or frequent urination) *BOWEL PROBLEMS (unusual diarrhea, constipation, pain near the anus) TENDERNESS IN MOUTH AND THROAT WITH OR WITHOUT PRESENCE OF ULCERS (sore throat, sores in mouth, or a toothache) UNUSUAL RASH, SWELLING OR PAIN  UNUSUAL VAGINAL DISCHARGE OR ITCHING   Items with * indicate a potential emergency and should be followed up as soon as possible or go to the Emergency Department if any problems should occur.  Please show the CHEMOTHERAPY  ALERT CARD or IMMUNOTHERAPY ALERT CARD at check-in to the Emergency Department and triage nurse.  Should you have questions after your visit or need to cancel or reschedule your appointment, please contact [facility] - A DEPT OF [facility]  Dept: [phone]  and follow the prompts.  Office hours are 8:00 a.m. to 4:30 p.m. Monday - Friday. Please note that voicemails left after 4:00 p.m. may not be returned until the following business day.  We are closed weekends and major holidays. You have access to a nurse at all times for urgent questions. Please call the main number to the clinic Dept: [phone] and follow the prompts.   For any non-urgent questions, you may also contact your provider using MyChart. We now offer e-Visits for anyone 57 and older to request care online for non-urgent symptoms. For details visit [email or web address].   Also download the MyChart app! Go to the app store, search MyChart, open the app, select [place], and log in with your MyChart username and password.

## 2024-01-26 NOTE — Progress Notes (Signed)
 Per MD dose reduction to 350 mg/m^2 today and going forward due to low counts.   Alfonso MARLA Buys, PharmD Pharmacy Resident  01/26/2024 12:43 PM

## 2024-01-26 NOTE — Progress Notes (Signed)
 Patient seen by Lacie Burton, NP today  Vitals are within treatment parameters:Yes  Decreased 2 pounds Labs are within treatment parameters: ANC-1.1, Creat-1.51 Okay to treat   Treatment plan has been signed: Yes   Per physician team, Patient is ready for treatment. Please note the following modifications:  Dose reduce 5FU

## 2024-01-26 NOTE — Progress Notes (Signed)
 [facility]   Telephone:([phone] Fax:([phone]) [phone]    Patient Care Team: Beverley Louann DASEN, MD as PCP - General (Internal Medicine) Adrienne Lamprey, MD as Consulting Physician (Gastroenterology)   CHIEF COMPLAINT: Follow up colon cancer   CURRENT THERAPY:   INTERVAL HISTORY Adrienne Rogers returns for follow up and treatment. Last seen 01/18/24 with week 2 [place] 48fu/leuc. Tolerating treatment well, much better than Xeloda . Denies hand foot syndrome or mucositis. Managing constipation. She vomited once after eating breakfast then immediately taking am meds. Has not required anti-emetics. Eating/drinking well. Energy has improved. Denies pain. She took both BP meds prior to arriving today, which is a little earlier than usual, and is slightly headed. This happens occasionally.    ROS  All other systems reviewed and negative  Past Medical History:  Diagnosis Date   Asthma    COPD (chronic obstructive pulmonary disease) (HCC)    Depression    Hyperlipidemia    Hypertension    PTSD (post-traumatic stress disorder)      Past Surgical History:  Procedure Laterality Date   ABDOMINAL HYSTERECTOMY     1981   BACK SURGERY     ACDF 2005   BIOPSY  11/10/2021   Procedure: BIOPSY;  Surgeon: Rollin Dover, MD;  Location: [facility];  Service: Gastroenterology;;   CHOLECYSTECTOMY     ESOPHAGOGASTRODUODENOSCOPY N/A 11/10/2021   Procedure: ESOPHAGOGASTRODUODENOSCOPY (EGD);  Surgeon: Rollin Dover, MD;  Location: [facility];  Service: Gastroenterology;  Laterality: N/A;  IDA, Melena   IR IMAGING GUIDED PORT INSERTION  01/11/2024   PARTIAL COLECTOMY N/A 08/03/2023   Procedure: OPEN PARTIAL COLECTOMY;  Surgeon: Signe Mitzie LABOR, MD;  Location: [facility];  Service: General;  Laterality: N/A;   right knee arthroscopy     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   albuterol  (VENTOLIN  HFA) 108 (90 Base) MCG/ACT inhaler Inhale 2 puffs into the lungs every 6 (six) hours as needed for wheezing or shortness of breath.   APIXABAN  (ELIQUIS ) VTE STARTER PACK (10MG  AND 5MG ) Take as directed on package: start with two-5mg  tablets twice daily for 7 days. On day 8, switch to one-5mg  tablet twice daily.   carvedilol  (COREG ) 25 MG tablet Take 0.5 tablets (12.5 mg total) by mouth 2 (two) times daily.   Cholecalciferol  25 MCG (1000 UT) tablet Take 1,000 Units by mouth daily.   docusate sodium  (COLACE) 100 MG capsule Take 1 capsule (100 mg total) by mouth 2 (two) times daily as needed for mild constipation.   ferrous sulfate  325 (65 FE) MG tablet Take 325 mg by mouth daily with breakfast.   fluticasone  (FLONASE) 50 MCG/ACT nasal spray Place 2 sprays into the nose daily.   ketotifen  (ZADITOR ) 0.035 % ophthalmic solution Place 1 drop into both eyes in the morning and at bedtime.   lidocaine -prilocaine  (EMLA ) cream Apply 1 Application topically as needed.   pantoprazole  (PROTONIX ) 40 MG tablet Take 40 mg by mouth 2 (two) times daily before a meal.   polyethylene glycol (MIRALAX  / GLYCOLAX ) 17 g packet Take 17 g by mouth 2 (  two) times daily as needed.   sertraline  (ZOLOFT ) 100 MG tablet Take 200 mg by mouth daily.   simvastatin  (ZOCOR ) 40 MG tablet Take 40 mg by mouth at bedtime.    spironolactone  (ALDACTONE ) 25 MG tablet Take 25 mg by mouth daily.   Tiotropium Bromide  Monohydrate 2.5 MCG/ACT AERS Take 2 puffs by mouth daily.   traZODone  (DESYREL ) 100 MG tablet Take 100 mg by mouth at bedtime.   WIXELA INHUB 250-50 MCG/ACT AEPB Inhale 1 puff into the lungs 2 (two) times daily.   Meclizine HCl 25 MG CHEW Chew 25  mg by mouth as needed. (Patient not taking: Reported on 01/26/2024)   methocarbamol  (ROBAXIN ) 500 MG tablet Take 1 tablet (500 mg total) by mouth every 6 (six) hours as needed for muscle spasms. (Patient not taking: Reported on 01/26/2024)   Facility-Administered Encounter Medications as of 01/26/2024  Medication   [COMPLETED] sodium chloride  flush (NS) 0.9 % injection 10 mL     Today's Vitals   01/26/24 1136 01/26/24 1142 01/26/24 1158  BP: 96/64  112/86  Pulse: 71    Resp: 15    Temp: 97.9 F (36.6 C)    TempSrc: Oral    SpO2: 100%    Weight: 177 lb (80.3 kg)    Height: 5' 5 (1.651 m)    PainSc:  0-No pain    Body mass index is 29.45 kg/m.   ECOG PERFORMANCE STATUS: 1 - Symptomatic but completely ambulatory  PHYSICAL EXAM GENERAL:alert, no distress and comfortable SKIN: no rash  EYES: sclera clear NECK: without mass LYMPH:  no palpable cervical or supraclavicular lymphadenopathy  LUNGS: clear with normal breathing effort HEART: regular rate & rhythm, no lower extremity edema ABDOMEN: abdomen soft, non-tender and normal bowel sounds NEURO: alert & oriented x 3 with fluent speech, no focal motor deficits PAC without erythema    CBC    Latest Ref Rng & Units 01/26/2024   11:28 AM 01/18/2024    2:50 PM 01/12/2024   11:20 AM  CBC  WBC 4.0 - 10.5 K/uL 2.7  4.2  5.2   Hemoglobin 12.0 - 15.0 g/dL 89.7  89.7  88.8   Hematocrit 36.0 - 46.0 % 31.0  31.1  33.9   Platelets 150 - 400 K/uL 184  189  210       CMP     Latest Ref Rng & Units 01/26/2024   11:28 AM 01/18/2024    2:50 PM 01/12/2024   11:20 AM  CMP  Glucose 70 - 99 mg/dL 861  868  885   BUN 8 - 23 mg/dL 27  27  26    Creatinine 0.44 - 1.00 mg/dL 8.48  8.59  8.71   Sodium 135 - 145 mmol/L 141  140  140   Potassium 3.5 - 5.1 mmol/L 4.2  4.3  4.1   Chloride 98 - 111 mmol/L 107  108  106   CO2 22 - 32 mmol/L 21  22  22    Calcium  8.9 - 10.3 mg/dL 9.6  9.5  9.8   Total Protein 6.5 - 8.1 g/dL 7.0  6.8  7.1   Total  Bilirubin 0.0 - 1.2 mg/dL 0.3  0.3  0.4   Alkaline Phos 38 - 126 U/L 89  93  113   AST 15 - 41 U/L 19  19  21    ALT 0 - 44 U/L 15  19  19        ASSESSMENT & PLAN:  Transverse colon  cancer, stage IIIb (pT3,pN1b,cM0), status post a segmental transverse colectomy 08/03/2023 CT abdomen/pelvis 07/22/2023: Proximal colonic distention with transition point in the transverse colon, 4 cm lesion of the distal transverse colon with evidence of transmural extension/serosal component, small lymph nodes in the transverse mesocolon, indeterminate 1.9 cm left adrenal nodule Colonoscopy 07/22/2023: Mass in the ascending colon biopsy-invasive moderately differentiated adenocarcinoma CT chest 08/02/2023: No evidence of metastatic disease, indeterminate left adrenal nodule Segmental transverse colectomy and colocolonic anastomosis 08/03/2023-moderately differentiated adenocarcinoma of the transverse colon, tumor extends into pericolonic adipose tissue, lymphovascular invasion present, 2/13 nodes Mismatch repair protein expression intact, MSI stable Elevated preoperative CEA 08/02/2023 (42.7) 08/31/2023 CEA normal (4.56) Cycle 1 capecitabine  09/08/2023 Cycle 2 capecitabine  09/29/2023 Cycle 3 capecitabine  10/20/2023, capecitabine  dose reduced to 1000 mg twice daily Cycle 4 capecitabine  11/18/2023, capecitabine  dose reduced to 1000 mg twice daily Cycle 5 capecitabine  12/16/2023, capecitabine  dose reduced to 1000 mg twice daily, discontinued day 8 due to recurrent dizziness Week one 5-FU/leucovorin  per the [place] regimen 01/12/2024 Week two 5-FU/leucovorin  per the [place] regimen 01/19/2024 Week three 5-FU/leucovorin  per [place] regimen, 5-FU dose reduced for neutropenia 01/26/2024  Indeterminate left adrenal nodule on CT abdomen/pelvis 07/22/2023 History of colonic polyps Hypertension For lipidemia Asthma History of H. 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 Abadi appears stable, s/p 5 cycles of capecitabine  and 2 cycles of weekly 5-FU/leucovorin  which she tolerates much better.  Able to recover and function and maintain adequate performance status.  Labs reviewed, adequate for treatment; ANC 1.1.  We reviewed neutropenic precautions.  Plan to proceed with week three 5-FU/leuc with a mild dose reduction.   She will return for lab and week four 5-FU/leuc next week as scheduled, or sooner if needed.   Plan reviewed with Dr. Cloretta.   I will follow up with Dr. Kristie on potential ?colonoscopy 7/28, will recommend to postpone until after she completes chemotherapy.    All questions were answered. The patient knows to call the clinic with any problems, questions or concerns. No barriers to learning were detected.  Gustave Lindeman K Cielo Arias, NP 01/26/2024

## 2024-01-26 NOTE — Patient Instructions (Signed)

## 2024-01-28 ENCOUNTER — Encounter

## 2024-02-01 ENCOUNTER — Ambulatory Visit: Admitting: Nurse Practitioner

## 2024-02-02 ENCOUNTER — Inpatient Hospital Stay

## 2024-02-02 ENCOUNTER — Inpatient Hospital Stay: Admitting: Nurse Practitioner

## 2024-02-02 ENCOUNTER — Encounter: Payer: Self-pay | Admitting: Nurse Practitioner

## 2024-02-02 VITALS — BP 104/62 | HR 78 | Temp 97.8°F | Resp 18 | Ht 65.0 in | Wt 176.5 lb

## 2024-02-02 DIAGNOSIS — Z8719 Personal history of other diseases of the digestive system: Secondary | ICD-10-CM

## 2024-02-02 DIAGNOSIS — C184 Malignant neoplasm of transverse colon: Secondary | ICD-10-CM

## 2024-02-02 DIAGNOSIS — Z800 Family history of malignant neoplasm of digestive organs: Secondary | ICD-10-CM

## 2024-02-02 DIAGNOSIS — J45909 Unspecified asthma, uncomplicated: Secondary | ICD-10-CM

## 2024-02-02 DIAGNOSIS — E279 Disorder of adrenal gland, unspecified: Secondary | ICD-10-CM | POA: Diagnosis not present

## 2024-02-02 DIAGNOSIS — Z860109 Personal history of other colon polyps: Secondary | ICD-10-CM | POA: Diagnosis not present

## 2024-02-02 DIAGNOSIS — Z86718 Personal history of other venous thrombosis and embolism: Secondary | ICD-10-CM

## 2024-02-02 DIAGNOSIS — E785 Hyperlipidemia, unspecified: Secondary | ICD-10-CM

## 2024-02-02 DIAGNOSIS — D709 Neutropenia, unspecified: Secondary | ICD-10-CM

## 2024-02-02 DIAGNOSIS — I10 Essential (primary) hypertension: Secondary | ICD-10-CM

## 2024-02-02 DIAGNOSIS — Z5111 Encounter for antineoplastic chemotherapy: Secondary | ICD-10-CM | POA: Diagnosis not present

## 2024-02-02 DIAGNOSIS — Z86711 Personal history of pulmonary embolism: Secondary | ICD-10-CM

## 2024-02-02 LAB — CBC WITH DIFFERENTIAL (CANCER CENTER ONLY)
Abs Immature Granulocytes: 0 K/uL (ref 0.00–0.07)
Basophils Absolute: 0 K/uL (ref 0.0–0.1)
Basophils Relative: 1 %
Eosinophils Absolute: 0.3 K/uL (ref 0.0–0.5)
Eosinophils Relative: 10 %
HCT: 30.5 % — ABNORMAL LOW (ref 36.0–46.0)
Hemoglobin: 10.1 g/dL — ABNORMAL LOW (ref 12.0–15.0)
Immature Granulocytes: 0 %
Lymphocytes Relative: 51 %
Lymphs Abs: 1.3 K/uL (ref 0.7–4.0)
MCH: 31.4 pg (ref 26.0–34.0)
MCHC: 33.1 g/dL (ref 30.0–36.0)
MCV: 94.7 fL (ref 80.0–100.0)
Monocytes Absolute: 0.2 K/uL (ref 0.1–1.0)
Monocytes Relative: 6 %
Neutro Abs: 0.9 K/uL — ABNORMAL LOW (ref 1.7–7.7)
Neutrophils Relative %: 32 %
Platelet Count: 180 K/uL (ref 150–400)
RBC: 3.22 MIL/uL — ABNORMAL LOW (ref 3.87–5.11)
RDW: 15.1 % (ref 11.5–15.5)
WBC Count: 2.6 K/uL — ABNORMAL LOW (ref 4.0–10.5)
nRBC: 0 % (ref 0.0–0.2)

## 2024-02-02 LAB — CMP (CANCER CENTER ONLY)
ALT: 16 U/L (ref 0–44)
AST: 21 U/L (ref 15–41)
Albumin: 4.4 g/dL (ref 3.5–5.0)
Alkaline Phosphatase: 87 U/L (ref 38–126)
Anion gap: 12 (ref 5–15)
BUN: 22 mg/dL (ref 8–23)
CO2: 23 mmol/L (ref 22–32)
Calcium: 10.2 mg/dL (ref 8.9–10.3)
Chloride: 105 mmol/L (ref 98–111)
Creatinine: 1.53 mg/dL — ABNORMAL HIGH (ref 0.44–1.00)
GFR, Estimated: 34 mL/min — ABNORMAL LOW (ref >60)
Glucose, Bld: 152 mg/dL — ABNORMAL HIGH (ref 70–99)
Potassium: 4.2 mmol/L (ref 3.5–5.1)
Sodium: 141 mmol/L (ref 135–145)
Total Bilirubin: 0.3 mg/dL (ref 0.0–1.2)
Total Protein: 7 g/dL (ref 6.5–8.1)

## 2024-02-02 MED ORDER — SODIUM CHLORIDE 0.9% FLUSH
10.0000 mL | INTRAVENOUS | Status: DC | PRN
  Administered 2024-02-02: 10 mL via INTRAVENOUS

## 2024-02-02 MED ORDER — SODIUM CHLORIDE 0.9 % IV SOLN: INTRAVENOUS | Status: AC

## 2024-02-02 MED ORDER — HEPARIN SOD (PORK) LOCK FLUSH 100 UNIT/ML IV SOLN
500.0000 [IU] | Freq: Once | INTRAVENOUS | Status: AC
  Administered 2024-02-02: 500 [IU] via INTRAVENOUS

## 2024-02-02 NOTE — Progress Notes (Signed)
 [facility] OFFICE PROGRESS NOTE   Diagnosis: Colon cancer  INTERVAL HISTORY:   Adrienne Rogers returns as scheduled.  She completed week three 5-FU/leucovorin  01/26/2024.  5-FU was dose reduced due to neutropenia.  She tends to feel weak in the morning hours, better in the afternoon.  She denies nausea/vomiting.  No mouth sores.  No diarrhea.  No hand or foot pain or redness.  She feels fluid intake is likely less than optimal.  She notes her mouth felt dry yesterday.  She denies dizziness.  States periodically lightheaded.  No fever, chills.  Objective:  Vital signs in last 24 hours:  Blood pressure 104/62, pulse 78, temperature 97.8 F (36.6 C), temperature source Temporal, resp. rate 18, height 5' 5 (1.651 m), weight 176 lb 8 oz (80.1 kg), SpO2 100%.    HEENT: No thrush or ulcers.  Mucous membranes appear moist. Resp: Lungs clear bilaterally. Cardio: Regular rate and rhythm. GI: Abdomen soft and nontender.  No hepatosplenomegaly. Vascular: No leg edema. Skin: Palms without erythema.  Mild decrease in skin turgor. Port-A-Cath without erythema.  Lab Results:  Lab Results  Component Value Date   WBC 2.6 (L) 02/02/2024   HGB 10.1 (L) 02/02/2024   HCT 30.5 (L) 02/02/2024   MCV 94.7 02/02/2024   PLT 180 02/02/2024   NEUTROABS 0.9 (L) 02/02/2024    Imaging:  No results found.  Medications: I have reviewed the patient's current medications.  Assessment/Plan: Transverse colon cancer, stage IIIb (pT3,pN1b,cM0), status post a segmental transverse colectomy 08/03/2023 CT abdomen/pelvis 07/22/2023: Proximal colonic distention with transition point in the transverse colon, 4 cm lesion of the distal transverse colon with evidence of transmural extension/serosal component, small lymph nodes in the transverse mesocolon, indeterminate 1.9 cm left adrenal nodule Colonoscopy 07/22/2023: Mass in the ascending colon biopsy-invasive moderately differentiated adenocarcinoma CT  chest 08/02/2023: No evidence of metastatic disease, indeterminate left adrenal nodule Segmental transverse colectomy and colocolonic anastomosis 08/03/2023-moderately differentiated adenocarcinoma of the transverse colon, tumor extends into pericolonic adipose tissue, lymphovascular invasion present, 2/13 nodes Mismatch repair protein expression intact, MSI stable Elevated preoperative CEA 08/02/2023 (42.7) 08/31/2023 CEA normal (4.56) Cycle 1 capecitabine  09/08/2023 Cycle 2 capecitabine  09/29/2023 Cycle 3 capecitabine  10/20/2023, capecitabine  dose reduced to 1000 mg twice daily Cycle 4 capecitabine  11/18/2023, capecitabine  dose reduced to 1000 mg twice daily Cycle 5 capecitabine  12/16/2023, capecitabine  dose reduced to 1000 mg twice daily, discontinued day 8 due to recurrent dizziness Week one 5-FU/leucovorin  per the [place] regimen 01/12/2024 Week two 5-FU/leucovorin  per the [place] regimen 01/19/2024 Week three 5-FU/leucovorin  per [place] regimen, 5-FU dose reduced for neutropenia 01/26/2024 Treatment held 02/02/2024 due to neutropenia   Indeterminate left adrenal nodule on CT abdomen/pelvis 07/22/2023 History of colonic polyps Hypertension For lipidemia Asthma History of H. 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  She has completed 3 weekly treatments with 5-FU/leucovorin .  5-FU was dose reduced with the week 3 treatment due to neutropenia.  She has progressive neutropenia on labs today.  We are holding today's treatment and rescheduling for 1 week.  We discussed neutropenic precautions.  She understands to contact the office with fever, chills, other  signs of infection.  She may be  mildly dehydrated.  She will receive IV fluids today.  She will return for follow-up and treatment as scheduled in 1 week.  She will contact the office in the interim with any problems.    Olam Ned ANP/GNP-BC   02/02/2024  10:36 AM

## 2024-02-02 NOTE — Patient Instructions (Signed)

## 2024-02-03 ENCOUNTER — Other Ambulatory Visit

## 2024-02-03 ENCOUNTER — Ambulatory Visit

## 2024-02-03 ENCOUNTER — Ambulatory Visit: Admitting: Oncology

## 2024-02-03 ENCOUNTER — Other Ambulatory Visit: Payer: Self-pay

## 2024-02-05 ENCOUNTER — Other Ambulatory Visit: Payer: Self-pay

## 2024-02-09 ENCOUNTER — Inpatient Hospital Stay

## 2024-02-09 ENCOUNTER — Inpatient Hospital Stay: Attending: Oncology | Admitting: Oncology

## 2024-02-09 ENCOUNTER — Ambulatory Visit

## 2024-02-09 VITALS — BP 102/68 | HR 60 | Temp 97.9°F | Resp 18 | Ht 65.0 in | Wt 175.8 lb

## 2024-02-09 DIAGNOSIS — I10 Essential (primary) hypertension: Secondary | ICD-10-CM | POA: Insufficient documentation

## 2024-02-09 DIAGNOSIS — Z800 Family history of malignant neoplasm of digestive organs: Secondary | ICD-10-CM | POA: Insufficient documentation

## 2024-02-09 DIAGNOSIS — C184 Malignant neoplasm of transverse colon: Secondary | ICD-10-CM

## 2024-02-09 DIAGNOSIS — Z860100 Personal history of colon polyps, unspecified: Secondary | ICD-10-CM | POA: Diagnosis not present

## 2024-02-09 DIAGNOSIS — N289 Disorder of kidney and ureter, unspecified: Secondary | ICD-10-CM | POA: Diagnosis not present

## 2024-02-09 DIAGNOSIS — Z86711 Personal history of pulmonary embolism: Secondary | ICD-10-CM | POA: Diagnosis not present

## 2024-02-09 DIAGNOSIS — Z8619 Personal history of other infectious and parasitic diseases: Secondary | ICD-10-CM | POA: Diagnosis not present

## 2024-02-09 DIAGNOSIS — J45909 Unspecified asthma, uncomplicated: Secondary | ICD-10-CM | POA: Insufficient documentation

## 2024-02-09 DIAGNOSIS — E785 Hyperlipidemia, unspecified: Secondary | ICD-10-CM | POA: Insufficient documentation

## 2024-02-09 DIAGNOSIS — Z7901 Long term (current) use of anticoagulants: Secondary | ICD-10-CM | POA: Diagnosis not present

## 2024-02-09 DIAGNOSIS — Z5111 Encounter for antineoplastic chemotherapy: Secondary | ICD-10-CM | POA: Insufficient documentation

## 2024-02-09 DIAGNOSIS — E279 Disorder of adrenal gland, unspecified: Secondary | ICD-10-CM | POA: Diagnosis not present

## 2024-02-09 DIAGNOSIS — D701 Agranulocytosis secondary to cancer chemotherapy: Secondary | ICD-10-CM | POA: Insufficient documentation

## 2024-02-09 DIAGNOSIS — T451X5A Adverse effect of antineoplastic and immunosuppressive drugs, initial encounter: Secondary | ICD-10-CM | POA: Insufficient documentation

## 2024-02-09 DIAGNOSIS — Z86718 Personal history of other venous thrombosis and embolism: Secondary | ICD-10-CM | POA: Insufficient documentation

## 2024-02-09 DIAGNOSIS — Z9049 Acquired absence of other specified parts of digestive tract: Secondary | ICD-10-CM | POA: Insufficient documentation

## 2024-02-09 LAB — CBC WITH DIFFERENTIAL (CANCER CENTER ONLY)
Abs Immature Granulocytes: 0 K/uL (ref 0.00–0.07)
Basophils Absolute: 0 K/uL (ref 0.0–0.1)
Basophils Relative: 1 %
Eosinophils Absolute: 0.1 K/uL (ref 0.0–0.5)
Eosinophils Relative: 5 %
HCT: 31.7 % — ABNORMAL LOW (ref 36.0–46.0)
Hemoglobin: 10.4 g/dL — ABNORMAL LOW (ref 12.0–15.0)
Immature Granulocytes: 0 %
Lymphocytes Relative: 51 %
Lymphs Abs: 1 K/uL (ref 0.7–4.0)
MCH: 31.6 pg (ref 26.0–34.0)
MCHC: 32.8 g/dL (ref 30.0–36.0)
MCV: 96.4 fL (ref 80.0–100.0)
Monocytes Absolute: 0.4 K/uL (ref 0.1–1.0)
Monocytes Relative: 18 %
Neutro Abs: 0.5 K/uL — ABNORMAL LOW (ref 1.7–7.7)
Neutrophils Relative %: 25 %
Platelet Count: 171 K/uL (ref 150–400)
RBC: 3.29 MIL/uL — ABNORMAL LOW (ref 3.87–5.11)
RDW: 16.3 % — ABNORMAL HIGH (ref 11.5–15.5)
WBC Count: 2.1 K/uL — ABNORMAL LOW (ref 4.0–10.5)
nRBC: 0 % (ref 0.0–0.2)

## 2024-02-09 LAB — CMP (CANCER CENTER ONLY)
ALT: 17 U/L (ref 0–44)
AST: 22 U/L (ref 15–41)
Albumin: 4.2 g/dL (ref 3.5–5.0)
Alkaline Phosphatase: 92 U/L (ref 38–126)
Anion gap: 14 (ref 5–15)
BUN: 21 mg/dL (ref 8–23)
CO2: 22 mmol/L (ref 22–32)
Calcium: 9.6 mg/dL (ref 8.9–10.3)
Chloride: 104 mmol/L (ref 98–111)
Creatinine: 1.72 mg/dL — ABNORMAL HIGH (ref 0.44–1.00)
GFR, Estimated: 29 mL/min — ABNORMAL LOW (ref >60)
Glucose, Bld: 153 mg/dL — ABNORMAL HIGH (ref 70–99)
Potassium: 3.9 mmol/L (ref 3.5–5.1)
Sodium: 140 mmol/L (ref 135–145)
Total Bilirubin: 0.2 mg/dL (ref 0.0–1.2)
Total Protein: 7 g/dL (ref 6.5–8.1)

## 2024-02-09 NOTE — Progress Notes (Signed)
 [facility] OFFICE PROGRESS NOTE   Diagnosis: Colon cancer  INTERVAL HISTORY:   Adrienne Rogers returns as scheduled.  She was last treated with 5-FU/leucovorin  01/26/2024.  No mouth sores, nausea, or diarrhea.  Chemotherapy was held last week due to neutropenia.  No new complaint.  Objective:  Vital signs in last 24 hours:  Blood pressure 102/68, pulse 60, temperature 97.9 F (36.6 C), temperature source Temporal, resp. rate 18, height 5' 5 (1.651 m), weight 175 lb 12.8 oz (79.7 kg), SpO2 98%.    HEENT: No thrush or ulcers Resp: Lungs clear bilaterally Cardio: Regular rate and rhythm GI: Nontender, no hepatosplenomegaly Vascular: No leg edema     Portacath/PICC-without erythema  Lab Results:  Lab Results  Component Value Date   WBC 2.1 (L) 02/09/2024   HGB 10.4 (L) 02/09/2024   HCT 31.7 (L) 02/09/2024   MCV 96.4 02/09/2024   PLT 171 02/09/2024   NEUTROABS 0.5 (L) 02/09/2024    CMP  Lab Results  Component Value Date   NA 140 02/09/2024   K 3.9 02/09/2024   CL 104 02/09/2024   CO2 22 02/09/2024   GLUCOSE 153 (H) 02/09/2024   BUN 21 02/09/2024   CREATININE 1.72 (H) 02/09/2024   CALCIUM  9.6 02/09/2024   PROT 7.0 02/09/2024   ALBUMIN 4.2 02/09/2024   AST 22 02/09/2024   ALT 17 02/09/2024   ALKPHOS 92 02/09/2024   BILITOT 0.2 02/09/2024   GFRNONAA 29 (L) 02/09/2024   GFRAA 45 (L) 03/09/2018    Lab Results  Component Value Date   CEA1 42.7 (H) 08/02/2023   CEA 3.78 12/07/2023    Lab Results  Component Value Date   INR 1.03 03/09/2018   LABPROT 13.4 03/09/2018    Imaging:  No results found.  Medications: I have reviewed the patient's current medications.   Assessment/Plan: Transverse colon cancer, stage IIIb (pT3,pN1b,cM0), status post a segmental transverse colectomy 08/03/2023 CT abdomen/pelvis 07/22/2023: Proximal colonic distention with transition point in the transverse colon, 4 cm lesion of the distal transverse colon with  evidence of transmural extension/serosal component, small lymph nodes in the transverse mesocolon, indeterminate 1.9 cm left adrenal nodule Colonoscopy 07/22/2023: Mass in the ascending colon biopsy-invasive moderately differentiated adenocarcinoma CT chest 08/02/2023: No evidence of metastatic disease, indeterminate left adrenal nodule Segmental transverse colectomy and colocolonic anastomosis 08/03/2023-moderately differentiated adenocarcinoma of the transverse colon, tumor extends into pericolonic adipose tissue, lymphovascular invasion present, 2/13 nodes Mismatch repair protein expression intact, MSI stable Elevated preoperative CEA 08/02/2023 (42.7) 08/31/2023 CEA normal (4.56) Cycle 1 capecitabine  09/08/2023 Cycle 2 capecitabine  09/29/2023 Cycle 3 capecitabine  10/20/2023, capecitabine  dose reduced to 1000 mg twice daily Cycle 4 capecitabine  11/18/2023, capecitabine  dose reduced to 1000 mg twice daily Cycle 5 capecitabine  12/16/2023, capecitabine  dose reduced to 1000 mg twice daily, discontinued day 8 due to recurrent dizziness Week one 5-FU/leucovorin  per the [place] regimen 01/12/2024 Week two 5-FU/leucovorin  per the [place] regimen 01/19/2024 Week three 5-FU/leucovorin  per [place] regimen, 5-FU dose reduced for neutropenia 01/26/2024 Treatment held 02/02/2024 and 02/09/2024 due to neutropenia   Indeterminate left adrenal nodule on CT abdomen/pelvis 07/22/2023 History of colonic polyps Hypertension For lipidemia Asthma History of H. 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  She has persistent neutropenia secondary to chemotherapy.  Chemotherapy  will be held again today.  She will call for a fever.  She will return for a week for 5-FU/leucovorin  on 02/16/2024.  The chemotherapy will be further dose reduced.  She is scheduled for an office visit and chemotherapy on 02/23/2024.  Arley Hof, MD  02/09/2024  12:08 PM

## 2024-02-11 ENCOUNTER — Encounter

## 2024-02-11 ENCOUNTER — Other Ambulatory Visit: Payer: Self-pay

## 2024-02-16 ENCOUNTER — Other Ambulatory Visit: Payer: Self-pay | Admitting: Nurse Practitioner

## 2024-02-16 ENCOUNTER — Inpatient Hospital Stay

## 2024-02-16 ENCOUNTER — Ambulatory Visit

## 2024-02-16 ENCOUNTER — Other Ambulatory Visit

## 2024-02-16 ENCOUNTER — Ambulatory Visit: Admitting: Oncology

## 2024-02-16 ENCOUNTER — Ambulatory Visit: Admitting: Nurse Practitioner

## 2024-02-16 VITALS — BP 161/79 | HR 64 | Temp 97.4°F | Resp 16

## 2024-02-16 DIAGNOSIS — Z5111 Encounter for antineoplastic chemotherapy: Secondary | ICD-10-CM | POA: Diagnosis not present

## 2024-02-16 DIAGNOSIS — C184 Malignant neoplasm of transverse colon: Secondary | ICD-10-CM

## 2024-02-16 LAB — CBC WITH DIFFERENTIAL (CANCER CENTER ONLY)
Abs Immature Granulocytes: 0.02 K/uL (ref 0.00–0.07)
Basophils Absolute: 0 K/uL (ref 0.0–0.1)
Basophils Relative: 1 %
Eosinophils Absolute: 0.1 K/uL (ref 0.0–0.5)
Eosinophils Relative: 2 %
HCT: 33 % — ABNORMAL LOW (ref 36.0–46.0)
Hemoglobin: 10.8 g/dL — ABNORMAL LOW (ref 12.0–15.0)
Immature Granulocytes: 1 %
Lymphocytes Relative: 39 %
Lymphs Abs: 1.3 K/uL (ref 0.7–4.0)
MCH: 31.6 pg (ref 26.0–34.0)
MCHC: 32.7 g/dL (ref 30.0–36.0)
MCV: 96.5 fL (ref 80.0–100.0)
Monocytes Absolute: 0.5 K/uL (ref 0.1–1.0)
Monocytes Relative: 16 %
Neutro Abs: 1.4 K/uL — ABNORMAL LOW (ref 1.7–7.7)
Neutrophils Relative %: 41 %
Platelet Count: 185 K/uL (ref 150–400)
RBC: 3.42 MIL/uL — ABNORMAL LOW (ref 3.87–5.11)
RDW: 16.8 % — ABNORMAL HIGH (ref 11.5–15.5)
WBC Count: 3.3 K/uL — ABNORMAL LOW (ref 4.0–10.5)
nRBC: 0 % (ref 0.0–0.2)

## 2024-02-16 LAB — CMP (CANCER CENTER ONLY)
ALT: 20 U/L (ref 0–44)
AST: 20 U/L (ref 15–41)
Albumin: 4.3 g/dL (ref 3.5–5.0)
Alkaline Phosphatase: 90 U/L (ref 38–126)
Anion gap: 12 (ref 5–15)
BUN: 27 mg/dL — ABNORMAL HIGH (ref 8–23)
CO2: 23 mmol/L (ref 22–32)
Calcium: 10.2 mg/dL (ref 8.9–10.3)
Chloride: 106 mmol/L (ref 98–111)
Creatinine: 1.59 mg/dL — ABNORMAL HIGH (ref 0.44–1.00)
GFR, Estimated: 32 mL/min — ABNORMAL LOW (ref >60)
Glucose, Bld: 110 mg/dL — ABNORMAL HIGH (ref 70–99)
Potassium: 4.4 mmol/L (ref 3.5–5.1)
Sodium: 141 mmol/L (ref 135–145)
Total Bilirubin: 0.3 mg/dL (ref 0.0–1.2)
Total Protein: 7.1 g/dL (ref 6.5–8.1)

## 2024-02-16 MED ORDER — FLUOROURACIL CHEMO INJECTION 500 MG/10ML
200.0000 mg/m2 | Freq: Once | INTRAVENOUS | Status: AC
  Administered 2024-02-16: 350 mg via INTRAVENOUS
  Filled 2024-02-16: qty 7

## 2024-02-16 MED ORDER — SODIUM CHLORIDE 0.9 % IV SOLN
INTRAVENOUS | Status: DC
Start: 2024-02-16 — End: 2024-02-16

## 2024-02-16 MED ORDER — SODIUM CHLORIDE 0.9 % IV SOLN
200.0000 mg/m2 | Freq: Once | INTRAVENOUS | Status: AC
  Administered 2024-02-16: 370 mg via INTRAVENOUS
  Filled 2024-02-16: qty 18.5

## 2024-02-16 MED ORDER — PROCHLORPERAZINE MALEATE 10 MG PO TABS
5.0000 mg | ORAL_TABLET | Freq: Once | ORAL | Status: AC
  Administered 2024-02-16: 5 mg via ORAL
  Filled 2024-02-16: qty 1

## 2024-02-16 NOTE — Progress Notes (Signed)
 Per Dr. Cloretta: OK to treat today w/ANC 1.4 and creatinine 1.59.

## 2024-02-16 NOTE — Patient Instructions (Signed)
 [facility] - A DEPT OF [facility]  Discharge Instructions: Thank you for choosing [facility] to provide your oncology and hematology care.   If you have a lab appointment with the Cancer Center, please go directly to the Cancer Center and check in at the registration area.   Wear comfortable clothing and clothing appropriate for easy access to any Portacath or PICC line.   We strive to give you quality time with your provider. You may need to reschedule your appointment if you arrive late (15 or more minutes).  Arriving late affects you and other patients whose appointments are after yours.  Also, if you miss three or more appointments without notifying the office, you may be dismissed from the clinic at the provider's discretion.      For prescription refill requests, have your pharmacy contact our office and allow 72 hours for refills to be completed.    Today you received the following chemotherapy and/or immunotherapy agents: Leucovorin  and Fluorouracil  (5-FU)      To help prevent nausea and vomiting after your treatment, we encourage you to take your nausea medication as directed.  BELOW ARE SYMPTOMS THAT SHOULD BE REPORTED IMMEDIATELY: *FEVER GREATER THAN 100.4 F (38 C) OR HIGHER *CHILLS OR SWEATING *NAUSEA AND VOMITING THAT IS NOT CONTROLLED WITH YOUR NAUSEA MEDICATION *UNUSUAL SHORTNESS OF BREATH *UNUSUAL BRUISING OR BLEEDING *URINARY PROBLEMS (pain or burning when urinating, or frequent urination) *BOWEL PROBLEMS (unusual diarrhea, constipation, pain near the anus) TENDERNESS IN MOUTH AND THROAT WITH OR WITHOUT PRESENCE OF ULCERS (sore throat, sores in mouth, or a toothache) UNUSUAL RASH, SWELLING OR PAIN  UNUSUAL VAGINAL DISCHARGE OR ITCHING   Items with * indicate a potential emergency and should be followed up as soon as possible or go to the Emergency Department if any problems should occur.  Please show the CHEMOTHERAPY  ALERT CARD or IMMUNOTHERAPY ALERT CARD at check-in to the Emergency Department and triage nurse.  Should you have questions after your visit or need to cancel or reschedule your appointment, please contact [facility] - A DEPT OF [facility]  Dept: [phone]  and follow the prompts.  Office hours are 8:00 a.m. to 4:30 p.m. Monday - Friday. Please note that voicemails left after 4:00 p.m. may not be returned until the following business day.  We are closed weekends and major holidays. You have access to a nurse at all times for urgent questions. Please call the main number to the clinic Dept: [phone] and follow the prompts.   For any non-urgent questions, you may also contact your provider using MyChart. We now offer e-Visits for anyone 57 and older to request care online for non-urgent symptoms. For details visit [email or web address].   Also download the MyChart app! Go to the app store, search MyChart, open the app, select [place], and log in with your MyChart username and password.

## 2024-02-18 ENCOUNTER — Encounter

## 2024-02-19 ENCOUNTER — Other Ambulatory Visit: Payer: Self-pay | Admitting: Oncology

## 2024-02-20 NOTE — Addendum Note (Signed)
 Encounter addended by: Janice Lynwood BROCKS on: 02/20/2024 11:11 AM  Actions taken: Imaging Exam ended

## 2024-02-23 ENCOUNTER — Inpatient Hospital Stay

## 2024-02-23 ENCOUNTER — Inpatient Hospital Stay (HOSPITAL_BASED_OUTPATIENT_CLINIC_OR_DEPARTMENT_OTHER): Admitting: Oncology

## 2024-02-23 VITALS — BP 108/78 | HR 81 | Temp 97.8°F | Resp 18 | Ht 65.0 in | Wt 177.0 lb

## 2024-02-23 VITALS — BP 154/90 | HR 65 | Temp 97.9°F | Resp 18

## 2024-02-23 DIAGNOSIS — C184 Malignant neoplasm of transverse colon: Secondary | ICD-10-CM

## 2024-02-23 DIAGNOSIS — Z5111 Encounter for antineoplastic chemotherapy: Secondary | ICD-10-CM | POA: Diagnosis not present

## 2024-02-23 LAB — CMP (CANCER CENTER ONLY)
ALT: 19 U/L (ref 0–44)
AST: 22 U/L (ref 15–41)
Albumin: 4.3 g/dL (ref 3.5–5.0)
Alkaline Phosphatase: 90 U/L (ref 38–126)
Anion gap: 15 (ref 5–15)
BUN: 23 mg/dL (ref 8–23)
CO2: 22 mmol/L (ref 22–32)
Calcium: 10.4 mg/dL — ABNORMAL HIGH (ref 8.9–10.3)
Chloride: 105 mmol/L (ref 98–111)
Creatinine: 1.75 mg/dL — ABNORMAL HIGH (ref 0.44–1.00)
GFR, Estimated: 29 mL/min — ABNORMAL LOW (ref >60)
Glucose, Bld: 131 mg/dL — ABNORMAL HIGH (ref 70–99)
Potassium: 4.3 mmol/L (ref 3.5–5.1)
Sodium: 142 mmol/L (ref 135–145)
Total Bilirubin: 0.3 mg/dL (ref 0.0–1.2)
Total Protein: 7 g/dL (ref 6.5–8.1)

## 2024-02-23 LAB — CBC WITH DIFFERENTIAL (CANCER CENTER ONLY)
Abs Immature Granulocytes: 0.06 K/uL (ref 0.00–0.07)
Basophils Absolute: 0.1 K/uL (ref 0.0–0.1)
Basophils Relative: 1 %
Eosinophils Absolute: 0.2 K/uL (ref 0.0–0.5)
Eosinophils Relative: 4 %
HCT: 33.9 % — ABNORMAL LOW (ref 36.0–46.0)
Hemoglobin: 11.2 g/dL — ABNORMAL LOW (ref 12.0–15.0)
Immature Granulocytes: 1 %
Lymphocytes Relative: 37 %
Lymphs Abs: 1.8 K/uL (ref 0.7–4.0)
MCH: 31.7 pg (ref 26.0–34.0)
MCHC: 33 g/dL (ref 30.0–36.0)
MCV: 96 fL (ref 80.0–100.0)
Monocytes Absolute: 0.5 K/uL (ref 0.1–1.0)
Monocytes Relative: 9 %
Neutro Abs: 2.4 K/uL (ref 1.7–7.7)
Neutrophils Relative %: 48 %
Platelet Count: 180 K/uL (ref 150–400)
RBC: 3.53 MIL/uL — ABNORMAL LOW (ref 3.87–5.11)
RDW: 17 % — ABNORMAL HIGH (ref 11.5–15.5)
WBC Count: 5 K/uL (ref 4.0–10.5)
nRBC: 0 % (ref 0.0–0.2)

## 2024-02-23 MED ORDER — SODIUM CHLORIDE 0.9 % IV SOLN
200.0000 mg/m2 | Freq: Once | INTRAVENOUS | Status: AC
  Administered 2024-02-23: 370 mg via INTRAVENOUS
  Filled 2024-02-23 (×2): qty 18.5

## 2024-02-23 MED ORDER — SODIUM CHLORIDE 0.9 % IV SOLN: INTRAVENOUS | Status: DC

## 2024-02-23 MED ORDER — PROCHLORPERAZINE MALEATE 10 MG PO TABS
5.0000 mg | ORAL_TABLET | Freq: Once | ORAL | Status: AC
Start: 2024-02-23 — End: 2024-02-23
  Administered 2024-02-23: 5 mg via ORAL
  Filled 2024-02-23: qty 1

## 2024-02-23 MED ORDER — FLUOROURACIL CHEMO INJECTION 500 MG/10ML
200.0000 mg/m2 | Freq: Once | INTRAVENOUS | Status: AC
  Administered 2024-02-23: 350 mg via INTRAVENOUS
  Filled 2024-02-23: qty 7

## 2024-02-23 NOTE — Progress Notes (Signed)
 Patient seen by Dr. Arley Hof today  Vitals are within treatment parameters:Yes   Labs are within treatment parameters: No (Please specify and give further instructions.)  Creatinine 1.75--OK to proceed Treatment plan has been signed: Yes   Per physician team, Patient is ready for treatment and there are NO modifications to the treatment plan.

## 2024-02-23 NOTE — Patient Instructions (Signed)

## 2024-02-23 NOTE — Progress Notes (Signed)
 [facility] OFFICE PROGRESS NOTE   Diagnosis: Colon cancer  INTERVAL HISTORY:   [name] completed another cycle of 5-FU/leucovorin  on 02/16/2024.  No nausea/vomiting, mouth sores, or diarrhea.  No hand or foot pain.  She reports feeling better with the IV chemotherapy as opposed to capecitabine .  Objective:  Vital signs in last 24 hours:  Blood pressure 108/78, pulse 81, temperature 97.8 F (36.6 C), temperature source Temporal, resp. rate 18, height 5' 5 (1.651 m), weight 177 lb (80.3 kg), SpO2 98%.    HEENT: No thrush or ulcers Resp: Lungs clear bilaterally Cardio: Regular rate and rhythm GI: No hepatosplenomegaly, nontender Vascular: No leg edema  Skin: Palms without erythema  Portacath/PICC-without erythema  Lab Results:  Lab Results  Component Value Date   WBC 5.0 02/23/2024   HGB 11.2 (L) 02/23/2024   HCT 33.9 (L) 02/23/2024   MCV 96.0 02/23/2024   PLT 180 02/23/2024   NEUTROABS 2.4 02/23/2024    CMP  Lab Results  Component Value Date   NA 141 02/16/2024   K 4.4 02/16/2024   CL 106 02/16/2024   CO2 23 02/16/2024   GLUCOSE 110 (H) 02/16/2024   BUN 27 (H) 02/16/2024   CREATININE 1.59 (H) 02/16/2024   CALCIUM  10.2 02/16/2024   PROT 7.1 02/16/2024   ALBUMIN 4.3 02/16/2024   AST 20 02/16/2024   ALT 20 02/16/2024   ALKPHOS 90 02/16/2024   BILITOT 0.3 02/16/2024   GFRNONAA 32 (L) 02/16/2024   GFRAA 45 (L) 03/09/2018    Lab Results  Component Value Date   CEA1 42.7 (H) 08/02/2023   CEA 3.78 12/07/2023    Medications: I have reviewed the patient's current medications.   Assessment/Plan: Transverse colon cancer, stage IIIb (pT3,pN1b,cM0), status post a segmental transverse colectomy 08/03/2023 CT abdomen/pelvis 07/22/2023: Proximal colonic distention with transition point in the transverse colon, 4 cm lesion of the distal transverse colon with evidence of transmural extension/serosal component, small lymph nodes in the transverse  mesocolon, indeterminate 1.9 cm left adrenal nodule Colonoscopy 07/22/2023: Mass in the ascending colon biopsy-invasive moderately differentiated adenocarcinoma CT chest 08/02/2023: No evidence of metastatic disease, indeterminate left adrenal nodule Segmental transverse colectomy and colocolonic anastomosis 08/03/2023-moderately differentiated adenocarcinoma of the transverse colon, tumor extends into pericolonic adipose tissue, lymphovascular invasion present, 2/13 nodes Mismatch repair protein expression intact, MSI stable Elevated preoperative CEA 08/02/2023 (42.7) 08/31/2023 CEA normal (4.56) Cycle 1 capecitabine  09/08/2023 Cycle 2 capecitabine  09/29/2023 Cycle 3 capecitabine  10/20/2023, capecitabine  dose reduced to 1000 mg twice daily Cycle 4 capecitabine  11/18/2023, capecitabine  dose reduced to 1000 mg twice daily Cycle 5 capecitabine  12/16/2023, capecitabine  dose reduced to 1000 mg twice daily, discontinued day 8 due to recurrent dizziness Week one 5-FU/leucovorin  per the [place] regimen 01/12/2024 Week two 5-FU/leucovorin  per the [place] regimen 01/19/2024 Week three 5-FU/leucovorin  per [place] regimen, 5-FU dose reduced for neutropenia 01/26/2024 Treatment held 02/02/2024 and 02/09/2024 due to neutropenia Week 4 5-FU/leucovorin  02/16/2024 Week 5-FU leucovorin  02/23/2024   Indeterminate left adrenal nodule on CT abdomen/pelvis 07/22/2023 History of colonic polyps Hypertension For lipidemia Asthma History of H. 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  The neutrophil count is normal today.   She will complete another treatment with 5-FU leucovorin  today and again next week.  She will return for an office visit on 03/15/2024 to begin the final 6-week cycle of 5-FU/leucovorin .  Arley Hof, MD  02/23/2024  11:10 AM

## 2024-02-23 NOTE — Patient Instructions (Signed)
 [facility] - A DEPT OF [facility]  Discharge Instructions: Thank you for choosing [facility] to provide your oncology and hematology care.   If you have a lab appointment with the Cancer Center, please go directly to the Cancer Center and check in at the registration area.   Wear comfortable clothing and clothing appropriate for easy access to any Portacath or PICC line.   We strive to give you quality time with your provider. You may need to reschedule your appointment if you arrive late (15 or more minutes).  Arriving late affects you and other patients whose appointments are after yours.  Also, if you miss three or more appointments without notifying the office, you may be dismissed from the clinic at the provider's discretion.      For prescription refill requests, have your pharmacy contact our office and allow 72 hours for refills to be completed.    Today you received the following chemotherapy and/or immunotherapy agents: Leucovorin  and Fluorouracil  (5-FU)      To help prevent nausea and vomiting after your treatment, we encourage you to take your nausea medication as directed.  BELOW ARE SYMPTOMS THAT SHOULD BE REPORTED IMMEDIATELY: *FEVER GREATER THAN 100.4 F (38 C) OR HIGHER *CHILLS OR SWEATING *NAUSEA AND VOMITING THAT IS NOT CONTROLLED WITH YOUR NAUSEA MEDICATION *UNUSUAL SHORTNESS OF BREATH *UNUSUAL BRUISING OR BLEEDING *URINARY PROBLEMS (pain or burning when urinating, or frequent urination) *BOWEL PROBLEMS (unusual diarrhea, constipation, pain near the anus) TENDERNESS IN MOUTH AND THROAT WITH OR WITHOUT PRESENCE OF ULCERS (sore throat, sores in mouth, or a toothache) UNUSUAL RASH, SWELLING OR PAIN  UNUSUAL VAGINAL DISCHARGE OR ITCHING   Items with * indicate a potential emergency and should be followed up as soon as possible or go to the Emergency Department if any problems should occur.  Please show the CHEMOTHERAPY  ALERT CARD or IMMUNOTHERAPY ALERT CARD at check-in to the Emergency Department and triage nurse.  Should you have questions after your visit or need to cancel or reschedule your appointment, please contact [facility] - A DEPT OF [facility]  Dept: [phone]  and follow the prompts.  Office hours are 8:00 a.m. to 4:30 p.m. Monday - Friday. Please note that voicemails left after 4:00 p.m. may not be returned until the following business day.  We are closed weekends and major holidays. You have access to a nurse at all times for urgent questions. Please call the main number to the clinic Dept: [phone] and follow the prompts.   For any non-urgent questions, you may also contact your provider using MyChart. We now offer e-Visits for anyone 57 and older to request care online for non-urgent symptoms. For details visit [email or web address].   Also download the MyChart app! Go to the app store, search MyChart, open the app, select [place], and log in with your MyChart username and password.

## 2024-02-24 ENCOUNTER — Other Ambulatory Visit: Payer: Self-pay

## 2024-02-27 ENCOUNTER — Other Ambulatory Visit: Payer: Self-pay

## 2024-02-27 NOTE — Progress Notes (Signed)
 Disenrolling patient from prgram, she has stopped her Xeloda .  Lucie Lamer, CPhT [place]  [facility] Pharmacy Technician Patient Advocate Specialist [facility] [place] Phone: [phone]  Fax: [phone] [email or web address]

## 2024-02-28 ENCOUNTER — Encounter: Payer: Self-pay | Admitting: Oncology

## 2024-03-01 ENCOUNTER — Ambulatory Visit

## 2024-03-01 ENCOUNTER — Inpatient Hospital Stay

## 2024-03-01 ENCOUNTER — Encounter: Payer: Self-pay | Admitting: Oncology

## 2024-03-01 ENCOUNTER — Ambulatory Visit: Admitting: Nurse Practitioner

## 2024-03-01 ENCOUNTER — Encounter: Payer: Self-pay | Admitting: Nurse Practitioner

## 2024-03-01 ENCOUNTER — Inpatient Hospital Stay (HOSPITAL_BASED_OUTPATIENT_CLINIC_OR_DEPARTMENT_OTHER): Admitting: Nurse Practitioner

## 2024-03-01 VITALS — BP 137/78 | HR 68 | Temp 98.2°F | Resp 18

## 2024-03-01 VITALS — BP 137/78 | HR 68 | Temp 98.2°F | Resp 18 | Wt 179.9 lb

## 2024-03-01 DIAGNOSIS — C184 Malignant neoplasm of transverse colon: Secondary | ICD-10-CM

## 2024-03-01 DIAGNOSIS — Z5111 Encounter for antineoplastic chemotherapy: Secondary | ICD-10-CM | POA: Diagnosis not present

## 2024-03-01 LAB — CMP (CANCER CENTER ONLY)
ALT: 18 U/L (ref 0–44)
AST: 20 U/L (ref 15–41)
Albumin: 4.4 g/dL (ref 3.5–5.0)
Alkaline Phosphatase: 86 U/L (ref 38–126)
Anion gap: 13 (ref 5–15)
BUN: 22 mg/dL (ref 8–23)
CO2: 22 mmol/L (ref 22–32)
Calcium: 10.5 mg/dL — ABNORMAL HIGH (ref 8.9–10.3)
Chloride: 107 mmol/L (ref 98–111)
Creatinine: 1.55 mg/dL — ABNORMAL HIGH (ref 0.44–1.00)
GFR, Estimated: 33 mL/min — ABNORMAL LOW (ref >60)
Glucose, Bld: 128 mg/dL — ABNORMAL HIGH (ref 70–99)
Potassium: 3.9 mmol/L (ref 3.5–5.1)
Sodium: 141 mmol/L (ref 135–145)
Total Bilirubin: 0.4 mg/dL (ref 0.0–1.2)
Total Protein: 7.1 g/dL (ref 6.5–8.1)

## 2024-03-01 LAB — CBC WITH DIFFERENTIAL (CANCER CENTER ONLY)
Abs Immature Granulocytes: 0.01 K/uL (ref 0.00–0.07)
Basophils Absolute: 0.1 K/uL (ref 0.0–0.1)
Basophils Relative: 1 %
Eosinophils Absolute: 0.2 K/uL (ref 0.0–0.5)
Eosinophils Relative: 4 %
HCT: 33.9 % — ABNORMAL LOW (ref 36.0–46.0)
Hemoglobin: 11.4 g/dL — ABNORMAL LOW (ref 12.0–15.0)
Immature Granulocytes: 0 %
Lymphocytes Relative: 33 %
Lymphs Abs: 1.4 K/uL (ref 0.7–4.0)
MCH: 31.8 pg (ref 26.0–34.0)
MCHC: 33.6 g/dL (ref 30.0–36.0)
MCV: 94.4 fL (ref 80.0–100.0)
Monocytes Absolute: 0.3 K/uL (ref 0.1–1.0)
Monocytes Relative: 8 %
Neutro Abs: 2.3 K/uL (ref 1.7–7.7)
Neutrophils Relative %: 54 %
Platelet Count: 162 K/uL (ref 150–400)
RBC: 3.59 MIL/uL — ABNORMAL LOW (ref 3.87–5.11)
RDW: 17.4 % — ABNORMAL HIGH (ref 11.5–15.5)
WBC Count: 4.3 K/uL (ref 4.0–10.5)
nRBC: 0 % (ref 0.0–0.2)

## 2024-03-01 MED ORDER — SODIUM CHLORIDE 0.9 % IV SOLN: INTRAVENOUS | Status: DC

## 2024-03-01 MED ORDER — PROCHLORPERAZINE MALEATE 10 MG PO TABS
5.0000 mg | ORAL_TABLET | Freq: Once | ORAL | Status: AC
  Administered 2024-03-01: 5 mg via ORAL
  Filled 2024-03-01: qty 1

## 2024-03-01 MED ORDER — SODIUM CHLORIDE 0.9 % IV SOLN
200.0000 mg/m2 | Freq: Once | INTRAVENOUS | Status: AC
  Administered 2024-03-01: 370 mg via INTRAVENOUS
  Filled 2024-03-01: qty 18.5

## 2024-03-01 MED ORDER — SODIUM CHLORIDE 0.9% FLUSH: 10.0000 mL | INTRAVENOUS | Status: DC | PRN

## 2024-03-01 MED ORDER — FAMOTIDINE 20 MG PO TABS
20.0000 mg | ORAL_TABLET | Freq: Once | ORAL | Status: AC
  Administered 2024-03-01: 20 mg via ORAL
  Filled 2024-03-01: qty 1

## 2024-03-01 MED ORDER — FLUOROURACIL CHEMO INJECTION 500 MG/10ML
200.0000 mg/m2 | Freq: Once | INTRAVENOUS | Status: AC
  Administered 2024-03-01: 350 mg via INTRAVENOUS
  Filled 2024-03-01: qty 7

## 2024-03-01 NOTE — Patient Instructions (Signed)
 [facility] - A DEPT OF [facility]  Discharge Instructions: Thank you for choosing [facility] to provide your oncology and hematology care.   If you have a lab appointment with the Cancer Center, please go directly to the Cancer Center and check in at the registration area.   Wear comfortable clothing and clothing appropriate for easy access to any Portacath or PICC line.   We strive to give you quality time with your provider. You may need to reschedule your appointment if you arrive late (15 or more minutes).  Arriving late affects you and other patients whose appointments are after yours.  Also, if you miss three or more appointments without notifying the office, you may be dismissed from the clinic at the provider's discretion.      For prescription refill requests, have your pharmacy contact our office and allow 72 hours for refills to be completed.    Today you received the following chemotherapy and/or immunotherapy agents: Leucovorin  and Fluorouracil  (5-FU)      To help prevent nausea and vomiting after your treatment, we encourage you to take your nausea medication as directed.  BELOW ARE SYMPTOMS THAT SHOULD BE REPORTED IMMEDIATELY: *FEVER GREATER THAN 100.4 F (38 C) OR HIGHER *CHILLS OR SWEATING *NAUSEA AND VOMITING THAT IS NOT CONTROLLED WITH YOUR NAUSEA MEDICATION *UNUSUAL SHORTNESS OF BREATH *UNUSUAL BRUISING OR BLEEDING *URINARY PROBLEMS (pain or burning when urinating, or frequent urination) *BOWEL PROBLEMS (unusual diarrhea, constipation, pain near the anus) TENDERNESS IN MOUTH AND THROAT WITH OR WITHOUT PRESENCE OF ULCERS (sore throat, sores in mouth, or a toothache) UNUSUAL RASH, SWELLING OR PAIN  UNUSUAL VAGINAL DISCHARGE OR ITCHING   Items with * indicate a potential emergency and should be followed up as soon as possible or go to the Emergency Department if any problems should occur.  Please show the CHEMOTHERAPY  ALERT CARD or IMMUNOTHERAPY ALERT CARD at check-in to the Emergency Department and triage nurse.  Should you have questions after your visit or need to cancel or reschedule your appointment, please contact [facility] - A DEPT OF [facility]  Dept: [phone]  and follow the prompts.  Office hours are 8:00 a.m. to 4:30 p.m. Monday - Friday. Please note that voicemails left after 4:00 p.m. may not be returned until the following business day.  We are closed weekends and major holidays. You have access to a nurse at all times for urgent questions. Please call the main number to the clinic Dept: [phone] and follow the prompts.   For any non-urgent questions, you may also contact your provider using MyChart. We now offer e-Visits for anyone 57 and older to request care online for non-urgent symptoms. For details visit [email or web address].   Also download the MyChart app! Go to the app store, search MyChart, open the app, select [place], and log in with your MyChart username and password.

## 2024-03-01 NOTE — Progress Notes (Signed)
 [facility] OFFICE PROGRESS NOTE   Diagnosis: Colon cancer  INTERVAL HISTORY:   Adrienne Rogers is completing another cycle of 5-FU/leucovorin  today.  She reported lip swelling to the nurse and evaluation has been requested.  Adrienne Rogers reports waking up with swollen lips each of the past 4 mornings.  The swelling resolves during the course of the day.  She has been eating tomatoes multiple times a day this week and wonders if the swelling is related to that.  No pruritus involving the mouth or lips.  She denies tongue swelling, throat tightness, hives, shortness of breath, wheezing.  No mouth or lip sores.  No nausea or vomiting.  She has periodic loose stools.  No hand or foot pain or redness.  She notes a rash at the gluteal fold.  No dizziness.  She is on no new medications.  She does not take an ACE inhibitor.  Objective:  Vital signs in last 24 hours:  Blood pressure 137/78, pulse 68, temperature 98.2 F (36.8 C), temperature source Temporal, resp. rate 18, weight 179 lb 14.4 oz (81.6 kg), SpO2 100%.    HEENT: No significant lip swelling.  No ulcerations on the lips or in the mouth. Resp: Lungs clear bilaterally. Cardio: Regular rate and rhythm. GI: No hepatosplenomegaly. Vascular: No leg edema. Skin: Palms without erythema.  Mild erythema inner gluteal fold, no ulceration. Port-A-Cath without erythema.  Lab Results:  Lab Results  Component Value Date   WBC 4.3 03/01/2024   HGB 11.4 (L) 03/01/2024   HCT 33.9 (L) 03/01/2024   MCV 94.4 03/01/2024   PLT 162 03/01/2024   NEUTROABS 2.3 03/01/2024    Imaging:  No results found.  Medications: I have reviewed the patient's current medications.  Assessment/Plan: Transverse colon cancer, stage IIIb (pT3,pN1b,cM0), status post a segmental transverse colectomy 08/03/2023 CT abdomen/pelvis 07/22/2023: Proximal colonic distention with transition point in the transverse colon, 4 cm lesion of the distal transverse  colon with evidence of transmural extension/serosal component, small lymph nodes in the transverse mesocolon, indeterminate 1.9 cm left adrenal nodule Colonoscopy 07/22/2023: Mass in the ascending colon biopsy-invasive moderately differentiated adenocarcinoma CT chest 08/02/2023: No evidence of metastatic disease, indeterminate left adrenal nodule Segmental transverse colectomy and colocolonic anastomosis 08/03/2023-moderately differentiated adenocarcinoma of the transverse colon, tumor extends into pericolonic adipose tissue, lymphovascular invasion present, 2/13 nodes Mismatch repair protein expression intact, MSI stable Elevated preoperative CEA 08/02/2023 (42.7) 08/31/2023 CEA normal (4.56) Cycle 1 capecitabine  09/08/2023 Cycle 2 capecitabine  09/29/2023 Cycle 3 capecitabine  10/20/2023, capecitabine  dose reduced to 1000 mg twice daily Cycle 4 capecitabine  11/18/2023, capecitabine  dose reduced to 1000 mg twice daily Cycle 5 capecitabine  12/16/2023, capecitabine  dose reduced to 1000 mg twice daily, discontinued day 8 due to recurrent dizziness Week one 5-FU/leucovorin  per the [place] regimen 01/12/2024 Week two 5-FU/leucovorin  per the [place] regimen 01/19/2024 Week three 5-FU/leucovorin  per [place] regimen, 5-FU dose reduced for neutropenia 01/26/2024 Treatment held 02/02/2024 and 02/09/2024 due to neutropenia Week 4 5-FU/leucovorin  02/16/2024 Week 5-FU leucovorin  02/23/2024   Indeterminate left adrenal nodule on CT abdomen/pelvis 07/22/2023 History of colonic polyps Hypertension For lipidemia Asthma History of H. 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  Rogers  appears stable.  She is currently completing week 6 5-FU/leucovorin .  She reported intermittent lip swelling to the nurse for the past 4 days.  The swelling has been present when she wakes up in the morning and resolves during the course of the day with no intervention.  No other symptoms to suggest an allergic type reaction.  There is no evidence of oral mucositis.  Lips do not appear significantly edematous at present.  Plan for Pepcid  20 mg p.o. x 1 while in the office.  She will take Benadryl  when she gets home.  We discussed seeking evaluation if lips continue to swell or she develops any other concerning symptoms such as throat tightness, hives, shortness of breath.  She will contact the office in the morning with an update.  She is scheduled to return for routine follow-up on 03/15/2024.  We are available to see her sooner if needed.  Plan reviewed with Dr. Cloretta.    Olam Ned ANP/GNP-BC   03/01/2024  2:35 PM

## 2024-03-01 NOTE — Progress Notes (Signed)
 Patient informed infusion nurse that her lips have been swollen and tender for 4 days. Brought to exam room for provider visit.

## 2024-03-02 ENCOUNTER — Telehealth: Payer: Self-pay | Admitting: *Deleted

## 2024-03-02 NOTE — Telephone Encounter (Signed)
 Called patient to f/u on status of lip swelling. She reports it has improved today with no other symptoms. She agrees to call if swelling resumes or any SOB, throat tightness or hives.

## 2024-03-06 ENCOUNTER — Encounter: Payer: Self-pay | Admitting: Oncology

## 2024-03-14 NOTE — Progress Notes (Unsigned)
 [facility]   Telephone:(336) [phone] [phone]    Patient Care Team: Beverley Louann DASEN, MD as PCP - General (Internal Medicine) Kristie Lamprey, MD as Consulting Physician (Gastroenterology)   CHIEF COMPLAINT: Follow up colon cancer   CURRENT THERAPY: 5FU [place] regimen   INTERVAL HISTORY Ms. Szalkowski returns for follow up as scheduled. Last seen 8/28 with cycle 6 treatment. She feels better since she had a break but overall has gradual increased leg pain and weakness. Having some difficulty standing to prepare meals so will seat in walker to get things done. Has arthritis and a knee replacement in the past. Denies numbness. DOE stable without new/worsening cough, chest pain. Continues anticoagulation. Bowels moving, no n/v, fever, chills, or any other new/specific complaints.   ROS  All other systems reviewed and negative   Past Medical History:  Diagnosis Date   Asthma    COPD (chronic obstructive pulmonary disease) (HCC)    Depression    Hyperlipidemia    Hypertension    PTSD (post-traumatic stress disorder)      Past Surgical History:  Procedure Laterality Date   ABDOMINAL HYSTERECTOMY     1981   BACK SURGERY     ACDF 2005   BIOPSY  11/10/2021   Procedure: BIOPSY;  Surgeon: Rollin Dover, MD;  Location: [facility];  Service: Gastroenterology;;   CHOLECYSTECTOMY     ESOPHAGOGASTRODUODENOSCOPY N/A 11/10/2021   Procedure: ESOPHAGOGASTRODUODENOSCOPY (EGD);  Surgeon: Rollin Dover, MD;  Location: [facility];  Service: Gastroenterology;  Laterality: N/A;  IDA, Melena   IR IMAGING GUIDED PORT INSERTION  01/11/2024   PARTIAL COLECTOMY N/A 08/03/2023   Procedure: OPEN PARTIAL COLECTOMY;  Surgeon: Signe Mitzie LABOR, MD;  Location: [facility];  Service: General;  Laterality: N/A;   right knee arthroscopy     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   albuterol  (VENTOLIN  HFA) 108 (90 Base) MCG/ACT inhaler Inhale 2 puffs into the lungs every 6 (six) hours as needed for wheezing or shortness of breath.   APIXABAN  (ELIQUIS ) VTE STARTER PACK (10MG  AND 5MG ) Take as directed on package: start with two-5mg  tablets twice daily for 7 days. On day 8, switch to one-5mg  tablet twice daily.   carvedilol  (COREG ) 25 MG tablet Take 0.5 tablets (12.5 mg total) by mouth 2 (two) times daily.   Cholecalciferol  25 MCG (1000 UT) tablet Take 1,000 Units by mouth daily.   docusate sodium  (COLACE) 100 MG capsule Take 1 capsule (100 mg total) by mouth 2 (two) times daily as needed for mild constipation.   ferrous sulfate  325 (65 FE) MG tablet Take 325 mg by mouth daily with breakfast.   fluticasone  (FLONASE) 50 MCG/ACT nasal spray Place 2 sprays into the nose daily.   ketotifen  (ZADITOR ) 0.035 % ophthalmic solution Place 1 drop into both eyes in the morning and at bedtime.   lidocaine -prilocaine  (EMLA ) cream Apply 1 Application topically as needed.   pantoprazole  (PROTONIX ) 40 MG tablet Take 40 mg by mouth 2 (two) times daily before a meal.   polyethylene glycol (MIRALAX  / GLYCOLAX ) 17 g  packet Take 17 g by mouth 2 (two) times daily as needed.   sertraline  (ZOLOFT ) 100 MG tablet Take 200 mg by mouth daily.   simvastatin  (ZOCOR ) 40 MG tablet Take 40 mg by mouth at bedtime.    spironolactone  (ALDACTONE ) 25 MG tablet Take 25 mg by mouth daily.   Tiotropium Bromide  Monohydrate 2.5 MCG/ACT AERS Take 2 puffs by mouth daily.   traZODone  (DESYREL ) 100 MG tablet Take 100 mg by mouth at bedtime.   WIXELA INHUB 250-50 MCG/ACT AEPB Inhale 1 puff into the lungs 2 (two) times daily.   Meclizine HCl 25 MG  CHEW Chew 25 mg by mouth as needed. (Patient not taking: Reported on 03/15/2024)   methocarbamol  (ROBAXIN ) 500 MG tablet Take 1 tablet (500 mg total) by mouth every 6 (six) hours as needed for muscle spasms. (Patient not taking: Reported on 03/15/2024)   No facility-administered encounter medications on file as of 03/15/2024.     Today's Vitals   03/15/24 1100 03/15/24 1118  BP:  101/68  Pulse:  72  Resp:  18  Temp:  97.8 F (36.6 C)  TempSrc:  Temporal  SpO2:  100%  Weight:  178 lb 14.4 oz (81.1 kg)  Height:  5' 5 (1.651 m)  PainSc: 3     Body mass index is 29.77 kg/m.   ECOG PERFORMANCE STATUS: 1 - Symptomatic but completely ambulatory  PHYSICAL EXAM GENERAL:alert, no distress and comfortable SKIN: no rash  EYES: sclera clear NECK: without mass LYMPH:  no palpable cervical or supraclavicular lymphadenopathy  LUNGS: clear with normal breathing effort HEART: regular rate & rhythm, no lower extremity edema ABDOMEN: abdomen soft, non-tender and normal bowel sounds NEURO: alert & oriented x 3 with fluent speech, no focal motor/sensory deficits PAC without erythema    CBC    Latest Ref Rng & Units 03/15/2024   11:15 AM 03/01/2024   10:42 AM 02/23/2024   10:41 AM  CBC  WBC 4.0 - 10.5 K/uL 3.9  4.3  5.0   Hemoglobin 12.0 - 15.0 g/dL 88.6  88.5  88.7   Hematocrit 36.0 - 46.0 % 34.0  33.9  33.9   Platelets 150 - 400 K/uL 162  162  180       CMP     Latest Ref Rng & Units 03/15/2024   11:15 AM 03/01/2024   10:42 AM 02/23/2024   10:41 AM  CMP  Glucose 70 - 99 mg/dL 880  871  868   BUN 8 - 23 mg/dL 27  22  23    Creatinine 0.44 - 1.00 mg/dL 8.31  8.44  8.24   Sodium 135 - 145 mmol/L 142  141  142   Potassium 3.5 - 5.1 mmol/L 4.1  3.9  4.3   Chloride 98 - 111 mmol/L 107  107  105   CO2 22 - 32 mmol/L 21  22  22    Calcium  8.9 - 10.3 mg/dL 89.8  89.4  89.5   Total Protein 6.5 - 8.1 g/dL 7.2  7.1  7.0   Total Bilirubin 0.0 - 1.2 mg/dL 0.4  0.4  0.3   Alkaline Phos 38 - 126  U/L 87  86  90   AST 15 - 41 U/L 19  20  22    ALT 0 - 44 U/L 17  18  19        ASSESSMENT & PLAN: Assessment/Plan: Transverse colon cancer, stage IIIb (pT3,pN1b,cM0), status post a segmental transverse colectomy 08/03/2023 CT abdomen/pelvis 07/22/2023: Proximal  colonic distention with transition point in the transverse colon, 4 cm lesion of the distal transverse colon with evidence of transmural extension/serosal component, small lymph nodes in the transverse mesocolon, indeterminate 1.9 cm left adrenal nodule Colonoscopy 07/22/2023: Mass in the ascending colon biopsy-invasive moderately differentiated adenocarcinoma CT chest 08/02/2023: No evidence of metastatic disease, indeterminate left adrenal nodule Segmental transverse colectomy and colocolonic anastomosis 08/03/2023-moderately differentiated adenocarcinoma of the transverse colon, tumor extends into pericolonic adipose tissue, lymphovascular invasion present, 2/13 nodes Mismatch repair protein expression intact, MSI stable Elevated preoperative CEA 08/02/2023 (42.7) 08/31/2023 CEA normal (4.56) Cycle 1 capecitabine  09/08/2023 Cycle 2 capecitabine  09/29/2023 Cycle 3 capecitabine  10/20/2023, capecitabine  dose reduced to 1000 mg twice daily Cycle 4 capecitabine  11/18/2023, capecitabine  dose reduced to 1000 mg twice daily Cycle 5 capecitabine  12/16/2023, capecitabine  dose reduced to 1000 mg twice daily, discontinued day 8 due to recurrent dizziness Cycle 1 Week one 5-FU/leucovorin  per the [place] regimen 01/12/2024 Week two 5-FU/leucovorin  01/19/2024 Week three 5-FU/leucovorin , 5-FU dose reduced for neutropenia 01/26/2024 Treatment held 02/02/2024 and 02/09/2024 due to neutropenia Week four 5-FU/leucovorin  02/16/2024 Week five 5-FU /leucovorin  02/23/2024 Week six 5-FU/leucovorin  03/01/2024 Cycle 2 week one 5-FU/leucovorin  per [place] regimen 03/15/24   Indeterminate left adrenal nodule on CT abdomen/pelvis 07/22/2023 History of colonic  polyps Hypertension Hyperlipidemia Asthma History of H. 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 Streets appears stable. She has completed one 6-week cycle of 5FU/leuc per the [place] regimen. She tolerated well overall. Able to recover and function with adequate PS.    She has progressive leg pain and weakness with a history of arthritis and knee replacement. No osseous pathology on last scan. This is possibly indirectly secondary to fatigue from chemo. Will try PT for strengthening. Otherwise tolerating well.   Labs reviewed, adequate for treatment. She will proceed with week 1 of a second 6-week cycle. No dose adjustments.   Follow up and week 2 on 9/18 as scheduled, or sooner if needed.       All questions were answered. The patient knows to call the clinic with any problems, questions or concerns. No barriers to learning were detected.   Tria Noguera K Tylon Kemmerling, NP 03/15/2024

## 2024-03-15 ENCOUNTER — Inpatient Hospital Stay (HOSPITAL_BASED_OUTPATIENT_CLINIC_OR_DEPARTMENT_OTHER): Admitting: Nurse Practitioner

## 2024-03-15 ENCOUNTER — Inpatient Hospital Stay: Attending: Oncology

## 2024-03-15 ENCOUNTER — Inpatient Hospital Stay

## 2024-03-15 ENCOUNTER — Encounter: Payer: Self-pay | Admitting: Nurse Practitioner

## 2024-03-15 VITALS — BP 158/80 | HR 55 | Temp 97.2°F | Resp 18

## 2024-03-15 VITALS — BP 101/68 | HR 72 | Temp 97.8°F | Resp 18 | Ht 65.0 in | Wt 178.9 lb

## 2024-03-15 DIAGNOSIS — E278 Other specified disorders of adrenal gland: Secondary | ICD-10-CM | POA: Insufficient documentation

## 2024-03-15 DIAGNOSIS — Z5111 Encounter for antineoplastic chemotherapy: Secondary | ICD-10-CM | POA: Diagnosis present

## 2024-03-15 DIAGNOSIS — N289 Disorder of kidney and ureter, unspecified: Secondary | ICD-10-CM | POA: Diagnosis not present

## 2024-03-15 DIAGNOSIS — Z800 Family history of malignant neoplasm of digestive organs: Secondary | ICD-10-CM | POA: Insufficient documentation

## 2024-03-15 DIAGNOSIS — E785 Hyperlipidemia, unspecified: Secondary | ICD-10-CM | POA: Insufficient documentation

## 2024-03-15 DIAGNOSIS — I82532 Chronic embolism and thrombosis of left popliteal vein: Secondary | ICD-10-CM | POA: Diagnosis not present

## 2024-03-15 DIAGNOSIS — I82512 Chronic embolism and thrombosis of left femoral vein: Secondary | ICD-10-CM | POA: Insufficient documentation

## 2024-03-15 DIAGNOSIS — C184 Malignant neoplasm of transverse colon: Secondary | ICD-10-CM

## 2024-03-15 DIAGNOSIS — Z86711 Personal history of pulmonary embolism: Secondary | ICD-10-CM | POA: Insufficient documentation

## 2024-03-15 DIAGNOSIS — Z23 Encounter for immunization: Secondary | ICD-10-CM | POA: Diagnosis not present

## 2024-03-15 DIAGNOSIS — Z860100 Personal history of colon polyps, unspecified: Secondary | ICD-10-CM | POA: Insufficient documentation

## 2024-03-15 DIAGNOSIS — I10 Essential (primary) hypertension: Secondary | ICD-10-CM | POA: Diagnosis not present

## 2024-03-15 DIAGNOSIS — Z9049 Acquired absence of other specified parts of digestive tract: Secondary | ICD-10-CM | POA: Diagnosis not present

## 2024-03-15 DIAGNOSIS — Z8619 Personal history of other infectious and parasitic diseases: Secondary | ICD-10-CM | POA: Insufficient documentation

## 2024-03-15 DIAGNOSIS — D709 Neutropenia, unspecified: Secondary | ICD-10-CM | POA: Diagnosis not present

## 2024-03-15 LAB — CBC WITH DIFFERENTIAL (CANCER CENTER ONLY)
Abs Immature Granulocytes: 0 K/uL (ref 0.00–0.07)
Basophils Absolute: 0.1 K/uL (ref 0.0–0.1)
Basophils Relative: 1 %
Eosinophils Absolute: 0.2 K/uL (ref 0.0–0.5)
Eosinophils Relative: 6 %
HCT: 34 % — ABNORMAL LOW (ref 36.0–46.0)
Hemoglobin: 11.3 g/dL — ABNORMAL LOW (ref 12.0–15.0)
Immature Granulocytes: 0 %
Lymphocytes Relative: 42 %
Lymphs Abs: 1.6 K/uL (ref 0.7–4.0)
MCH: 32 pg (ref 26.0–34.0)
MCHC: 33.2 g/dL (ref 30.0–36.0)
MCV: 96.3 fL (ref 80.0–100.0)
Monocytes Absolute: 0.3 K/uL (ref 0.1–1.0)
Monocytes Relative: 8 %
Neutro Abs: 1.7 K/uL (ref 1.7–7.7)
Neutrophils Relative %: 43 %
Platelet Count: 162 K/uL (ref 150–400)
RBC: 3.53 MIL/uL — ABNORMAL LOW (ref 3.87–5.11)
RDW: 18.1 % — ABNORMAL HIGH (ref 11.5–15.5)
WBC Count: 3.9 K/uL — ABNORMAL LOW (ref 4.0–10.5)
nRBC: 0 % (ref 0.0–0.2)

## 2024-03-15 LAB — CMP (CANCER CENTER ONLY)
ALT: 17 U/L (ref 0–44)
AST: 19 U/L (ref 15–41)
Albumin: 4.4 g/dL (ref 3.5–5.0)
Alkaline Phosphatase: 87 U/L (ref 38–126)
Anion gap: 13 (ref 5–15)
BUN: 27 mg/dL — ABNORMAL HIGH (ref 8–23)
CO2: 21 mmol/L — ABNORMAL LOW (ref 22–32)
Calcium: 10.1 mg/dL (ref 8.9–10.3)
Chloride: 107 mmol/L (ref 98–111)
Creatinine: 1.68 mg/dL — ABNORMAL HIGH (ref 0.44–1.00)
GFR, Estimated: 30 mL/min — ABNORMAL LOW (ref >60)
Glucose, Bld: 119 mg/dL — ABNORMAL HIGH (ref 70–99)
Potassium: 4.1 mmol/L (ref 3.5–5.1)
Sodium: 142 mmol/L (ref 135–145)
Total Bilirubin: 0.4 mg/dL (ref 0.0–1.2)
Total Protein: 7.2 g/dL (ref 6.5–8.1)

## 2024-03-15 MED ORDER — SODIUM CHLORIDE 0.9 % IV SOLN: INTRAVENOUS | Status: DC

## 2024-03-15 MED ORDER — SODIUM CHLORIDE 0.9 % IV SOLN
200.0000 mg/m2 | Freq: Once | INTRAVENOUS | Status: AC
  Administered 2024-03-15: 370 mg via INTRAVENOUS
  Filled 2024-03-15: qty 18.5

## 2024-03-15 MED ORDER — FLUOROURACIL CHEMO INJECTION 500 MG/10ML
200.0000 mg/m2 | Freq: Once | INTRAVENOUS | Status: AC
  Administered 2024-03-15: 350 mg via INTRAVENOUS
  Filled 2024-03-15: qty 7

## 2024-03-15 MED ORDER — PROCHLORPERAZINE MALEATE 10 MG PO TABS
5.0000 mg | ORAL_TABLET | Freq: Once | ORAL | Status: AC
  Administered 2024-03-15: 5 mg via ORAL
  Filled 2024-03-15: qty 1

## 2024-03-15 NOTE — Patient Instructions (Signed)
 [facility] - A DEPT OF [facility]  Discharge Instructions: Thank you for choosing [facility] to provide your oncology and hematology care.   If you have a lab appointment with the Cancer Center, please go directly to the Cancer Center and check in at the registration area.   Wear comfortable clothing and clothing appropriate for easy access to any Portacath or PICC line.   We strive to give you quality time with your provider. You may need to reschedule your appointment if you arrive late (15 or more minutes).  Arriving late affects you and other patients whose appointments are after yours.  Also, if you miss three or more appointments without notifying the office, you may be dismissed from the clinic at the provider's discretion.      For prescription refill requests, have your pharmacy contact our office and allow 72 hours for refills to be completed.    Today you received the following chemotherapy and/or immunotherapy agents: leucovorin , fluorouracil        To help prevent nausea and vomiting after your treatment, we encourage you to take your nausea medication as directed.  BELOW ARE SYMPTOMS THAT SHOULD BE REPORTED IMMEDIATELY: *FEVER GREATER THAN 100.4 F (38 C) OR HIGHER *CHILLS OR SWEATING *NAUSEA AND VOMITING THAT IS NOT CONTROLLED WITH YOUR NAUSEA MEDICATION *UNUSUAL SHORTNESS OF BREATH *UNUSUAL BRUISING OR BLEEDING *URINARY PROBLEMS (pain or burning when urinating, or frequent urination) *BOWEL PROBLEMS (unusual diarrhea, constipation, pain near the anus) TENDERNESS IN MOUTH AND THROAT WITH OR WITHOUT PRESENCE OF ULCERS (sore throat, sores in mouth, or a toothache) UNUSUAL RASH, SWELLING OR PAIN  UNUSUAL VAGINAL DISCHARGE OR ITCHING   Items with * indicate a potential emergency and should be followed up as soon as possible or go to the Emergency Department if any problems should occur.  Please show the CHEMOTHERAPY ALERT CARD  or IMMUNOTHERAPY ALERT CARD at check-in to the Emergency Department and triage nurse.  Should you have questions after your visit or need to cancel or reschedule your appointment, please contact [facility] - A DEPT OF [facility]  Dept: [phone]  and follow the prompts.  Office hours are 8:00 a.m. to 4:30 p.m. Monday - Friday. Please note that voicemails left after 4:00 p.m. may not be returned until the following business day.  We are closed weekends and major holidays. You have access to a nurse at all times for urgent questions. Please call the main number to the clinic Dept: [phone] and follow the prompts.   For any non-urgent questions, you may also contact your provider using MyChart. We now offer e-Visits for anyone 42 and older to request care online for non-urgent symptoms. For details visit [email or web address].   Also download the MyChart app! Go to the app store, search MyChart, open the app, select [place], and log in with your MyChart username and password.

## 2024-03-15 NOTE — Progress Notes (Signed)
 Patient seen by Lacie Burton, NP today  Vitals are within treatment parameters:Yes   Labs are within treatment parameters: Yes Creatinine 1.68 its okay to proceed  Treatment plan has been signed: Yes   Per physician team, Patient is ready for treatment and there are NO modifications to the treatment plan.

## 2024-03-15 NOTE — Patient Instructions (Signed)

## 2024-03-18 ENCOUNTER — Other Ambulatory Visit: Payer: Self-pay | Admitting: Oncology

## 2024-03-18 ENCOUNTER — Other Ambulatory Visit: Payer: Self-pay

## 2024-03-18 DIAGNOSIS — C184 Malignant neoplasm of transverse colon: Secondary | ICD-10-CM

## 2024-03-22 ENCOUNTER — Other Ambulatory Visit: Payer: Self-pay

## 2024-03-22 ENCOUNTER — Inpatient Hospital Stay

## 2024-03-22 ENCOUNTER — Inpatient Hospital Stay: Admitting: Nurse Practitioner

## 2024-03-22 ENCOUNTER — Encounter: Payer: Self-pay | Admitting: Nurse Practitioner

## 2024-03-22 VITALS — BP 105/62 | HR 73 | Temp 97.9°F | Resp 18 | Ht 65.0 in | Wt 183.3 lb

## 2024-03-22 VITALS — BP 177/58 | HR 63 | Temp 97.2°F | Resp 18

## 2024-03-22 DIAGNOSIS — C184 Malignant neoplasm of transverse colon: Secondary | ICD-10-CM | POA: Diagnosis not present

## 2024-03-22 DIAGNOSIS — Z860100 Personal history of colon polyps, unspecified: Secondary | ICD-10-CM | POA: Diagnosis not present

## 2024-03-22 DIAGNOSIS — Z86711 Personal history of pulmonary embolism: Secondary | ICD-10-CM

## 2024-03-22 DIAGNOSIS — Z8619 Personal history of other infectious and parasitic diseases: Secondary | ICD-10-CM

## 2024-03-22 DIAGNOSIS — J45909 Unspecified asthma, uncomplicated: Secondary | ICD-10-CM

## 2024-03-22 DIAGNOSIS — Z7901 Long term (current) use of anticoagulants: Secondary | ICD-10-CM

## 2024-03-22 DIAGNOSIS — E279 Disorder of adrenal gland, unspecified: Secondary | ICD-10-CM

## 2024-03-22 DIAGNOSIS — N289 Disorder of kidney and ureter, unspecified: Secondary | ICD-10-CM

## 2024-03-22 DIAGNOSIS — E785 Hyperlipidemia, unspecified: Secondary | ICD-10-CM

## 2024-03-22 DIAGNOSIS — I10 Essential (primary) hypertension: Secondary | ICD-10-CM

## 2024-03-22 DIAGNOSIS — Z86718 Personal history of other venous thrombosis and embolism: Secondary | ICD-10-CM

## 2024-03-22 DIAGNOSIS — Z800 Family history of malignant neoplasm of digestive organs: Secondary | ICD-10-CM

## 2024-03-22 DIAGNOSIS — Z5111 Encounter for antineoplastic chemotherapy: Secondary | ICD-10-CM | POA: Diagnosis not present

## 2024-03-22 LAB — CMP (CANCER CENTER ONLY)
ALT: 21 U/L (ref 0–44)
AST: 22 U/L (ref 15–41)
Albumin: 4.5 g/dL (ref 3.5–5.0)
Alkaline Phosphatase: 90 U/L (ref 38–126)
Anion gap: 13 (ref 5–15)
BUN: 21 mg/dL (ref 8–23)
CO2: 23 mmol/L (ref 22–32)
Calcium: 10.3 mg/dL (ref 8.9–10.3)
Chloride: 108 mmol/L (ref 98–111)
Creatinine: 1.65 mg/dL — ABNORMAL HIGH (ref 0.44–1.00)
GFR, Estimated: 31 mL/min — ABNORMAL LOW (ref >60)
Glucose, Bld: 133 mg/dL — ABNORMAL HIGH (ref 70–99)
Potassium: 4.4 mmol/L (ref 3.5–5.1)
Sodium: 144 mmol/L (ref 135–145)
Total Bilirubin: 0.3 mg/dL (ref 0.0–1.2)
Total Protein: 7.3 g/dL (ref 6.5–8.1)

## 2024-03-22 LAB — CBC WITH DIFFERENTIAL (CANCER CENTER ONLY)
Abs Immature Granulocytes: 0.01 K/uL (ref 0.00–0.07)
Basophils Absolute: 0 K/uL (ref 0.0–0.1)
Basophils Relative: 1 %
Eosinophils Absolute: 0.2 K/uL (ref 0.0–0.5)
Eosinophils Relative: 5 %
HCT: 34.9 % — ABNORMAL LOW (ref 36.0–46.0)
Hemoglobin: 11.5 g/dL — ABNORMAL LOW (ref 12.0–15.0)
Immature Granulocytes: 0 %
Lymphocytes Relative: 35 %
Lymphs Abs: 1.5 K/uL (ref 0.7–4.0)
MCH: 31.4 pg (ref 26.0–34.0)
MCHC: 33 g/dL (ref 30.0–36.0)
MCV: 95.4 fL (ref 80.0–100.0)
Monocytes Absolute: 0.4 K/uL (ref 0.1–1.0)
Monocytes Relative: 10 %
Neutro Abs: 2.2 K/uL (ref 1.7–7.7)
Neutrophils Relative %: 49 %
Platelet Count: 188 K/uL (ref 150–400)
RBC: 3.66 MIL/uL — ABNORMAL LOW (ref 3.87–5.11)
RDW: 18.2 % — ABNORMAL HIGH (ref 11.5–15.5)
WBC Count: 4.4 K/uL (ref 4.0–10.5)
nRBC: 0 % (ref 0.0–0.2)

## 2024-03-22 MED ORDER — SODIUM CHLORIDE 0.9 % IV SOLN
200.0000 mg/m2 | Freq: Once | INTRAVENOUS | Status: DC
  Filled 2024-03-22: qty 18.5

## 2024-03-22 MED ORDER — FLUOROURACIL CHEMO INJECTION 500 MG/10ML
200.0000 mg/m2 | Freq: Once | INTRAVENOUS | Status: AC
  Administered 2024-03-22: 350 mg via INTRAVENOUS
  Filled 2024-03-22: qty 7

## 2024-03-22 MED ORDER — PROCHLORPERAZINE MALEATE 10 MG PO TABS
5.0000 mg | ORAL_TABLET | Freq: Once | ORAL | Status: AC
  Administered 2024-03-22: 5 mg via ORAL
  Filled 2024-03-22: qty 1

## 2024-03-22 MED ORDER — SODIUM CHLORIDE 0.9 % IV SOLN: INTRAVENOUS | Status: DC

## 2024-03-22 MED ORDER — SODIUM CHLORIDE 0.9 % IV SOLN
200.0000 mg/m2 | Freq: Once | INTRAVENOUS | Status: AC
  Administered 2024-03-22: 370 mg via INTRAVENOUS
  Filled 2024-03-22: qty 18.5

## 2024-03-22 NOTE — Progress Notes (Signed)
 Patient seen by Olam Ned NP today  Vitals are within treatment parameters:Yes   Labs are within treatment parameters: YesCreatinine 1.65   Treatment plan has been signed: Yes   Per physician team, Patient is ready for treatment and there are NO modifications to the treatment plan.

## 2024-03-22 NOTE — Progress Notes (Signed)
 Leucovorin  infusion was paused at the one hour mark, line flushed with 50cc NS before and after the fluorouracil  IV push.

## 2024-03-22 NOTE — Patient Instructions (Signed)

## 2024-03-22 NOTE — Progress Notes (Signed)
 Ok to treat w/ today's SCr per Olam Ned, NP  Wilma Dollar, Pharm.D., CPP 03/22/2024@1 :08 PM

## 2024-03-22 NOTE — Progress Notes (Signed)
  [facility] OFFICE PROGRESS NOTE   Diagnosis: Colon cancer  INTERVAL HISTORY:   Adrienne Rogers returns as scheduled.  She began cycle two 5-FU/leucovorin  per [facility] regimen on 03/15/2024.  She denies nausea/vomiting.  No mouth sores.  No diarrhea.  No hand or foot pain or redness.  No significant dizziness.  Fatigue is her main complaint.  Objective:  Vital signs in last 24 hours:  Blood pressure 105/62, pulse 73, temperature 97.9 F (36.6 C), temperature source Temporal, resp. rate 18, height 5' 5 (1.651 m), weight 183 lb 4.8 oz (83.1 kg), SpO2 98%.    HEENT: No thrush or ulcers. Resp: Lungs clear bilaterally. Cardio: Regular rate and rhythm. GI: No hepatosplenomegaly.  Nontender. Vascular: No leg edema. Skin: Palms without erythema. Port-A-Cath without erythema.  Lab Results:  Lab Results  Component Value Date   WBC 4.4 03/22/2024   HGB 11.5 (L) 03/22/2024   HCT 34.9 (L) 03/22/2024   MCV 95.4 03/22/2024   PLT 188 03/22/2024   NEUTROABS 2.2 03/22/2024    Imaging:  No results found.  Medications: I have reviewed the patient's current medications.  Assessment/Plan: Transverse colon cancer, stage IIIb (pT3,pN1b,cM0), status post a segmental transverse colectomy 08/03/2023 CT abdomen/pelvis 07/22/2023: Proximal colonic distention with transition point in the transverse colon, 4 cm lesion of the distal transverse colon with evidence of transmural extension/serosal component, small lymph nodes in the transverse mesocolon, indeterminate 1.9 cm left adrenal nodule Colonoscopy 07/22/2023: Mass in the ascending colon biopsy-invasive moderately differentiated adenocarcinoma CT chest 08/02/2023: No evidence of metastatic disease, indeterminate left adrenal nodule Segmental transverse colectomy and colocolonic anastomosis 08/03/2023-moderately differentiated adenocarcinoma of the transverse colon, tumor extends into pericolonic adipose tissue, lymphovascular  invasion present, 2/13 nodes Mismatch repair protein expression intact, MSI stable Elevated preoperative CEA 08/02/2023 (42.7) 08/31/2023 CEA normal (4.56) Cycle 1 capecitabine  09/08/2023 Cycle 2 capecitabine  09/29/2023 Cycle 3 capecitabine  10/20/2023, capecitabine  dose reduced to 1000 mg twice daily Cycle 4 capecitabine  11/18/2023, capecitabine  dose reduced to 1000 mg twice daily Cycle 5 capecitabine  12/16/2023, capecitabine  dose reduced to 1000 mg twice daily, discontinued day 8 due to recurrent dizziness Cycle 1 Week one 5-FU/leucovorin  per the [place] regimen 01/12/2024 Week two 5-FU/leucovorin  01/19/2024 Week three 5-FU/leucovorin , 5-FU dose reduced for neutropenia 01/26/2024 Treatment held 02/02/2024 and 02/09/2024 due to neutropenia Week four 5-FU/leucovorin  02/16/2024 Week five 5-FU /leucovorin  02/23/2024 Week six 5-FU/leucovorin  03/01/2024 Cycle 2 week one 5-FU/leucovorin  per [place] regimen 03/15/24 Week two 5-FU/leucovorin  03/22/2024   Indeterminate left adrenal nodule on CT abdomen/pelvis 07/22/2023 History of colonic polyps Hypertension Hyperlipidemia Asthma History of H. 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 appears stable.  She began cycle two 5-FU/leucovorin  per [facility] regimen last week.  She is tolerating treatment well.  Plan to proceed with cycle 2-week 2 today as scheduled.  CBC and chemistry panel reviewed.  Labs adequate for treatment.    She will return for follow-up in week 3 in 1 week.  We are available to see her sooner if needed.    Olam Ned ANP/GNP-BC   03/22/2024  12:30 PM

## 2024-03-22 NOTE — Patient Instructions (Signed)
 [facility] - A DEPT OF [facility]  Discharge Instructions: Thank you for choosing [facility] to provide your oncology and hematology care.   If you have a lab appointment with the Cancer Center, please go directly to the Cancer Center and check in at the registration area.   Wear comfortable clothing and clothing appropriate for easy access to any Portacath or PICC line.   We strive to give you quality time with your provider. You may need to reschedule your appointment if you arrive late (15 or more minutes).  Arriving late affects you and other patients whose appointments are after yours.  Also, if you miss three or more appointments without notifying the office, you may be dismissed from the clinic at the provider's discretion.      For prescription refill requests, have your pharmacy contact our office and allow 72 hours for refills to be completed.    Today you received the following chemotherapy and/or immunotherapy agents: leucovorin , fluorouracil        To help prevent nausea and vomiting after your treatment, we encourage you to take your nausea medication as directed.  BELOW ARE SYMPTOMS THAT SHOULD BE REPORTED IMMEDIATELY: *FEVER GREATER THAN 100.4 F (38 C) OR HIGHER *CHILLS OR SWEATING *NAUSEA AND VOMITING THAT IS NOT CONTROLLED WITH YOUR NAUSEA MEDICATION *UNUSUAL SHORTNESS OF BREATH *UNUSUAL BRUISING OR BLEEDING *URINARY PROBLEMS (pain or burning when urinating, or frequent urination) *BOWEL PROBLEMS (unusual diarrhea, constipation, pain near the anus) TENDERNESS IN MOUTH AND THROAT WITH OR WITHOUT PRESENCE OF ULCERS (sore throat, sores in mouth, or a toothache) UNUSUAL RASH, SWELLING OR PAIN  UNUSUAL VAGINAL DISCHARGE OR ITCHING   Items with * indicate a potential emergency and should be followed up as soon as possible or go to the Emergency Department if any problems should occur.  Please show the CHEMOTHERAPY ALERT CARD  or IMMUNOTHERAPY ALERT CARD at check-in to the Emergency Department and triage nurse.  Should you have questions after your visit or need to cancel or reschedule your appointment, please contact [facility] - A DEPT OF [facility]  Dept: [phone]  and follow the prompts.  Office hours are 8:00 a.m. to 4:30 p.m. Monday - Friday. Please note that voicemails left after 4:00 p.m. may not be returned until the following business day.  We are closed weekends and major holidays. You have access to a nurse at all times for urgent questions. Please call the main number to the clinic Dept: [phone] and follow the prompts.   For any non-urgent questions, you may also contact your provider using MyChart. We now offer e-Visits for anyone 42 and older to request care online for non-urgent symptoms. For details visit [email or web address].   Also download the MyChart app! Go to the app store, search MyChart, open the app, select [place], and log in with your MyChart username and password.

## 2024-03-29 ENCOUNTER — Inpatient Hospital Stay (HOSPITAL_BASED_OUTPATIENT_CLINIC_OR_DEPARTMENT_OTHER): Admitting: Oncology

## 2024-03-29 ENCOUNTER — Inpatient Hospital Stay

## 2024-03-29 ENCOUNTER — Other Ambulatory Visit

## 2024-03-29 ENCOUNTER — Ambulatory Visit

## 2024-03-29 ENCOUNTER — Ambulatory Visit: Admitting: Nurse Practitioner

## 2024-03-29 ENCOUNTER — Other Ambulatory Visit: Payer: Self-pay

## 2024-03-29 VITALS — BP 169/61 | HR 65 | Temp 97.9°F | Resp 18

## 2024-03-29 VITALS — BP 150/69 | HR 75 | Temp 97.8°F | Resp 18 | Ht 65.0 in | Wt 183.1 lb

## 2024-03-29 DIAGNOSIS — C184 Malignant neoplasm of transverse colon: Secondary | ICD-10-CM

## 2024-03-29 DIAGNOSIS — Z23 Encounter for immunization: Secondary | ICD-10-CM

## 2024-03-29 DIAGNOSIS — Z5111 Encounter for antineoplastic chemotherapy: Secondary | ICD-10-CM | POA: Diagnosis not present

## 2024-03-29 LAB — CBC WITH DIFFERENTIAL (CANCER CENTER ONLY)
Abs Immature Granulocytes: 0.01 K/uL (ref 0.00–0.07)
Basophils Absolute: 0 K/uL (ref 0.0–0.1)
Basophils Relative: 1 %
Eosinophils Absolute: 0.2 K/uL (ref 0.0–0.5)
Eosinophils Relative: 3 %
HCT: 34.5 % — ABNORMAL LOW (ref 36.0–46.0)
Hemoglobin: 11.2 g/dL — ABNORMAL LOW (ref 12.0–15.0)
Immature Granulocytes: 0 %
Lymphocytes Relative: 37 %
Lymphs Abs: 1.8 K/uL (ref 0.7–4.0)
MCH: 31.3 pg (ref 26.0–34.0)
MCHC: 32.5 g/dL (ref 30.0–36.0)
MCV: 96.4 fL (ref 80.0–100.0)
Monocytes Absolute: 0.5 K/uL (ref 0.1–1.0)
Monocytes Relative: 10 %
Neutro Abs: 2.3 K/uL (ref 1.7–7.7)
Neutrophils Relative %: 49 %
Platelet Count: 179 K/uL (ref 150–400)
RBC: 3.58 MIL/uL — ABNORMAL LOW (ref 3.87–5.11)
RDW: 18.2 % — ABNORMAL HIGH (ref 11.5–15.5)
WBC Count: 4.8 K/uL (ref 4.0–10.5)
nRBC: 0 % (ref 0.0–0.2)

## 2024-03-29 LAB — CMP (CANCER CENTER ONLY)
ALT: 19 U/L (ref 0–44)
AST: 19 U/L (ref 15–41)
Albumin: 4.3 g/dL (ref 3.5–5.0)
Alkaline Phosphatase: 86 U/L (ref 38–126)
Anion gap: 13 (ref 5–15)
BUN: 27 mg/dL — ABNORMAL HIGH (ref 8–23)
CO2: 22 mmol/L (ref 22–32)
Calcium: 10.3 mg/dL (ref 8.9–10.3)
Chloride: 105 mmol/L (ref 98–111)
Creatinine: 1.54 mg/dL — ABNORMAL HIGH (ref 0.44–1.00)
GFR, Estimated: 34 mL/min — ABNORMAL LOW (ref >60)
Glucose, Bld: 119 mg/dL — ABNORMAL HIGH (ref 70–99)
Potassium: 4.2 mmol/L (ref 3.5–5.1)
Sodium: 140 mmol/L (ref 135–145)
Total Bilirubin: 0.3 mg/dL (ref 0.0–1.2)
Total Protein: 7.2 g/dL (ref 6.5–8.1)

## 2024-03-29 MED ORDER — SODIUM CHLORIDE 0.9 % IV SOLN: INTRAVENOUS | Status: DC

## 2024-03-29 MED ORDER — INFLUENZA VAC SPLIT HIGH-DOSE 0.5 ML IM SUSY
0.5000 mL | PREFILLED_SYRINGE | Freq: Once | INTRAMUSCULAR | Status: AC
  Administered 2024-03-29: 0.5 mL via INTRAMUSCULAR
  Filled 2024-03-29: qty 0.5

## 2024-03-29 MED ORDER — PROCHLORPERAZINE MALEATE 10 MG PO TABS
5.0000 mg | ORAL_TABLET | Freq: Once | ORAL | Status: AC
  Administered 2024-03-29: 5 mg via ORAL
  Filled 2024-03-29: qty 1

## 2024-03-29 MED ORDER — SODIUM CHLORIDE 0.9 % IV SOLN
200.0000 mg/m2 | Freq: Once | INTRAVENOUS | Status: AC
  Administered 2024-03-29: 370 mg via INTRAVENOUS
  Filled 2024-03-29: qty 18.5

## 2024-03-29 MED ORDER — FLUOROURACIL CHEMO INJECTION 500 MG/10ML
200.0000 mg/m2 | Freq: Once | INTRAVENOUS | Status: AC
  Administered 2024-03-29: 350 mg via INTRAVENOUS
  Filled 2024-03-29: qty 7

## 2024-03-29 NOTE — Progress Notes (Signed)
 [facility] OFFICE PROGRESS NOTE   Diagnosis: Colon cancer  INTERVAL HISTORY:   Adrienne Rogers completed another cycle of 5-FU/leucovorin  on 03/22/2024.  No mouth sores, nausea, or consistent diarrhea.  She has noted darkening of the hands.  She has exertional dyspnea.  Objective:  Vital signs in last 24 hours:  Blood pressure (!) 150/69, pulse 75, temperature 97.8 F (36.6 C), temperature source Temporal, resp. rate 18, height 5' 5 (1.651 m), weight 183 lb 1.6 oz (83.1 kg), SpO2 98%.    HEENT: No thrush or ulcers Resp: Breath sounds, no respiratory distress Cardio: Regular rate and rhythm GI: No hepatosplenomegaly, nontender, no mass Vascular: No leg edema  Skin: Mild hyperpigmentation of the hands  Portacath/PICC-without erythema  Lab Results:  Lab Results  Component Value Date   WBC 4.8 03/29/2024   HGB 11.2 (L) 03/29/2024   HCT 34.5 (L) 03/29/2024   MCV 96.4 03/29/2024   PLT 179 03/29/2024   NEUTROABS 2.3 03/29/2024    CMP  Lab Results  Component Value Date   NA 140 03/29/2024   K 4.2 03/29/2024   CL 105 03/29/2024   CO2 22 03/29/2024   GLUCOSE 119 (H) 03/29/2024   BUN 27 (H) 03/29/2024   CREATININE 1.54 (H) 03/29/2024   CALCIUM  10.3 03/29/2024   PROT 7.2 03/29/2024   ALBUMIN 4.3 03/29/2024   AST 19 03/29/2024   ALT 19 03/29/2024   ALKPHOS 86 03/29/2024   BILITOT 0.3 03/29/2024   GFRNONAA 34 (L) 03/29/2024   GFRAA 45 (L) 03/09/2018    Lab Results  Component Value Date   CEA1 42.7 (H) 08/02/2023   CEA 3.78 12/07/2023     Medications: I have reviewed the patient's current medications.   Assessment/Plan: Transverse colon cancer, stage IIIb (pT3,pN1b,cM0), status post a segmental transverse colectomy 08/03/2023 CT abdomen/pelvis 07/22/2023: Proximal colonic distention with transition point in the transverse colon, 4 cm lesion of the distal transverse colon with evidence of transmural extension/serosal component, small lymph nodes in  the transverse mesocolon, indeterminate 1.9 cm left adrenal nodule Colonoscopy 07/22/2023: Mass in the ascending colon biopsy-invasive moderately differentiated adenocarcinoma CT chest 08/02/2023: No evidence of metastatic disease, indeterminate left adrenal nodule Segmental transverse colectomy and colocolonic anastomosis 08/03/2023-moderately differentiated adenocarcinoma of the transverse colon, tumor extends into pericolonic adipose tissue, lymphovascular invasion present, 2/13 nodes Mismatch repair protein expression intact, MSI stable Elevated preoperative CEA 08/02/2023 (42.7) 08/31/2023 CEA normal (4.56) Cycle 1 capecitabine  09/08/2023 Cycle 2 capecitabine  09/29/2023 Cycle 3 capecitabine  10/20/2023, capecitabine  dose reduced to 1000 mg twice daily Cycle 4 capecitabine  11/18/2023, capecitabine  dose reduced to 1000 mg twice daily Cycle 5 capecitabine  12/16/2023, capecitabine  dose reduced to 1000 mg twice daily, discontinued day 8 due to recurrent dizziness Cycle 1 Week one 5-FU/leucovorin  per the [place] regimen 01/12/2024 Week two 5-FU/leucovorin  01/19/2024 Week three 5-FU/leucovorin , 5-FU dose reduced for neutropenia 01/26/2024 Treatment held 02/02/2024 and 02/09/2024 due to neutropenia Week four 5-FU/leucovorin  02/16/2024 Week five 5-FU /leucovorin  02/23/2024 Week six 5-FU/leucovorin  03/01/2024 Cycle 2 week 1 5-FU/leucovorin  per [place] regimen 03/15/24 Week 2 5-FU/leucovorin  03/22/2024 Week 3 5-FU/leucovorin  03/29/2024   Indeterminate left adrenal nodule on CT abdomen/pelvis 07/22/2023 History of colonic polyps Hypertension Hyperlipidemia Asthma History of H. 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  She will complete another cycle of 5-FU/leucovorin  today.  She is tolerating the treatment well.  She will complete a total of 6 weeks of 5-FU/leucovorin  to complete a second course of weekly 5-FU/leucovorin . Adrienne Rogers received an influenza vaccine today. Arley Hof, MD  03/29/2024  10:50 AM

## 2024-03-29 NOTE — Patient Instructions (Signed)
 [facility] - A DEPT OF [facility]  Discharge Instructions: Thank you for choosing [facility] to provide your oncology and hematology care.   If you have a lab appointment with the Cancer Center, please go directly to the Cancer Center and check in at the registration area.   Wear comfortable clothing and clothing appropriate for easy access to any Portacath or PICC line.   We strive to give you quality time with your provider. You may need to reschedule your appointment if you arrive late (15 or more minutes).  Arriving late affects you and other patients whose appointments are after yours.  Also, if you miss three or more appointments without notifying the office, you may be dismissed from the clinic at the provider's discretion.      For prescription refill requests, have your pharmacy contact our office and allow 72 hours for refills to be completed.    Today you received the following chemotherapy and/or immunotherapy agents: leucovorin , fluorouracil        To help prevent nausea and vomiting after your treatment, we encourage you to take your nausea medication as directed.  BELOW ARE SYMPTOMS THAT SHOULD BE REPORTED IMMEDIATELY: *FEVER GREATER THAN 100.4 F (38 C) OR HIGHER *CHILLS OR SWEATING *NAUSEA AND VOMITING THAT IS NOT CONTROLLED WITH YOUR NAUSEA MEDICATION *UNUSUAL SHORTNESS OF BREATH *UNUSUAL BRUISING OR BLEEDING *URINARY PROBLEMS (pain or burning when urinating, or frequent urination) *BOWEL PROBLEMS (unusual diarrhea, constipation, pain near the anus) TENDERNESS IN MOUTH AND THROAT WITH OR WITHOUT PRESENCE OF ULCERS (sore throat, sores in mouth, or a toothache) UNUSUAL RASH, SWELLING OR PAIN  UNUSUAL VAGINAL DISCHARGE OR ITCHING   Items with * indicate a potential emergency and should be followed up as soon as possible or go to the Emergency Department if any problems should occur.  Please show the CHEMOTHERAPY ALERT CARD  or IMMUNOTHERAPY ALERT CARD at check-in to the Emergency Department and triage nurse.  Should you have questions after your visit or need to cancel or reschedule your appointment, please contact [facility] - A DEPT OF [facility]  Dept: [phone]  and follow the prompts.  Office hours are 8:00 a.m. to 4:30 p.m. Monday - Friday. Please note that voicemails left after 4:00 p.m. may not be returned until the following business day.  We are closed weekends and major holidays. You have access to a nurse at all times for urgent questions. Please call the main number to the clinic Dept: [phone] and follow the prompts.   For any non-urgent questions, you may also contact your provider using MyChart. We now offer e-Visits for anyone 42 and older to request care online for non-urgent symptoms. For details visit [email or web address].   Also download the MyChart app! Go to the app store, search MyChart, open the app, select [place], and log in with your MyChart username and password.

## 2024-03-29 NOTE — Progress Notes (Signed)
 Patient seen by Dr. Arley Hof today  Vitals are within treatment parameters:Yes   Labs are within treatment parameters: No (Please specify and give further instructions.)  Creatinine 1.54--OK to proceed   Treatment plan has been signed: Yes   Per physician team, Patient is ready for treatment and there are NO modifications to the treatment plan. Requesting flu vaccine today.

## 2024-03-29 NOTE — Patient Instructions (Signed)

## 2024-04-05 ENCOUNTER — Inpatient Hospital Stay

## 2024-04-05 ENCOUNTER — Inpatient Hospital Stay: Attending: Oncology

## 2024-04-05 ENCOUNTER — Encounter: Payer: Self-pay | Admitting: Nurse Practitioner

## 2024-04-05 ENCOUNTER — Inpatient Hospital Stay (HOSPITAL_BASED_OUTPATIENT_CLINIC_OR_DEPARTMENT_OTHER): Admitting: Nurse Practitioner

## 2024-04-05 VITALS — BP 125/77 | HR 66 | Temp 98.5°F | Resp 18 | Ht 65.0 in | Wt 181.4 lb

## 2024-04-05 VITALS — BP 137/61 | HR 65 | Temp 97.9°F | Resp 18

## 2024-04-05 DIAGNOSIS — Z7901 Long term (current) use of anticoagulants: Secondary | ICD-10-CM

## 2024-04-05 DIAGNOSIS — E278 Other specified disorders of adrenal gland: Secondary | ICD-10-CM | POA: Insufficient documentation

## 2024-04-05 DIAGNOSIS — Z86718 Personal history of other venous thrombosis and embolism: Secondary | ICD-10-CM | POA: Diagnosis not present

## 2024-04-05 DIAGNOSIS — Z86711 Personal history of pulmonary embolism: Secondary | ICD-10-CM | POA: Insufficient documentation

## 2024-04-05 DIAGNOSIS — Z860100 Personal history of colon polyps, unspecified: Secondary | ICD-10-CM

## 2024-04-05 DIAGNOSIS — Z800 Family history of malignant neoplasm of digestive organs: Secondary | ICD-10-CM | POA: Diagnosis not present

## 2024-04-05 DIAGNOSIS — D709 Neutropenia, unspecified: Secondary | ICD-10-CM | POA: Insufficient documentation

## 2024-04-05 DIAGNOSIS — C184 Malignant neoplasm of transverse colon: Secondary | ICD-10-CM

## 2024-04-05 DIAGNOSIS — N289 Disorder of kidney and ureter, unspecified: Secondary | ICD-10-CM | POA: Diagnosis not present

## 2024-04-05 DIAGNOSIS — E279 Disorder of adrenal gland, unspecified: Secondary | ICD-10-CM

## 2024-04-05 DIAGNOSIS — Z8619 Personal history of other infectious and parasitic diseases: Secondary | ICD-10-CM | POA: Diagnosis not present

## 2024-04-05 DIAGNOSIS — Z5111 Encounter for antineoplastic chemotherapy: Secondary | ICD-10-CM | POA: Diagnosis present

## 2024-04-05 DIAGNOSIS — I10 Essential (primary) hypertension: Secondary | ICD-10-CM

## 2024-04-05 DIAGNOSIS — J45909 Unspecified asthma, uncomplicated: Secondary | ICD-10-CM

## 2024-04-05 DIAGNOSIS — E785 Hyperlipidemia, unspecified: Secondary | ICD-10-CM

## 2024-04-05 LAB — CMP (CANCER CENTER ONLY)
ALT: 18 U/L (ref 0–44)
AST: 19 U/L (ref 15–41)
Albumin: 4.4 g/dL (ref 3.5–5.0)
Alkaline Phosphatase: 88 U/L (ref 38–126)
Anion gap: 12 (ref 5–15)
BUN: 23 mg/dL (ref 8–23)
CO2: 24 mmol/L (ref 22–32)
Calcium: 10.1 mg/dL (ref 8.9–10.3)
Chloride: 104 mmol/L (ref 98–111)
Creatinine: 1.52 mg/dL — ABNORMAL HIGH (ref 0.44–1.00)
GFR, Estimated: 34 mL/min — ABNORMAL LOW (ref >60)
Glucose, Bld: 134 mg/dL — ABNORMAL HIGH (ref 70–99)
Potassium: 4.1 mmol/L (ref 3.5–5.1)
Sodium: 140 mmol/L (ref 135–145)
Total Bilirubin: 0.3 mg/dL (ref 0.0–1.2)
Total Protein: 7.2 g/dL (ref 6.5–8.1)

## 2024-04-05 LAB — CBC WITH DIFFERENTIAL (CANCER CENTER ONLY)
Abs Immature Granulocytes: 0.01 K/uL (ref 0.00–0.07)
Basophils Absolute: 0 K/uL (ref 0.0–0.1)
Basophils Relative: 1 %
Eosinophils Absolute: 0.1 K/uL (ref 0.0–0.5)
Eosinophils Relative: 3 %
HCT: 34.6 % — ABNORMAL LOW (ref 36.0–46.0)
Hemoglobin: 11.5 g/dL — ABNORMAL LOW (ref 12.0–15.0)
Immature Granulocytes: 0 %
Lymphocytes Relative: 37 %
Lymphs Abs: 1.4 K/uL (ref 0.7–4.0)
MCH: 32 pg (ref 26.0–34.0)
MCHC: 33.2 g/dL (ref 30.0–36.0)
MCV: 96.4 fL (ref 80.0–100.0)
Monocytes Absolute: 0.3 K/uL (ref 0.1–1.0)
Monocytes Relative: 9 %
Neutro Abs: 1.9 K/uL (ref 1.7–7.7)
Neutrophils Relative %: 50 %
Platelet Count: 184 K/uL (ref 150–400)
RBC: 3.59 MIL/uL — ABNORMAL LOW (ref 3.87–5.11)
RDW: 18.2 % — ABNORMAL HIGH (ref 11.5–15.5)
WBC Count: 3.8 K/uL — ABNORMAL LOW (ref 4.0–10.5)
nRBC: 0 % (ref 0.0–0.2)

## 2024-04-05 LAB — CEA (ACCESS): CEA (CHCC): 4.39 ng/mL (ref 0.00–5.00)

## 2024-04-05 MED ORDER — FLUOROURACIL CHEMO INJECTION 500 MG/10ML
200.0000 mg/m2 | Freq: Once | INTRAVENOUS | Status: AC
  Administered 2024-04-05: 350 mg via INTRAVENOUS
  Filled 2024-04-05: qty 7

## 2024-04-05 MED ORDER — SODIUM CHLORIDE 0.9 % IV SOLN: INTRAVENOUS | Status: DC

## 2024-04-05 MED ORDER — PROCHLORPERAZINE MALEATE 10 MG PO TABS
5.0000 mg | ORAL_TABLET | Freq: Once | ORAL | Status: AC
  Administered 2024-04-05: 5 mg via ORAL
  Filled 2024-04-05: qty 1

## 2024-04-05 MED ORDER — SODIUM CHLORIDE 0.9 % IV SOLN
200.0000 mg/m2 | Freq: Once | INTRAVENOUS | Status: AC
  Administered 2024-04-05: 370 mg via INTRAVENOUS
  Filled 2024-04-05: qty 18.5

## 2024-04-05 NOTE — Progress Notes (Signed)
 [facility] OFFICE PROGRESS NOTE   Diagnosis: Colon cancer  INTERVAL HISTORY:   Adrienne Rogers returns as scheduled.  She completed another cycle of 5-FU/leucovorin  03/29/2024.  She denies nausea/vomiting.  No mouth sores.  No diarrhea.  No hand or foot pain or redness.  No bleeding.  No dizziness.  She has a good appetite.  No abdominal pain.  Objective:  Vital signs in last 24 hours:  Blood pressure 125/77, pulse 66, temperature 98.5 F (36.9 C), temperature source Oral, resp. rate 18, height 5' 5 (1.651 m), weight 181 lb 6.4 oz (82.3 kg), SpO2 99%.    HEENT: No thrush or ulcers. Resp: Lungs clear bilaterally. Cardio: Regular rate and rhythm. GI: No hepatosplenomegaly.  Nontender. Vascular: No leg edema. Skin: Palms without erythema. Port-A-Cath without erythema.  Lab Results:  Lab Results  Component Value Date   WBC 3.8 (L) 04/05/2024   HGB 11.5 (L) 04/05/2024   HCT 34.6 (L) 04/05/2024   MCV 96.4 04/05/2024   PLT 184 04/05/2024   NEUTROABS 1.9 04/05/2024    Imaging:  No results found.  Medications: I have reviewed the patient's current medications.  Assessment/Plan: Transverse colon cancer, stage IIIb (pT3,pN1b,cM0), status post a segmental transverse colectomy 08/03/2023 CT abdomen/pelvis 07/22/2023: Proximal colonic distention with transition point in the transverse colon, 4 cm lesion of the distal transverse colon with evidence of transmural extension/serosal component, small lymph nodes in the transverse mesocolon, indeterminate 1.9 cm left adrenal nodule Colonoscopy 07/22/2023: Mass in the ascending colon biopsy-invasive moderately differentiated adenocarcinoma CT chest 08/02/2023: No evidence of metastatic disease, indeterminate left adrenal nodule Segmental transverse colectomy and colocolonic anastomosis 08/03/2023-moderately differentiated adenocarcinoma of the transverse colon, tumor extends into pericolonic adipose tissue, lymphovascular  invasion present, 2/13 nodes Mismatch repair protein expression intact, MSI stable Elevated preoperative CEA 08/02/2023 (42.7) 08/31/2023 CEA normal (4.56) Cycle 1 capecitabine  09/08/2023 Cycle 2 capecitabine  09/29/2023 Cycle 3 capecitabine  10/20/2023, capecitabine  dose reduced to 1000 mg twice daily Cycle 4 capecitabine  11/18/2023, capecitabine  dose reduced to 1000 mg twice daily Cycle 5 capecitabine  12/16/2023, capecitabine  dose reduced to 1000 mg twice daily, discontinued day 8 due to recurrent dizziness Cycle 1 Week one 5-FU/leucovorin  per the [place] regimen 01/12/2024 Week two 5-FU/leucovorin  01/19/2024 Week three 5-FU/leucovorin , 5-FU dose reduced for neutropenia 01/26/2024 Treatment held 02/02/2024 and 02/09/2024 due to neutropenia Week four 5-FU/leucovorin  02/16/2024 Week five 5-FU /leucovorin  02/23/2024 Week six 5-FU/leucovorin  03/01/2024 Cycle 2 week 1 5-FU/leucovorin  per [place] regimen 03/15/24 Week 2 5-FU/leucovorin  03/22/2024 Week 3 5-FU/leucovorin  03/29/2024 Week 4 5-FU/leucovorin  04/05/2024   Indeterminate left adrenal nodule on CT abdomen/pelvis 07/22/2023 History of colonic polyps Hypertension Hyperlipidemia Asthma History of H. 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  She is completing another cycle of 5-FU/leucovorin  per the [place] regimen.  She is tolerating treatment well.  Plan to proceed with week 4 today as scheduled.  CBC and chemistry panel reviewed.  Labs adequate for treatment.  She will return for follow-up and week 5 in 1 week.  We are available to see her sooner if needed.    Olam Ned ANP/GNP-BC   04/05/2024  9:53 AM

## 2024-04-05 NOTE — Patient Instructions (Signed)

## 2024-04-05 NOTE — Patient Instructions (Signed)
 [facility] - A DEPT OF [facility]  Discharge Instructions: Thank you for choosing [facility] to provide your oncology and hematology care.   If you have a lab appointment with the Cancer Center, please go directly to the Cancer Center and check in at the registration area.   Wear comfortable clothing and clothing appropriate for easy access to any Portacath or PICC line.   We strive to give you quality time with your provider. You may need to reschedule your appointment if you arrive late (15 or more minutes).  Arriving late affects you and other patients whose appointments are after yours.  Also, if you miss three or more appointments without notifying the office, you may be dismissed from the clinic at the provider's discretion.      For prescription refill requests, have your pharmacy contact our office and allow 72 hours for refills to be completed.    Today you received the following chemotherapy and/or immunotherapy agents: leucovorin , fluorouracil        To help prevent nausea and vomiting after your treatment, we encourage you to take your nausea medication as directed.  BELOW ARE SYMPTOMS THAT SHOULD BE REPORTED IMMEDIATELY: *FEVER GREATER THAN 100.4 F (38 C) OR HIGHER *CHILLS OR SWEATING *NAUSEA AND VOMITING THAT IS NOT CONTROLLED WITH YOUR NAUSEA MEDICATION *UNUSUAL SHORTNESS OF BREATH *UNUSUAL BRUISING OR BLEEDING *URINARY PROBLEMS (pain or burning when urinating, or frequent urination) *BOWEL PROBLEMS (unusual diarrhea, constipation, pain near the anus) TENDERNESS IN MOUTH AND THROAT WITH OR WITHOUT PRESENCE OF ULCERS (sore throat, sores in mouth, or a toothache) UNUSUAL RASH, SWELLING OR PAIN  UNUSUAL VAGINAL DISCHARGE OR ITCHING   Items with * indicate a potential emergency and should be followed up as soon as possible or go to the Emergency Department if any problems should occur.  Please show the CHEMOTHERAPY ALERT CARD  or IMMUNOTHERAPY ALERT CARD at check-in to the Emergency Department and triage nurse.  Should you have questions after your visit or need to cancel or reschedule your appointment, please contact [facility] - A DEPT OF [facility]  Dept: [phone]  and follow the prompts.  Office hours are 8:00 a.m. to 4:30 p.m. Monday - Friday. Please note that voicemails left after 4:00 p.m. may not be returned until the following business day.  We are closed weekends and major holidays. You have access to a nurse at all times for urgent questions. Please call the main number to the clinic Dept: [phone] and follow the prompts.   For any non-urgent questions, you may also contact your provider using MyChart. We now offer e-Visits for anyone 42 and older to request care online for non-urgent symptoms. For details visit [email or web address].   Also download the MyChart app! Go to the app store, search MyChart, open the app, select [place], and log in with your MyChart username and password.

## 2024-04-05 NOTE — Progress Notes (Signed)
 Patient seen by Olam Ned NP today  Vitals are within treatment parameters:Yes   Labs are within treatment parameters: Yes Creatinine 1.5 WBC 3.8  Treatment plan has been signed: Yes   Per physician team, Patient is ready for treatment and there are NO modifications to the treatment plan.

## 2024-04-06 ENCOUNTER — Other Ambulatory Visit: Payer: Self-pay

## 2024-04-11 NOTE — Progress Notes (Unsigned)
 [facility]   [phone] [phone]    Patient Care Team: Beverley Louann DASEN, MD as PCP - General (Internal Medicine) Kristie Lamprey, MD as Consulting Physician (Gastroenterology)   CHIEF COMPLAINT: Follow up colon cancer   CURRENT THERAPY: Adjuvant 5FU/leucovorin  per [facility] regimen  INTERVAL HISTORY [name] returns for follow up and treatment as scheduled. She continues weekly 5FU/leuc. Manages constipation with stool softener and occasional miralax . Has exertional dyspnea which is stable since having PE. Compliant with eliquis . Denies cough, chest pain.   ROS  All other systems reviewed and negative   Past Medical History:  Diagnosis Date   Asthma    COPD (chronic obstructive pulmonary disease) (HCC)    Depression    Hyperlipidemia    Hypertension    PTSD (post-traumatic stress disorder)      Past Surgical History:  Procedure Laterality Date   ABDOMINAL HYSTERECTOMY     1981   BACK SURGERY     ACDF 2005   BIOPSY  11/10/2021   Procedure: BIOPSY;  Surgeon: Rollin Dover, MD;  Location: [facility];  Service: Gastroenterology;;   CHOLECYSTECTOMY     ESOPHAGOGASTRODUODENOSCOPY N/A 11/10/2021   Procedure: ESOPHAGOGASTRODUODENOSCOPY (EGD);  Surgeon: Rollin Dover, MD;  Location: [facility];  Service: Gastroenterology;  Laterality: N/A;  IDA, Melena   IR IMAGING GUIDED PORT INSERTION  01/11/2024   PARTIAL COLECTOMY N/A 08/03/2023   Procedure: OPEN PARTIAL COLECTOMY;  Surgeon: Signe Mitzie LABOR, MD;  Location: [facility];  Service: General;  Laterality: N/A;   right knee arthroscopy     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   albuterol  (VENTOLIN  HFA) 108 (90 Base) MCG/ACT inhaler Inhale 2 puffs into the lungs every 6 (six) hours as needed for wheezing or shortness of breath.   APIXABAN  (ELIQUIS ) VTE STARTER PACK (10MG  AND 5MG ) Take as directed on package: start with two-5mg  tablets twice daily for 7 days. On day 8, switch to one-5mg  tablet twice daily.   carvedilol  (COREG ) 25 MG tablet Take 0.5 tablets (12.5 mg total) by mouth 2 (two) times daily.   Cholecalciferol  25 MCG (1000 UT) tablet Take 1,000 Units by mouth daily.   docusate sodium  (COLACE) 100 MG capsule Take 1 capsule (100 mg total) by mouth 2 (two) times daily as needed for mild constipation.   ferrous sulfate  325 (65 FE) MG tablet Take 325 mg by mouth daily with breakfast.   fluticasone  (FLONASE) 50 MCG/ACT nasal spray Place 2 sprays into the nose daily.   ketotifen  (ZADITOR ) 0.035 % ophthalmic solution Place 1 drop into both eyes in the morning and at bedtime.   lidocaine -prilocaine  (EMLA ) cream Apply 1 Application topically as needed.   methocarbamol  (ROBAXIN ) 500 MG tablet Take 1 tablet (500 mg total) by mouth every 6 (six) hours as needed for muscle spasms.   pantoprazole  (PROTONIX ) 40 MG tablet Take 40 mg by mouth 2 (two) times daily before a meal.   polyethylene glycol (MIRALAX  / GLYCOLAX ) 17 g packet Take 17 g by mouth 2 (two) times daily as needed.   sertraline  (ZOLOFT )  100 MG tablet Take 200 mg by mouth daily.   simvastatin  (ZOCOR ) 40 MG tablet Take 40 mg by mouth at bedtime.    spironolactone  (ALDACTONE ) 25 MG tablet Take 25 mg by mouth daily.   Tiotropium Bromide  Monohydrate 2.5 MCG/ACT AERS Take 2 puffs by mouth daily.   traZODone  (DESYREL ) 100 MG tablet Take 100 mg by mouth at bedtime.   WIXELA INHUB 250-50 MCG/ACT AEPB Inhale 1 puff into the lungs 2 (two) times daily.   Meclizine HCl 25 MG CHEW Chew 25 mg by mouth as needed. (Patient not taking: Reported on 04/12/2024)    No facility-administered encounter medications on file as of 04/12/2024.     Today's Vitals   04/12/24 1055  BP: 103/61  Pulse: 69  Resp: 18  Temp: 97.8 F (36.6 C)  TempSrc: Temporal  SpO2: 98%  Weight: 182 lb 8 oz (82.8 kg)  Height: 5' 5 (1.651 m)   Body mass index is 30.37 kg/m.   ECOG PERFORMANCE STATUS: 1 - Symptomatic but completely ambulatory  PHYSICAL EXAM GENERAL:alert, no distress and comfortable SKIN: no rash  EYES: sclera clear NECK: without mass LYMPH:  no palpable cervical or supraclavicular lymphadenopathy  LUNGS: clear with normal breathing effort HEART: regular rate & rhythm NEURO: alert & oriented x 3 with fluent speech PAC without erythema    CBC    Latest Ref Rng & Units 04/12/2024   10:48 AM 04/05/2024    9:26 AM 03/29/2024    9:59 AM  CBC  WBC 4.0 - 10.5 K/uL 3.5  3.8  4.8   Hemoglobin 12.0 - 15.0 g/dL 88.2  88.4  88.7   Hematocrit 36.0 - 46.0 % 35.0  34.6  34.5   Platelets 150 - 400 K/uL 180  184  179       CMP     Latest Ref Rng & Units 04/12/2024   10:48 AM 04/05/2024    9:26 AM 03/29/2024    9:59 AM  CMP  Glucose 70 - 99 mg/dL 877  865  880   BUN 8 - 23 mg/dL 22  23  27    Creatinine 0.44 - 1.00 mg/dL 8.52  8.47  8.45   Sodium 135 - 145 mmol/L 142  140  140   Potassium 3.5 - 5.1 mmol/L 4.0  4.1  4.2   Chloride 98 - 111 mmol/L 107  104  105   CO2 22 - 32 mmol/L 21  24  22    Calcium  8.9 - 10.3 mg/dL 89.7  89.8  89.6   Total Protein 6.5 - 8.1 g/dL 7.3  7.2  7.2   Total Bilirubin 0.0 - 1.2 mg/dL 0.4  0.3  0.3   Alkaline Phos 38 - 126 U/L 88  88  86   AST 15 - 41 U/L 21  19  19    ALT 0 - 44 U/L 17  18  19        ASSESSMENT & PLAN: Transverse colon cancer, stage IIIb (pT3,pN1b,cM0), status post a segmental transverse colectomy 08/03/2023 CT abdomen/pelvis 07/22/2023: Proximal colonic distention with transition point in the transverse colon, 4 cm lesion of the distal transverse colon with evidence of transmural extension/serosal  component, small lymph nodes in the transverse mesocolon, indeterminate 1.9 cm left adrenal nodule Colonoscopy 07/22/2023: Mass in the ascending colon biopsy-invasive moderately differentiated adenocarcinoma CT chest 08/02/2023: No evidence of metastatic disease, indeterminate left adrenal nodule Segmental transverse colectomy and colocolonic anastomosis 08/03/2023-moderately differentiated adenocarcinoma of the transverse colon, tumor extends  into pericolonic adipose tissue, lymphovascular invasion present, 2/13 nodes Mismatch repair protein expression intact, MSI stable Elevated preoperative CEA 08/02/2023 (42.7) 08/31/2023 CEA normal (4.56) Cycle 1 capecitabine  09/08/2023 Cycle 2 capecitabine  09/29/2023 Cycle 3 capecitabine  10/20/2023, capecitabine  dose reduced to 1000 mg twice daily Cycle 4 capecitabine  11/18/2023, capecitabine  dose reduced to 1000 mg twice daily Cycle 5 capecitabine  12/16/2023, capecitabine  dose reduced to 1000 mg twice daily, discontinued day 8 due to recurrent dizziness Cycle 1 Week one 5-FU/leucovorin  per the [place] regimen 01/12/2024 Week two 5-FU/leucovorin  01/19/2024 Week three 5-FU/leucovorin , 5-FU dose reduced for neutropenia 01/26/2024 Treatment held 02/02/2024 and 02/09/2024 due to neutropenia Week four 5-FU/leucovorin  02/16/2024 Week five 5-FU /leucovorin  02/23/2024 Week six 5-FU/leucovorin  03/01/2024 Cycle 2 week one 5-FU/leucovorin  per [place] regimen 03/15/24 Week two 5-FU/leuc 03/22/2024 Week three 5-FU/leuc 03/29/2024 Week four 5-FU/leuc 04/05/2024 Week five 5-FU/leuc 04/12/2024   Indeterminate left adrenal nodule on CT abdomen/pelvis 07/22/2023 History of colonic polyps Hypertension Hyperlipidemia Asthma History of H. 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 Duffy appears stable. She is s/p week 4 of a second 6-week cycle of 5FU/leuc per [facility]. Tolerating well overall. Able to recover and function with adequate PS. There is no clinical evidence of recurrence.   Labs reviewed. Proceed with week 5 of 5FU/leuc, no dose modifications. She will return next week for final week 6 then proceed with surveillance.    All questions were answered. The patient knows to call the clinic with any problems, questions or concerns. No barriers to learning were detected.   Makeyla Govan K Jakiah Bienaime, NP 04/12/2024

## 2024-04-12 ENCOUNTER — Inpatient Hospital Stay: Admitting: Nurse Practitioner

## 2024-04-12 ENCOUNTER — Other Ambulatory Visit

## 2024-04-12 ENCOUNTER — Inpatient Hospital Stay

## 2024-04-12 ENCOUNTER — Ambulatory Visit

## 2024-04-12 ENCOUNTER — Encounter: Payer: Self-pay | Admitting: Nurse Practitioner

## 2024-04-12 ENCOUNTER — Ambulatory Visit: Admitting: Nurse Practitioner

## 2024-04-12 VITALS — BP 103/61 | HR 69 | Temp 97.8°F | Resp 18 | Ht 65.0 in | Wt 182.5 lb

## 2024-04-12 VITALS — BP 176/86 | HR 56 | Temp 97.6°F | Resp 18

## 2024-04-12 DIAGNOSIS — C184 Malignant neoplasm of transverse colon: Secondary | ICD-10-CM | POA: Diagnosis not present

## 2024-04-12 DIAGNOSIS — Z9221 Personal history of antineoplastic chemotherapy: Secondary | ICD-10-CM

## 2024-04-12 DIAGNOSIS — Z86711 Personal history of pulmonary embolism: Secondary | ICD-10-CM

## 2024-04-12 DIAGNOSIS — Z86718 Personal history of other venous thrombosis and embolism: Secondary | ICD-10-CM

## 2024-04-12 DIAGNOSIS — Z5111 Encounter for antineoplastic chemotherapy: Secondary | ICD-10-CM | POA: Diagnosis not present

## 2024-04-12 DIAGNOSIS — Z800 Family history of malignant neoplasm of digestive organs: Secondary | ICD-10-CM

## 2024-04-12 DIAGNOSIS — E279 Disorder of adrenal gland, unspecified: Secondary | ICD-10-CM

## 2024-04-12 LAB — CBC WITH DIFFERENTIAL (CANCER CENTER ONLY)
Abs Immature Granulocytes: 0 K/uL (ref 0.00–0.07)
Basophils Absolute: 0 K/uL (ref 0.0–0.1)
Basophils Relative: 1 %
Eosinophils Absolute: 0.1 K/uL (ref 0.0–0.5)
Eosinophils Relative: 4 %
HCT: 35 % — ABNORMAL LOW (ref 36.0–46.0)
Hemoglobin: 11.7 g/dL — ABNORMAL LOW (ref 12.0–15.0)
Immature Granulocytes: 0 %
Lymphocytes Relative: 39 %
Lymphs Abs: 1.4 K/uL (ref 0.7–4.0)
MCH: 32 pg (ref 26.0–34.0)
MCHC: 33.4 g/dL (ref 30.0–36.0)
MCV: 95.6 fL (ref 80.0–100.0)
Monocytes Absolute: 0.3 K/uL (ref 0.1–1.0)
Monocytes Relative: 9 %
Neutro Abs: 1.6 K/uL — ABNORMAL LOW (ref 1.7–7.7)
Neutrophils Relative %: 47 %
Platelet Count: 180 K/uL (ref 150–400)
RBC: 3.66 MIL/uL — ABNORMAL LOW (ref 3.87–5.11)
RDW: 18 % — ABNORMAL HIGH (ref 11.5–15.5)
WBC Count: 3.5 K/uL — ABNORMAL LOW (ref 4.0–10.5)
nRBC: 0 % (ref 0.0–0.2)

## 2024-04-12 LAB — CMP (CANCER CENTER ONLY)
ALT: 17 U/L (ref 0–44)
AST: 21 U/L (ref 15–41)
Albumin: 4.5 g/dL (ref 3.5–5.0)
Alkaline Phosphatase: 88 U/L (ref 38–126)
Anion gap: 14 (ref 5–15)
BUN: 22 mg/dL (ref 8–23)
CO2: 21 mmol/L — ABNORMAL LOW (ref 22–32)
Calcium: 10.2 mg/dL (ref 8.9–10.3)
Chloride: 107 mmol/L (ref 98–111)
Creatinine: 1.47 mg/dL — ABNORMAL HIGH (ref 0.44–1.00)
GFR, Estimated: 35 mL/min — ABNORMAL LOW (ref >60)
Glucose, Bld: 122 mg/dL — ABNORMAL HIGH (ref 70–99)
Potassium: 4 mmol/L (ref 3.5–5.1)
Sodium: 142 mmol/L (ref 135–145)
Total Bilirubin: 0.4 mg/dL (ref 0.0–1.2)
Total Protein: 7.3 g/dL (ref 6.5–8.1)

## 2024-04-12 MED ORDER — SODIUM CHLORIDE 0.9 % IV SOLN: INTRAVENOUS | Status: DC

## 2024-04-12 MED ORDER — SODIUM CHLORIDE 0.9 % IV SOLN
200.0000 mg/m2 | Freq: Once | INTRAVENOUS | Status: DC
  Filled 2024-04-12: qty 18.5

## 2024-04-12 MED ORDER — FLUOROURACIL CHEMO INJECTION 500 MG/10ML
200.0000 mg/m2 | Freq: Once | INTRAVENOUS | Status: AC
  Administered 2024-04-12: 350 mg via INTRAVENOUS
  Filled 2024-04-12: qty 7

## 2024-04-12 MED ORDER — SODIUM CHLORIDE 0.9 % IV SOLN
200.0000 mg/m2 | Freq: Once | INTRAVENOUS | Status: AC
  Administered 2024-04-12: 370 mg via INTRAVENOUS
  Filled 2024-04-12: qty 18.5

## 2024-04-12 MED ORDER — PROCHLORPERAZINE MALEATE 10 MG PO TABS
5.0000 mg | ORAL_TABLET | Freq: Once | ORAL | Status: AC
  Administered 2024-04-12: 5 mg via ORAL
  Filled 2024-04-12: qty 1

## 2024-04-12 NOTE — Patient Instructions (Signed)
 [facility] - A DEPT OF [facility]  Discharge Instructions: Thank you for choosing [facility] to provide your oncology and hematology care.   If you have a lab appointment with the Cancer Center, please go directly to the Cancer Center and check in at the registration area.   Wear comfortable clothing and clothing appropriate for easy access to any Portacath or PICC line.   We strive to give you quality time with your provider. You may need to reschedule your appointment if you arrive late (15 or more minutes).  Arriving late affects you and other patients whose appointments are after yours.  Also, if you miss three or more appointments without notifying the office, you may be dismissed from the clinic at the provider's discretion.      For prescription refill requests, have your pharmacy contact our office and allow 72 hours for refills to be completed.    Today you received the following chemotherapy and/or immunotherapy agents: leucovorin , fluorouracil        To help prevent nausea and vomiting after your treatment, we encourage you to take your nausea medication as directed.  BELOW ARE SYMPTOMS THAT SHOULD BE REPORTED IMMEDIATELY: *FEVER GREATER THAN 100.4 F (38 C) OR HIGHER *CHILLS OR SWEATING *NAUSEA AND VOMITING THAT IS NOT CONTROLLED WITH YOUR NAUSEA MEDICATION *UNUSUAL SHORTNESS OF BREATH *UNUSUAL BRUISING OR BLEEDING *URINARY PROBLEMS (pain or burning when urinating, or frequent urination) *BOWEL PROBLEMS (unusual diarrhea, constipation, pain near the anus) TENDERNESS IN MOUTH AND THROAT WITH OR WITHOUT PRESENCE OF ULCERS (sore throat, sores in mouth, or a toothache) UNUSUAL RASH, SWELLING OR PAIN  UNUSUAL VAGINAL DISCHARGE OR ITCHING   Items with * indicate a potential emergency and should be followed up as soon as possible or go to the Emergency Department if any problems should occur.  Please show the CHEMOTHERAPY ALERT CARD  or IMMUNOTHERAPY ALERT CARD at check-in to the Emergency Department and triage nurse.  Should you have questions after your visit or need to cancel or reschedule your appointment, please contact [facility] - A DEPT OF [facility]  Dept: [phone]  and follow the prompts.  Office hours are 8:00 a.m. to 4:30 p.m. Monday - Friday. Please note that voicemails left after 4:00 p.m. may not be returned until the following business day.  We are closed weekends and major holidays. You have access to a nurse at all times for urgent questions. Please call the main number to the clinic Dept: [phone] and follow the prompts.   For any non-urgent questions, you may also contact your provider using MyChart. We now offer e-Visits for anyone 42 and older to request care online for non-urgent symptoms. For details visit [email or web address].   Also download the MyChart app! Go to the app store, search MyChart, open the app, select [place], and log in with your MyChart username and password.

## 2024-04-12 NOTE — Progress Notes (Signed)
 Patient seen by Lacie Burton, NP today  Vitals are within treatment parameters:Yes   Labs are within treatment parameters: No (Please specify and give further instructions.) ANC 1.6--OK to proceed  Treatment plan has been signed: Yes   Per physician team, Patient is ready for treatment and there are NO modifications to the treatment plan.

## 2024-04-17 ENCOUNTER — Other Ambulatory Visit: Payer: Self-pay

## 2024-04-19 ENCOUNTER — Inpatient Hospital Stay

## 2024-04-19 ENCOUNTER — Inpatient Hospital Stay (HOSPITAL_BASED_OUTPATIENT_CLINIC_OR_DEPARTMENT_OTHER): Admitting: Nurse Practitioner

## 2024-04-19 ENCOUNTER — Encounter: Payer: Self-pay | Admitting: Nurse Practitioner

## 2024-04-19 VITALS — BP 109/64 | HR 66 | Temp 97.6°F | Resp 18 | Ht 65.0 in | Wt 185.3 lb

## 2024-04-19 VITALS — BP 159/75 | HR 56 | Temp 97.6°F | Resp 16

## 2024-04-19 DIAGNOSIS — Z800 Family history of malignant neoplasm of digestive organs: Secondary | ICD-10-CM

## 2024-04-19 DIAGNOSIS — C184 Malignant neoplasm of transverse colon: Secondary | ICD-10-CM | POA: Diagnosis not present

## 2024-04-19 DIAGNOSIS — I10 Essential (primary) hypertension: Secondary | ICD-10-CM

## 2024-04-19 DIAGNOSIS — Z86718 Personal history of other venous thrombosis and embolism: Secondary | ICD-10-CM

## 2024-04-19 DIAGNOSIS — Z860100 Personal history of colon polyps, unspecified: Secondary | ICD-10-CM

## 2024-04-19 DIAGNOSIS — N2889 Other specified disorders of kidney and ureter: Secondary | ICD-10-CM

## 2024-04-19 DIAGNOSIS — Z86711 Personal history of pulmonary embolism: Secondary | ICD-10-CM

## 2024-04-19 DIAGNOSIS — E278 Other specified disorders of adrenal gland: Secondary | ICD-10-CM

## 2024-04-19 DIAGNOSIS — J45909 Unspecified asthma, uncomplicated: Secondary | ICD-10-CM

## 2024-04-19 DIAGNOSIS — Z7901 Long term (current) use of anticoagulants: Secondary | ICD-10-CM

## 2024-04-19 DIAGNOSIS — Z8619 Personal history of other infectious and parasitic diseases: Secondary | ICD-10-CM

## 2024-04-19 DIAGNOSIS — Z5111 Encounter for antineoplastic chemotherapy: Secondary | ICD-10-CM | POA: Diagnosis not present

## 2024-04-19 DIAGNOSIS — E785 Hyperlipidemia, unspecified: Secondary | ICD-10-CM

## 2024-04-19 LAB — CMP (CANCER CENTER ONLY)
ALT: 13 U/L (ref 0–44)
AST: 18 U/L (ref 15–41)
Albumin: 4.5 g/dL (ref 3.5–5.0)
Alkaline Phosphatase: 87 U/L (ref 38–126)
Anion gap: 11 (ref 5–15)
BUN: 25 mg/dL — ABNORMAL HIGH (ref 8–23)
CO2: 24 mmol/L (ref 22–32)
Calcium: 10.4 mg/dL — ABNORMAL HIGH (ref 8.9–10.3)
Chloride: 107 mmol/L (ref 98–111)
Creatinine: 1.5 mg/dL — ABNORMAL HIGH (ref 0.44–1.00)
GFR, Estimated: 35 mL/min — ABNORMAL LOW (ref >60)
Glucose, Bld: 127 mg/dL — ABNORMAL HIGH (ref 70–99)
Potassium: 4.5 mmol/L (ref 3.5–5.1)
Sodium: 142 mmol/L (ref 135–145)
Total Bilirubin: 0.3 mg/dL (ref 0.0–1.2)
Total Protein: 7.1 g/dL (ref 6.5–8.1)

## 2024-04-19 LAB — CBC WITH DIFFERENTIAL (CANCER CENTER ONLY)
Abs Immature Granulocytes: 0.01 K/uL (ref 0.00–0.07)
Basophils Absolute: 0 K/uL (ref 0.0–0.1)
Basophils Relative: 1 %
Eosinophils Absolute: 0.2 K/uL (ref 0.0–0.5)
Eosinophils Relative: 4 %
HCT: 34.8 % — ABNORMAL LOW (ref 36.0–46.0)
Hemoglobin: 11.7 g/dL — ABNORMAL LOW (ref 12.0–15.0)
Immature Granulocytes: 0 %
Lymphocytes Relative: 36 %
Lymphs Abs: 1.3 K/uL (ref 0.7–4.0)
MCH: 32.6 pg (ref 26.0–34.0)
MCHC: 33.6 g/dL (ref 30.0–36.0)
MCV: 96.9 fL (ref 80.0–100.0)
Monocytes Absolute: 0.3 K/uL (ref 0.1–1.0)
Monocytes Relative: 9 %
Neutro Abs: 1.9 K/uL (ref 1.7–7.7)
Neutrophils Relative %: 50 %
Platelet Count: 164 K/uL (ref 150–400)
RBC: 3.59 MIL/uL — ABNORMAL LOW (ref 3.87–5.11)
RDW: 18.2 % — ABNORMAL HIGH (ref 11.5–15.5)
WBC Count: 3.8 K/uL — ABNORMAL LOW (ref 4.0–10.5)
nRBC: 0 % (ref 0.0–0.2)

## 2024-04-19 MED ORDER — SODIUM CHLORIDE 0.9 % IV SOLN
200.0000 mg/m2 | Freq: Once | INTRAVENOUS | Status: AC
  Administered 2024-04-19: 370 mg via INTRAVENOUS
  Filled 2024-04-19: qty 18.5

## 2024-04-19 MED ORDER — PROCHLORPERAZINE MALEATE 10 MG PO TABS
5.0000 mg | ORAL_TABLET | Freq: Once | ORAL | Status: AC
  Administered 2024-04-19: 5 mg via ORAL
  Filled 2024-04-19: qty 1

## 2024-04-19 MED ORDER — SODIUM CHLORIDE 0.9 % IV SOLN: INTRAVENOUS | Status: DC

## 2024-04-19 MED ORDER — FLUOROURACIL CHEMO INJECTION 500 MG/10ML
200.0000 mg/m2 | Freq: Once | INTRAVENOUS | Status: AC
  Administered 2024-04-19: 350 mg via INTRAVENOUS
  Filled 2024-04-19: qty 7

## 2024-04-19 NOTE — Patient Instructions (Signed)

## 2024-04-19 NOTE — Progress Notes (Signed)
 [facility] OFFICE PROGRESS NOTE   Diagnosis: Colon cancer  INTERVAL HISTORY:   Adrienne Rogers returns as scheduled.  She is completing a cycle of 5-FU/leucovorin  per the [place] regimen.  She completed cycle 2-week 5 04/12/2024.  She denies nausea/vomiting.  No mouth sores.  No diarrhea.  No hand or foot pain or redness.  No rash.  No dysphagia.  She has a good appetite.  No abdominal pain.  No dizziness.  Usual fatigue following chemotherapy.  Objective:  Vital signs in last 24 hours:  Blood pressure 109/64, pulse 66, temperature 97.6 F (36.4 C), temperature source Temporal, resp. rate 18, height 5' 5 (1.651 m), weight 185 lb 4.8 oz (84.1 kg), SpO2 98%.    HEENT: No thrush or ulcers. Resp: Lungs clear bilaterally. Cardio: Regular rate and rhythm. GI: No hepatosplenomegaly.  Nontender. Vascular: No leg edema. Skin: Palms without erythema. Port-A-Cath without erythema.  Lab Results:  Lab Results  Component Value Date   WBC 3.8 (L) 04/19/2024   HGB 11.7 (L) 04/19/2024   HCT 34.8 (L) 04/19/2024   MCV 96.9 04/19/2024   PLT 164 04/19/2024   NEUTROABS 1.9 04/19/2024    Imaging:  No results found.  Medications: I have reviewed the patient's current medications.  Assessment/Plan: Transverse colon cancer, stage IIIb (pT3,pN1b,cM0), status post a segmental transverse colectomy 08/03/2023 CT abdomen/pelvis 07/22/2023: Proximal colonic distention with transition point in the transverse colon, 4 cm lesion of the distal transverse colon with evidence of transmural extension/serosal component, small lymph nodes in the transverse mesocolon, indeterminate 1.9 cm left adrenal nodule Colonoscopy 07/22/2023: Mass in the ascending colon biopsy-invasive moderately differentiated adenocarcinoma CT chest 08/02/2023: No evidence of metastatic disease, indeterminate left adrenal nodule Segmental transverse colectomy and colocolonic anastomosis 08/03/2023-moderately  differentiated adenocarcinoma of the transverse colon, tumor extends into pericolonic adipose tissue, lymphovascular invasion present, 2/13 nodes Mismatch repair protein expression intact, MSI stable Elevated preoperative CEA 08/02/2023 (42.7) 08/31/2023 CEA normal (4.56) Cycle 1 capecitabine  09/08/2023 Cycle 2 capecitabine  09/29/2023 Cycle 3 capecitabine  10/20/2023, capecitabine  dose reduced to 1000 mg twice daily Cycle 4 capecitabine  11/18/2023, capecitabine  dose reduced to 1000 mg twice daily Cycle 5 capecitabine  12/16/2023, capecitabine  dose reduced to 1000 mg twice daily, discontinued day 8 due to recurrent dizziness Cycle 1 Week one 5-FU/leucovorin  per the [place] regimen 01/12/2024 Week two 5-FU/leucovorin  01/19/2024 Week three 5-FU/leucovorin , 5-FU dose reduced for neutropenia 01/26/2024 Treatment held 02/02/2024 and 02/09/2024 due to neutropenia Week four 5-FU/leucovorin  02/16/2024 Week five 5-FU /leucovorin  02/23/2024 Week six 5-FU/leucovorin  03/01/2024 Cycle 2 week one 5-FU/leucovorin  per [place] regimen 03/15/24 Week two 5-FU/leuc 03/22/2024 Week three 5-FU/leuc 03/29/2024 Week four 5-FU/leuc 04/05/2024 Week five 5-FU/leuc 04/12/2024 Week six 5-FU/leucovorin  04/19/2024   Indeterminate left adrenal nodule on CT abdomen/pelvis 07/22/2023 History of colonic polyps Hypertension Hyperlipidemia Asthma History of H. 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  She is tolerating treatment well.  Plan to proceed with cycle 2-week six 5-FU/leucovorin  per [facility] regimen today as scheduled.  This will complete the course of  adjuvant chemotherapy.  We had preliminary discussion regarding the surveillance phase.  CBC and chemistry panel reviewed.  Labs adequate for  treatment.  She will return for follow-up in approximately 4 weeks.  We are available to see her sooner if needed.    Olam Ned ANP/GNP-BC   04/19/2024  11:12 AM

## 2024-04-19 NOTE — Patient Instructions (Signed)
 [facility] - A DEPT OF [facility]  Discharge Instructions: Thank you for choosing [facility] to provide your oncology and hematology care.   If you have a lab appointment with the Cancer Center, please go directly to the Cancer Center and check in at the registration area.   Wear comfortable clothing and clothing appropriate for easy access to any Portacath or PICC line.   We strive to give you quality time with your provider. You may need to reschedule your appointment if you arrive late (15 or more minutes).  Arriving late affects you and other patients whose appointments are after yours.  Also, if you miss three or more appointments without notifying the office, you may be dismissed from the clinic at the provider's discretion.      For prescription refill requests, have your pharmacy contact our office and allow 72 hours for refills to be completed.    Today you received the following chemotherapy and/or immunotherapy agents: leucovorin , fluorouracil        To help prevent nausea and vomiting after your treatment, we encourage you to take your nausea medication as directed.  BELOW ARE SYMPTOMS THAT SHOULD BE REPORTED IMMEDIATELY: *FEVER GREATER THAN 100.4 F (38 C) OR HIGHER *CHILLS OR SWEATING *NAUSEA AND VOMITING THAT IS NOT CONTROLLED WITH YOUR NAUSEA MEDICATION *UNUSUAL SHORTNESS OF BREATH *UNUSUAL BRUISING OR BLEEDING *URINARY PROBLEMS (pain or burning when urinating, or frequent urination) *BOWEL PROBLEMS (unusual diarrhea, constipation, pain near the anus) TENDERNESS IN MOUTH AND THROAT WITH OR WITHOUT PRESENCE OF ULCERS (sore throat, sores in mouth, or a toothache) UNUSUAL RASH, SWELLING OR PAIN  UNUSUAL VAGINAL DISCHARGE OR ITCHING   Items with * indicate a potential emergency and should be followed up as soon as possible or go to the Emergency Department if any problems should occur.  Please show the CHEMOTHERAPY ALERT CARD  or IMMUNOTHERAPY ALERT CARD at check-in to the Emergency Department and triage nurse.  Should you have questions after your visit or need to cancel or reschedule your appointment, please contact [facility] - A DEPT OF [facility]  Dept: [phone]  and follow the prompts.  Office hours are 8:00 a.m. to 4:30 p.m. Monday - Friday. Please note that voicemails left after 4:00 p.m. may not be returned until the following business day.  We are closed weekends and major holidays. You have access to a nurse at all times for urgent questions. Please call the main number to the clinic Dept: [phone] and follow the prompts.   For any non-urgent questions, you may also contact your provider using MyChart. We now offer e-Visits for anyone 42 and older to request care online for non-urgent symptoms. For details visit [email or web address].   Also download the MyChart app! Go to the app store, search MyChart, open the app, select [place], and log in with your MyChart username and password.

## 2024-04-19 NOTE — Progress Notes (Signed)
 Patient seen by Olam Ned NP today  Vitals are within treatment parameters:Yes   Labs are within treatment parameters: Yes Creatinine 1.5 it's okay to proceed   Treatment plan has been signed: Yes   Per physician team, Patient is ready for treatment and there are NO modifications to the treatment plan.

## 2024-05-22 ENCOUNTER — Inpatient Hospital Stay: Attending: Oncology

## 2024-05-22 ENCOUNTER — Inpatient Hospital Stay

## 2024-05-22 ENCOUNTER — Encounter: Payer: Self-pay | Admitting: Oncology

## 2024-05-22 ENCOUNTER — Inpatient Hospital Stay (HOSPITAL_BASED_OUTPATIENT_CLINIC_OR_DEPARTMENT_OTHER): Admitting: Oncology

## 2024-05-22 VITALS — BP 103/75 | HR 66 | Temp 97.8°F | Resp 18 | Ht 65.0 in | Wt 187.5 lb

## 2024-05-22 DIAGNOSIS — E785 Hyperlipidemia, unspecified: Secondary | ICD-10-CM | POA: Insufficient documentation

## 2024-05-22 DIAGNOSIS — Z86718 Personal history of other venous thrombosis and embolism: Secondary | ICD-10-CM | POA: Diagnosis not present

## 2024-05-22 DIAGNOSIS — D709 Neutropenia, unspecified: Secondary | ICD-10-CM | POA: Diagnosis not present

## 2024-05-22 DIAGNOSIS — C184 Malignant neoplasm of transverse colon: Secondary | ICD-10-CM

## 2024-05-22 DIAGNOSIS — Z860100 Personal history of colon polyps, unspecified: Secondary | ICD-10-CM | POA: Insufficient documentation

## 2024-05-22 DIAGNOSIS — Z800 Family history of malignant neoplasm of digestive organs: Secondary | ICD-10-CM

## 2024-05-22 DIAGNOSIS — Z8619 Personal history of other infectious and parasitic diseases: Secondary | ICD-10-CM | POA: Insufficient documentation

## 2024-05-22 DIAGNOSIS — I10 Essential (primary) hypertension: Secondary | ICD-10-CM

## 2024-05-22 DIAGNOSIS — Z7901 Long term (current) use of anticoagulants: Secondary | ICD-10-CM

## 2024-05-22 DIAGNOSIS — J45909 Unspecified asthma, uncomplicated: Secondary | ICD-10-CM

## 2024-05-22 DIAGNOSIS — N289 Disorder of kidney and ureter, unspecified: Secondary | ICD-10-CM | POA: Diagnosis not present

## 2024-05-22 DIAGNOSIS — E278 Other specified disorders of adrenal gland: Secondary | ICD-10-CM | POA: Diagnosis not present

## 2024-05-22 DIAGNOSIS — E279 Disorder of adrenal gland, unspecified: Secondary | ICD-10-CM | POA: Diagnosis not present

## 2024-05-22 DIAGNOSIS — Z9049 Acquired absence of other specified parts of digestive tract: Secondary | ICD-10-CM

## 2024-05-22 DIAGNOSIS — Z86711 Personal history of pulmonary embolism: Secondary | ICD-10-CM | POA: Diagnosis not present

## 2024-05-22 LAB — CBC WITH DIFFERENTIAL (CANCER CENTER ONLY)
Abs Immature Granulocytes: 0.02 K/uL (ref 0.00–0.07)
Basophils Absolute: 0 K/uL (ref 0.0–0.1)
Basophils Relative: 1 %
Eosinophils Absolute: 0.1 K/uL (ref 0.0–0.5)
Eosinophils Relative: 3 %
HCT: 35.5 % — ABNORMAL LOW (ref 36.0–46.0)
Hemoglobin: 11.8 g/dL — ABNORMAL LOW (ref 12.0–15.0)
Immature Granulocytes: 0 %
Lymphocytes Relative: 32 %
Lymphs Abs: 1.6 K/uL (ref 0.7–4.0)
MCH: 32.9 pg (ref 26.0–34.0)
MCHC: 33.2 g/dL (ref 30.0–36.0)
MCV: 98.9 fL (ref 80.0–100.0)
Monocytes Absolute: 0.5 K/uL (ref 0.1–1.0)
Monocytes Relative: 10 %
Neutro Abs: 2.7 K/uL (ref 1.7–7.7)
Neutrophils Relative %: 54 %
Platelet Count: 197 K/uL (ref 150–400)
RBC: 3.59 MIL/uL — ABNORMAL LOW (ref 3.87–5.11)
RDW: 14.6 % (ref 11.5–15.5)
WBC Count: 4.9 K/uL (ref 4.0–10.5)
nRBC: 0 % (ref 0.0–0.2)

## 2024-05-22 LAB — CEA (ACCESS): CEA (CHCC): 3.14 ng/mL (ref 0.00–5.00)

## 2024-05-22 LAB — CMP (CANCER CENTER ONLY)
ALT: 21 U/L (ref 0–44)
AST: 20 U/L (ref 15–41)
Albumin: 4.2 g/dL (ref 3.5–5.0)
Alkaline Phosphatase: 86 U/L (ref 38–126)
Anion gap: 10 (ref 5–15)
BUN: 21 mg/dL (ref 8–23)
CO2: 26 mmol/L (ref 22–32)
Calcium: 10.2 mg/dL (ref 8.9–10.3)
Chloride: 105 mmol/L (ref 98–111)
Creatinine: 1.48 mg/dL — ABNORMAL HIGH (ref 0.44–1.00)
GFR, Estimated: 35 mL/min — ABNORMAL LOW (ref >60)
Glucose, Bld: 123 mg/dL — ABNORMAL HIGH (ref 70–99)
Potassium: 4.5 mmol/L (ref 3.5–5.1)
Sodium: 141 mmol/L (ref 135–145)
Total Bilirubin: 0.3 mg/dL (ref 0.0–1.2)
Total Protein: 7.3 g/dL (ref 6.5–8.1)

## 2024-05-22 NOTE — Progress Notes (Signed)
 [facility] OFFICE PROGRESS NOTE   Diagnosis: Colon cancer  INTERVAL HISTORY:   Adrienne Rogers returns as scheduled.  She completed a final cycle of adjuvant 5-FU/leucovorin  on 04/19/2024.  She feels well.  No complaint.  A Port-A-Cath remains in place.  No nausea/vomiting, diarrhea, or hand/foot pain.  Objective:  Vital signs in last 24 hours:  Blood pressure 103/75, pulse 66, temperature 97.8 F (36.6 C), temperature source Temporal, resp. rate 18, height 5' 5 (1.651 m), weight 187 lb 8 oz (85 kg), SpO2 100%.    HEENT: No thrush or ulcers Lymphatics: No cervical, supraclavicular, axillary, or inguinal nodes Resp: Distant breath sounds, no respiratory distress Cardio: Regular rate and rhythm GI: No hepatosplenomegaly, no mass, nontender Vascular: No leg edema  Skin: Palms without erythema  Portacath/PICC-without erythema  Lab Results:  Lab Results  Component Value Date   WBC 4.9 05/22/2024   HGB 11.8 (L) 05/22/2024   HCT 35.5 (L) 05/22/2024   MCV 98.9 05/22/2024   PLT 197 05/22/2024   NEUTROABS 2.7 05/22/2024    CMP  Lab Results  Component Value Date   NA 142 04/19/2024   K 4.5 04/19/2024   CL 107 04/19/2024   CO2 24 04/19/2024   GLUCOSE 127 (H) 04/19/2024   BUN 25 (H) 04/19/2024   CREATININE 1.50 (H) 04/19/2024   CALCIUM  10.4 (H) 04/19/2024   PROT 7.1 04/19/2024   ALBUMIN 4.5 04/19/2024   AST 18 04/19/2024   ALT 13 04/19/2024   ALKPHOS 87 04/19/2024   BILITOT 0.3 04/19/2024   GFRNONAA 35 (L) 04/19/2024   GFRAA 45 (L) 03/09/2018    Lab Results  Component Value Date   CEA1 42.7 (H) 08/02/2023   CEA 4.39 04/05/2024     Medications: I have reviewed the patient's current medications.   Assessment/Plan: Transverse colon cancer, stage IIIb (pT3,pN1b,cM0), status post a segmental transverse colectomy 08/03/2023 CT abdomen/pelvis 07/22/2023: Proximal colonic distention with transition point in the transverse colon, 4 cm lesion of the  distal transverse colon with evidence of transmural extension/serosal component, small lymph nodes in the transverse mesocolon, indeterminate 1.9 cm left adrenal nodule Colonoscopy 07/22/2023: Mass in the ascending colon biopsy-invasive moderately differentiated adenocarcinoma CT chest 08/02/2023: No evidence of metastatic disease, indeterminate left adrenal nodule Segmental transverse colectomy and colocolonic anastomosis 08/03/2023-moderately differentiated adenocarcinoma of the transverse colon, tumor extends into pericolonic adipose tissue, lymphovascular invasion present, 2/13 nodes Mismatch repair protein expression intact, MSI stable Elevated preoperative CEA 08/02/2023 (42.7) 08/31/2023 CEA normal (4.56) Cycle 1 capecitabine  09/08/2023 Cycle 2 capecitabine  09/29/2023 Cycle 3 capecitabine  10/20/2023, capecitabine  dose reduced to 1000 mg twice daily Cycle 4 capecitabine  11/18/2023, capecitabine  dose reduced to 1000 mg twice daily Cycle 5 capecitabine  12/16/2023, capecitabine  dose reduced to 1000 mg twice daily, discontinued day 8 due to recurrent dizziness Cycle 1 Week one 5-FU/leucovorin  per the [place] regimen 01/12/2024 Week two 5-FU/leucovorin  01/19/2024 Week three 5-FU/leucovorin , 5-FU dose reduced for neutropenia 01/26/2024 Treatment held 02/02/2024 and 02/09/2024 due to neutropenia Week four 5-FU/leucovorin  02/16/2024 Week five 5-FU /leucovorin  02/23/2024 Week six 5-FU/leucovorin  03/01/2024 Cycle 2 week one 5-FU/leucovorin  per [place] regimen 03/15/24 Week two 5-FU/leuc 03/22/2024 Week three 5-FU/leuc 03/29/2024 Week four 5-FU/leuc 04/05/2024 Week five 5-FU/leuc 04/12/2024 Week six 5-FU/leucovorin  04/19/2024   Indeterminate left adrenal nodule on CT abdomen/pelvis 07/22/2023 History of colonic polyps Hypertension Hyperlipidemia Asthma History of H. 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  She completed a course of adjuvant 5-fluorouracil  based chemotherapy last month.  She is in clinical remission.  We will follow-up on the CEA from today.  She will return for an office visit and surveillance CTs in 08/22/2023.  A Port-A-Cath remains in place.  She underwent a partial transverse colectomy.  The colon tumor could not be passed on the February 17, 2025colonoscopy.  We will refer her to Dr. Kristie for a completion colonoscopy.  Arley Hof, MD  05/22/2024  2:53 PM

## 2024-05-24 ENCOUNTER — Other Ambulatory Visit: Payer: Self-pay

## 2024-07-06 ENCOUNTER — Telehealth: Payer: Self-pay | Admitting: *Deleted

## 2024-07-06 NOTE — Telephone Encounter (Signed)
 Called Adrienne Rogers with 07/17/24 port flush/lab and CT scan. Arrive in CT at 1230 to start oral contrast.

## 2024-07-11 ENCOUNTER — Telehealth: Payer: Self-pay | Admitting: *Deleted

## 2024-07-11 NOTE — Telephone Encounter (Signed)
 Adrienne Rogers called to ask Dr. Cloretta if she can hold her Eliquis  for 10 days for procedure by Dr. Kristie. Informed patient that Dr. Nola office will fax a form for Dr. Cloretta to complete to give clearance as it is not done verbally.

## 2024-07-13 ENCOUNTER — Encounter: Payer: Self-pay | Admitting: *Deleted

## 2024-07-13 NOTE — Progress Notes (Signed)
 Faxed clearance to hold Eliquis  to Dr. Renaye Sous [phone]

## 2024-07-16 ENCOUNTER — Encounter: Payer: Self-pay | Admitting: Oncology

## 2024-07-17 ENCOUNTER — Ambulatory Visit (HOSPITAL_BASED_OUTPATIENT_CLINIC_OR_DEPARTMENT_OTHER)
Admission: RE | Admit: 2024-07-17 | Discharge: 2024-07-17 | Disposition: A | Discharge status: Home / Self Care | Source: Ambulatory Visit | Attending: Oncology | Admitting: Oncology

## 2024-07-17 ENCOUNTER — Inpatient Hospital Stay: Attending: Oncology

## 2024-07-17 ENCOUNTER — Inpatient Hospital Stay

## 2024-07-17 ENCOUNTER — Ambulatory Visit: Admission: RE | Admit: 2024-07-17 | Source: Ambulatory Visit

## 2024-07-17 DIAGNOSIS — C184 Malignant neoplasm of transverse colon: Secondary | ICD-10-CM

## 2024-07-17 LAB — CEA (ACCESS): CEA (CHCC): 2.44 ng/mL (ref 0.00–5.00)

## 2024-07-17 NOTE — Patient Instructions (Signed)

## 2024-07-24 ENCOUNTER — Inpatient Hospital Stay: Admitting: Oncology

## 2024-07-25 ENCOUNTER — Telehealth: Payer: Self-pay | Admitting: Oncology

## 2024-07-25 NOTE — Telephone Encounter (Signed)
 PT called to reschedule appt;date and time confirmed.

## 2024-07-26 ENCOUNTER — Other Ambulatory Visit: Payer: Self-pay

## 2024-08-07 ENCOUNTER — Inpatient Hospital Stay: Attending: Oncology | Admitting: Oncology

## 2024-08-07 VITALS — BP 103/71 | HR 65 | Temp 97.8°F | Resp 18 | Ht 65.0 in | Wt 191.6 lb

## 2024-08-07 DIAGNOSIS — C184 Malignant neoplasm of transverse colon: Secondary | ICD-10-CM

## 2024-08-08 ENCOUNTER — Other Ambulatory Visit: Payer: Self-pay

## 2024-09-04 ENCOUNTER — Inpatient Hospital Stay

## 2025-01-08 ENCOUNTER — Inpatient Hospital Stay

## 2025-01-08 ENCOUNTER — Inpatient Hospital Stay: Admitting: Oncology
